# Patient Record
Sex: Female | Born: 1935
Health system: Southern US, Community
[De-identification: ages and names within clinical notes are randomized; demographics above are authoritative.]

## PROBLEM LIST (undated history)

## (undated) DIAGNOSIS — E785 Hyperlipidemia, unspecified: Secondary | ICD-10-CM

## (undated) DIAGNOSIS — M1990 Unspecified osteoarthritis, unspecified site: Secondary | ICD-10-CM

## (undated) DIAGNOSIS — I10 Essential (primary) hypertension: Secondary | ICD-10-CM

## (undated) DIAGNOSIS — I4891 Unspecified atrial fibrillation: Secondary | ICD-10-CM

## (undated) HISTORY — PX: OTHER SURGICAL HISTORY: SHX169

## (undated) HISTORY — PX: SPINE SURGERY: SHX786

## (undated) HISTORY — PX: BUNIONECTOMY: SHX129

## (undated) HISTORY — PX: CARPAL TUNNEL RELEASE: SHX101

## (undated) HISTORY — PX: TONSILLECTOMY: SUR1361

## (undated) HISTORY — PX: BREAST EXCISIONAL BIOPSY: SUR124

## (undated) HISTORY — PX: ROTATOR CUFF REPAIR: SHX139

## (undated) HISTORY — PX: BREAST BIOPSY: SHX20

## (undated) HISTORY — PX: BACK SURGERY: SHX140

## (undated) HISTORY — PX: KNEE SURGERY: SHX244

## (undated) HISTORY — PX: TOTAL KNEE ARTHROPLASTY: SHX125

---

## 2017-09-27 LAB — BASIC METABOLIC PANEL
BUN: 21 (ref 4–21)
Creatinine: 0.6 (ref 0.5–1.1)
Glucose: 88
Potassium: 3.8 (ref 3.4–5.3)
Sodium: 139 (ref 137–147)

## 2017-09-27 LAB — CBC AND DIFFERENTIAL
HCT: 43 (ref 36–46)
Hemoglobin: 13.9 (ref 12.0–16.0)
Platelets: 249 (ref 150–399)
WBC: 6.2

## 2017-09-27 LAB — HEPATIC FUNCTION PANEL
ALT: 21 (ref 7–35)
AST: 25 (ref 13–35)
Alkaline Phosphatase: 71 (ref 25–125)
Bilirubin, Total: 1.1

## 2017-09-27 LAB — LIPID PANEL
Cholesterol: 311 — AB (ref 0–200)
HDL: 85 — AB (ref 35–70)
LDL Cholesterol: 197
Triglycerides: 147 (ref 40–160)

## 2019-03-17 ENCOUNTER — Telehealth: Payer: Self-pay

## 2019-03-17 NOTE — Telephone Encounter (Signed)

## 2019-03-20 ENCOUNTER — Other Ambulatory Visit: Payer: Self-pay

## 2019-03-20 ENCOUNTER — Encounter: Payer: Self-pay | Admitting: Family Medicine

## 2019-03-20 ENCOUNTER — Ambulatory Visit (INDEPENDENT_AMBULATORY_CARE_PROVIDER_SITE_OTHER): Payer: Medicare Other | Admitting: Family Medicine

## 2019-03-20 VITALS — BP 136/80 | HR 96 | Ht 65.0 in | Wt 177.0 lb

## 2019-03-20 DIAGNOSIS — R8290 Unspecified abnormal findings in urine: Secondary | ICD-10-CM | POA: Diagnosis not present

## 2019-03-20 DIAGNOSIS — N390 Urinary tract infection, site not specified: Secondary | ICD-10-CM

## 2019-03-20 DIAGNOSIS — I10 Essential (primary) hypertension: Secondary | ICD-10-CM | POA: Insufficient documentation

## 2019-03-20 DIAGNOSIS — E7800 Pure hypercholesterolemia, unspecified: Secondary | ICD-10-CM | POA: Insufficient documentation

## 2019-03-20 MED ORDER — CHLORTHALIDONE 25 MG PO TABS: 12.5000 mg | ORAL_TABLET | Freq: Every day | ORAL | 1 refills | Status: DC

## 2019-03-20 MED ORDER — ATENOLOL 25 MG PO TABS: 25.0000 mg | ORAL_TABLET | Freq: Two times a day (BID) | ORAL | 1 refills | Status: DC

## 2019-03-20 NOTE — Progress Notes (Addendum)
Established Patient Office Visit  Subjective:  Patient ID: Kerry Coleman, female    DOB: Nov 18, 1934  Age: 84 y.o. MRN: [id number]  CC:  Chief Complaint  Patient presents with  . Establish Care    HPI Kerry Coleman presents for establishment of care.  She and her husband moved down here from [place] first part of August to be closer to their children and grandchildren.  Patient enjoys good health and continues to live a healthy lifestyle.  She is walking 2 miles daily.  She does not smoke or use illicit drugs.  Past medical history of hypertension treated with atenolol 25 mg daily and 12.5 mg of HCTZ.  Patient has undergone multiple orthopedic procedures including double knee replacements. History reviewed. No pertinent past medical history.  Past Surgical History:  Procedure Laterality Date  . BACK SURGERY    . BREAST BIOPSY    . BREAST LUMPECTOMY    . BUNIONECTOMY    . KNEE SURGERY    . right carpal tunnel release    . right index finger excision of mass    . ROTATOR CUFF REPAIR    . SPINE SURGERY    . TONSILLECTOMY      Family History  Problem Relation Age of Onset  . Heart disease Mother   . Stroke Mother   . Arthritis Father   . Heart disease Father   . Hyperlipidemia Sister   . Hypertension Sister   . Arthritis Sister   . Cancer Sister   . Hypertension Son   . Hyperlipidemia Son     Social History   Socioeconomic History  . Marital status: Married    Spouse name: Not on file  . Number of children: Not on file  . Years of education: Not on file  . Highest education level: Not on file  Occupational History  . Not on file  Social Needs  . Financial resource strain: Not on file  . Food insecurity    Worry: Not on file    Inability: Not on file  . Transportation needs    Medical: Not on file    Non-medical: Not on file  Tobacco Use  . Smoking status: Never Smoker  . Smokeless tobacco: Never Used  Substance and Sexual Activity  . Alcohol use: Yes     Alcohol/week: 7.0 standard drinks    Types: 7 Glasses of wine per week    Comment: 1 glass of red wine daily.   . Drug use: Never  . Sexual activity: Not on file  Lifestyle  . Physical activity    Days per week: Not on file    Minutes per session: Not on file  . Stress: Not on file  Relationships  . Social Musician on phone: Not on file    Gets together: Not on file    Attends religious service: Not on file    Active member of club or organization: Not on file    Attends meetings of clubs or organizations: Not on file    Relationship status: Not on file  . Intimate partner violence    Fear of current or ex partner: Not on file    Emotionally abused: Not on file    Physically abused: Not on file    Forced sexual activity: Not on file  Other Topics Concern  . Not on file  Social History Narrative  . Not on file    Outpatient Medications Prior to Visit  Medication  Sig Dispense Refill  . hydrochlorothiazide (HYDRODIURIL) 12.5 MG tablet Take 12.5 mg by mouth daily.     No facility-administered medications prior to visit.     No Known Allergies  ROS Review of Systems  Constitutional: Negative.   HENT: Negative.   Eyes: Negative for photophobia and visual disturbance.  Respiratory: Negative.   Cardiovascular: Negative.   Gastrointestinal: Negative.   Endocrine: Negative for polyphagia and polyuria.  Genitourinary: Negative.   Musculoskeletal: Negative.   Skin: Negative.   Allergic/Immunologic: Negative for immunocompromised state.  Neurological: Negative for tremors and speech difficulty.  Hematological: Negative.   Psychiatric/Behavioral: Negative.       Objective:    Physical Exam  Constitutional: She is oriented to person, place, and time. She appears well-developed and well-nourished. No distress.  HENT:  Head: Atraumatic.  Right Ear: External ear normal.  Left Ear: External ear normal.  Mouth/Throat: Oropharynx is clear and moist.  Eyes:  Pupils are equal, round, and reactive to light. Conjunctivae are normal. Right eye exhibits no discharge. Left eye exhibits no discharge. No scleral icterus.  Neck: No JVD present. No tracheal deviation present. No thyromegaly present.  Cardiovascular: Normal rate, regular rhythm and normal heart sounds.  Pulmonary/Chest: Effort normal and breath sounds normal. No stridor.  Lymphadenopathy:    She has no cervical adenopathy.  Neurological: She is alert and oriented to person, place, and time.  Skin: Skin is warm and dry. She is not diaphoretic.  Psychiatric: She has a normal mood and affect. Her behavior is normal.    BP 136/80   Pulse 96   Ht 5\' 5"  (1.651 m)   Wt 177 lb (80.3 kg)   SpO2 96%   BMI 29.45 kg/m  Wt Readings from Last 3 Encounters:  03/20/19 177 lb (80.3 kg)   BP Readings from Last 3 Encounters:  03/20/19 136/80   Guideline developer:  UpToDate (see UpToDate for funding source) Date Released: June 2014  Health Maintenance Due  Topic Date Due  . DEXA SCAN  09/15/2001    There are no preventive care reminders to display for this patient.  No results found for: TSH Lab Results  Component Value Date   WBC 6.2 03/22/2019   HGB 14.2 03/22/2019   HCT 42.6 03/22/2019   MCV 96.0 03/22/2019   PLT 236.0 03/22/2019   Lab Results  Component Value Date   NA 142 03/22/2019   K 3.9 03/22/2019   CO2 30 03/22/2019   GLUCOSE 91 03/22/2019   BUN 24 (H) 03/22/2019   CREATININE 0.68 03/22/2019   BILITOT 0.9 03/22/2019   ALKPHOS 67 03/22/2019   AST 23 03/22/2019   ALT 21 03/22/2019   PROT 7.0 03/22/2019   ALBUMIN 4.2 03/22/2019   CALCIUM 9.8 03/22/2019   GFR 82.73 03/22/2019   Lab Results  Component Value Date   CHOL 295 (H) 03/22/2019   Lab Results  Component Value Date   HDL 69.10 03/22/2019   Lab Results  Component Value Date   LDLCALC 196 (H) 03/22/2019   Lab Results  Component Value Date   TRIG 153.0 (H) 03/22/2019   Lab Results  Component Value  Date   CHOLHDL 4 03/22/2019   No results found for: HGBA1C The ASCVD Risk score Mikey Bussing DC Jr., et al., 2013) failed to calculate for the following reasons:   The 2013 ASCVD risk score is only valid for ages 98 to 1   Assessment & Plan:   Problem List Items Addressed This Visit  Cardiovascular and Mediastinum   Essential hypertension - Primary   Relevant Medications   atenolol (TENORMIN) 25 MG tablet   chlorthalidone (HYGROTON) 25 MG tablet   Other Relevant Orders   CBC (Completed)   Comprehensive metabolic panel (Completed)   Urinalysis, Routine w reflex microscopic (Completed)     Genitourinary   Urinary tract infection without hematuria   Relevant Medications   sulfamethoxazole-trimethoprim (BACTRIM DS) 800-160 MG tablet     Other   Elevated cholesterol   Relevant Medications   atenolol (TENORMIN) 25 MG tablet   chlorthalidone (HYGROTON) 25 MG tablet   Other Relevant Orders   Lipid panel (Completed)   Abnormal urinalysis   Relevant Orders   Urinalysis, Routine w reflex microscopic   Urine Culture (Completed)      Meds ordered this encounter  Medications  . atenolol (TENORMIN) 25 MG tablet    Sig: Take 1 tablet (25 mg total) by mouth 2 (two) times daily.    Dispense:  180 tablet    Refill:  1  . chlorthalidone (HYGROTON) 25 MG tablet    Sig: Take 0.5 tablets (12.5 mg total) by mouth daily.    Dispense:  30 tablet    Refill:  1  . sulfamethoxazole-trimethoprim (BACTRIM DS) 800-160 MG tablet    Sig: Take 1 tablet by mouth 2 (two) times daily for 10 days.    Dispense:  20 tablet    Refill:  0    Follow-up: Return in about 1 month (around 04/19/2019).   She will take atenolol twice daily and chlorthalidone one half daily.  Check and record her blood pressures on this new regimen.  Follow-up in 1 month.  Return fasting for above ordered blood work.

## 2019-03-21 ENCOUNTER — Telehealth: Payer: Self-pay

## 2019-03-21 NOTE — Telephone Encounter (Signed)

## 2019-03-22 ENCOUNTER — Other Ambulatory Visit: Payer: Self-pay

## 2019-03-22 ENCOUNTER — Other Ambulatory Visit (INDEPENDENT_AMBULATORY_CARE_PROVIDER_SITE_OTHER): Payer: Medicare Other

## 2019-03-22 DIAGNOSIS — I10 Essential (primary) hypertension: Secondary | ICD-10-CM | POA: Diagnosis not present

## 2019-03-22 DIAGNOSIS — E7800 Pure hypercholesterolemia, unspecified: Secondary | ICD-10-CM | POA: Diagnosis not present

## 2019-03-22 LAB — CBC
HCT: 42.6 % (ref 36.0–46.0)
Hemoglobin: 14.2 g/dL (ref 12.0–15.0)
MCHC: 33.2 g/dL (ref 30.0–36.0)
MCV: 96 fl (ref 78.0–100.0)
Platelets: 236 10*3/uL (ref 150.0–400.0)
RBC: 4.44 Mil/uL (ref 3.87–5.11)
RDW: 13.7 % (ref 11.5–15.5)
WBC: 6.2 10*3/uL (ref 4.0–10.5)

## 2019-03-22 LAB — COMPREHENSIVE METABOLIC PANEL
ALT: 21 U/L (ref 0–35)
AST: 23 U/L (ref 0–37)
Albumin: 4.2 g/dL (ref 3.5–5.2)
Alkaline Phosphatase: 67 U/L (ref 39–117)
BUN: 24 mg/dL — ABNORMAL HIGH (ref 6–23)
CO2: 30 mEq/L (ref 19–32)
Calcium: 9.8 mg/dL (ref 8.4–10.5)
Chloride: 104 mEq/L (ref 96–112)
Creatinine, Ser: 0.68 mg/dL (ref 0.40–1.20)
GFR: 82.73 mL/min (ref >60.00)
Glucose, Bld: 91 mg/dL (ref 70–99)
Potassium: 3.9 mEq/L (ref 3.5–5.1)
Sodium: 142 mEq/L (ref 135–145)
Total Bilirubin: 0.9 mg/dL (ref 0.2–1.2)
Total Protein: 7 g/dL (ref 6.0–8.3)

## 2019-03-22 LAB — URINALYSIS, ROUTINE W REFLEX MICROSCOPIC
Bilirubin Urine: NEGATIVE
Ketones, ur: NEGATIVE
Nitrite: POSITIVE — AB
Specific Gravity, Urine: 1.02 (ref 1.000–1.030)
Total Protein, Urine: NEGATIVE
Urine Glucose: NEGATIVE
Urobilinogen, UA: 0.2 (ref 0.0–1.0)
pH: 6.5 (ref 5.0–8.0)

## 2019-03-22 LAB — LIPID PANEL
Cholesterol: 295 mg/dL — ABNORMAL HIGH (ref 0–200)
HDL: 69.1 mg/dL (ref >39.00)
LDL Cholesterol: 196 mg/dL — ABNORMAL HIGH (ref 0–99)
NonHDL: 226.12
Total CHOL/HDL Ratio: 4
Triglycerides: 153 mg/dL — ABNORMAL HIGH (ref 0.0–149.0)
VLDL: 30.6 mg/dL (ref 0.0–40.0)

## 2019-03-23 DIAGNOSIS — R8290 Unspecified abnormal findings in urine: Secondary | ICD-10-CM | POA: Insufficient documentation

## 2019-03-23 NOTE — Addendum Note (Signed)
Addended by: Jon Billings on: 03/23/2019 09:20 AM   Modules accepted: Orders

## 2019-03-24 ENCOUNTER — Other Ambulatory Visit: Payer: Self-pay

## 2019-03-24 DIAGNOSIS — R8290 Unspecified abnormal findings in urine: Secondary | ICD-10-CM

## 2019-03-27 ENCOUNTER — Other Ambulatory Visit: Payer: Self-pay

## 2019-03-27 ENCOUNTER — Other Ambulatory Visit (INDEPENDENT_AMBULATORY_CARE_PROVIDER_SITE_OTHER): Payer: Medicare Other

## 2019-03-27 DIAGNOSIS — R8290 Unspecified abnormal findings in urine: Secondary | ICD-10-CM | POA: Diagnosis not present

## 2019-03-28 LAB — URINALYSIS, ROUTINE W REFLEX MICROSCOPIC
Bilirubin Urine: NEGATIVE
Ketones, ur: NEGATIVE
Nitrite: POSITIVE — AB
Specific Gravity, Urine: 1.02 (ref 1.000–1.030)
Total Protein, Urine: NEGATIVE
Urine Glucose: NEGATIVE
Urobilinogen, UA: 0.2 (ref 0.0–1.0)
pH: 6 (ref 5.0–8.0)

## 2019-03-29 ENCOUNTER — Encounter: Payer: Self-pay | Admitting: Family Medicine

## 2019-03-29 LAB — URINE CULTURE
MICRO NUMBER:: 954505
SPECIMEN QUALITY:: ADEQUATE

## 2019-03-31 DIAGNOSIS — R319 Hematuria, unspecified: Secondary | ICD-10-CM | POA: Insufficient documentation

## 2019-03-31 DIAGNOSIS — N390 Urinary tract infection, site not specified: Secondary | ICD-10-CM | POA: Insufficient documentation

## 2019-03-31 MED ORDER — SULFAMETHOXAZOLE-TRIMETHOPRIM 800-160 MG PO TABS: 1.0000 | ORAL_TABLET | Freq: Two times a day (BID) | ORAL | 0 refills | Status: AC

## 2019-03-31 NOTE — Addendum Note (Signed)
Addended by: Jon Billings on: 03/31/2019 11:57 AM   Modules accepted: Orders

## 2019-04-21 ENCOUNTER — Telehealth: Payer: Self-pay

## 2019-04-21 NOTE — Telephone Encounter (Signed)

## 2019-04-24 ENCOUNTER — Ambulatory Visit: Payer: Medicare Other | Admitting: Family Medicine

## 2019-04-27 ENCOUNTER — Ambulatory Visit (INDEPENDENT_AMBULATORY_CARE_PROVIDER_SITE_OTHER): Payer: Medicare Other | Admitting: Family Medicine

## 2019-04-27 ENCOUNTER — Encounter: Payer: Self-pay | Admitting: Family Medicine

## 2019-04-27 ENCOUNTER — Other Ambulatory Visit: Payer: Self-pay

## 2019-04-27 VITALS — BP 120/76 | HR 92 | Ht 65.0 in | Wt 179.0 lb

## 2019-04-27 DIAGNOSIS — Z1231 Encounter for screening mammogram for malignant neoplasm of breast: Secondary | ICD-10-CM

## 2019-04-27 DIAGNOSIS — Z85820 Personal history of malignant melanoma of skin: Secondary | ICD-10-CM

## 2019-04-27 DIAGNOSIS — I10 Essential (primary) hypertension: Secondary | ICD-10-CM | POA: Diagnosis not present

## 2019-04-27 DIAGNOSIS — Z0001 Encounter for general adult medical examination with abnormal findings: Secondary | ICD-10-CM | POA: Insufficient documentation

## 2019-04-27 DIAGNOSIS — Z0000 Encounter for general adult medical examination without abnormal findings: Secondary | ICD-10-CM

## 2019-04-27 DIAGNOSIS — Z01810 Encounter for preprocedural cardiovascular examination: Secondary | ICD-10-CM | POA: Insufficient documentation

## 2019-04-27 DIAGNOSIS — E7800 Pure hypercholesterolemia, unspecified: Secondary | ICD-10-CM | POA: Diagnosis not present

## 2019-04-27 DIAGNOSIS — E2839 Other primary ovarian failure: Secondary | ICD-10-CM

## 2019-04-27 DIAGNOSIS — M8588 Other specified disorders of bone density and structure, other site: Secondary | ICD-10-CM

## 2019-04-27 MED ORDER — CHLORTHALIDONE 25 MG PO TABS: 12.5000 mg | ORAL_TABLET | Freq: Every day | ORAL | 1 refills | Status: DC

## 2019-04-27 MED ORDER — PRAVASTATIN SODIUM 40 MG PO TABS: ORAL_TABLET | ORAL | 3 refills | Status: DC

## 2019-04-27 MED ORDER — ATENOLOL 25 MG PO TABS: 25.0000 mg | ORAL_TABLET | Freq: Two times a day (BID) | ORAL | 1 refills | Status: DC

## 2019-04-27 NOTE — Progress Notes (Signed)
Established Patient Office Visit  Subjective:  Patient ID: Kerry Coleman, female    DOB: September 26, 1934  Age: 84 y.o. MRN: [id number]  CC:  Chief Complaint  Patient presents with  . Follow-up    HPI Kerry BlamerMarilyn Coleman presents for follow-up of her hypertension status post changed to chlorthalidone. Blood pressures been running in the 1 20-1 40 range over the mid 60s to 70 range. She is having no issues taking the chlorthalidone. She was aware of her elevated LDL cholesterol was favorable HDL. She has tried multiple statins in the past and developed joint aches and pains while taking them. However she does admit that she has a history of multiple multiple arthritic joints and is not absolutely certain that the statin had caused a problem. Over the years she is let a healthy lifestyle with exercise healthy nutrition and she has not smoked. She has never had a vascular event. She has a history of melanoma requests a dermatological skin check.  History reviewed. No pertinent past medical history.  Past Surgical History:  Procedure Laterality Date  . BACK SURGERY    . BREAST BIOPSY    . BREAST LUMPECTOMY    . BUNIONECTOMY    . KNEE SURGERY    . right carpal tunnel release    . right index finger excision of mass    . ROTATOR CUFF REPAIR    . SPINE SURGERY    . TONSILLECTOMY      Family History  Problem Relation Age of Onset  . Heart disease Mother   . Stroke Mother   . Arthritis Father   . Heart disease Father   . Hyperlipidemia Sister   . Hypertension Sister   . Arthritis Sister   . Cancer Sister   . Hypertension Son   . Hyperlipidemia Son     Social History   Socioeconomic History  . Marital status: Married    Spouse name: Not on file  . Number of children: Not on file  . Years of education: Not on file  . Highest education level: Not on file  Occupational History  . Not on file  Social Needs  . Financial resource strain: Not on file  . Food insecurity    Worry: Not  on file    Inability: Not on file  . Transportation needs    Medical: Not on file    Non-medical: Not on file  Tobacco Use  . Smoking status: Never Smoker  . Smokeless tobacco: Never Used  Substance and Sexual Activity  . Alcohol use: Yes    Alcohol/week: 7.0 standard drinks    Types: 7 Glasses of wine per week    Comment: 1 glass of red wine daily.   . Drug use: Never  . Sexual activity: Not on file  Lifestyle  . Physical activity    Days per week: Not on file    Minutes per session: Not on file  . Stress: Not on file  Relationships  . Social Musicianconnections    Talks on phone: Not on file    Gets together: Not on file    Attends religious service: Not on file    Active member of club or organization: Not on file    Attends meetings of clubs or organizations: Not on file    Relationship status: Not on file  . Intimate partner violence    Fear of current or ex partner: Not on file    Emotionally abused: Not on file  Physically abused: Not on file    Forced sexual activity: Not on file  Other Topics Concern  . Not on file  Social History Narrative  . Not on file    Outpatient Medications Prior to Visit  Medication Sig Dispense Refill  . atenolol (TENORMIN) 25 MG tablet Take 1 tablet (25 mg total) by mouth 2 (two) times daily. 180 tablet 1  . chlorthalidone (HYGROTON) 25 MG tablet Take 0.5 tablets (12.5 mg total) by mouth daily. 30 tablet 1   No facility-administered medications prior to visit.     No Known Allergies  ROS Review of Systems  Constitutional: Negative.   HENT: Negative.   Respiratory: Negative.   Cardiovascular: Negative.   Gastrointestinal: Negative.   Endocrine: Negative for polyphagia and polyuria.  Genitourinary: Negative.   Musculoskeletal: Negative.   Skin: Negative for pallor.  Neurological: Negative.   Hematological: Negative.   Psychiatric/Behavioral: Negative.       Objective:    Physical Exam  Constitutional: She is oriented to  person, place, and time. She appears well-developed and well-nourished. No distress.  HENT:  Head: Normocephalic and atraumatic.  Right Ear: External ear normal.  Left Ear: External ear normal.  Mouth/Throat: Oropharynx is clear and moist. No oropharyngeal exudate.  Eyes: Pupils are equal, round, and reactive to light. Conjunctivae are normal. Right eye exhibits no discharge. Left eye exhibits no discharge. No scleral icterus.  Neck: Neck supple. No JVD present. No tracheal deviation present. No thyromegaly present.  Cardiovascular: Normal rate, regular rhythm and normal heart sounds.  Pulmonary/Chest: Effort normal and breath sounds normal. No stridor.  Musculoskeletal:        General: No edema.  Lymphadenopathy:    She has no cervical adenopathy.  Neurological: She is alert and oriented to person, place, and time.  Skin: Skin is warm and dry. She is not diaphoretic.  Psychiatric: She has a normal mood and affect. Her behavior is normal.    BP 120/76   Pulse 92   Ht 5\' 5"  (1.651 m)   Wt 179 lb (81.2 kg)   SpO2 97%   BMI 29.79 kg/m  Wt Readings from Last 3 Encounters:  04/27/19 179 lb (81.2 kg)  03/20/19 177 lb (80.3 kg)   BP Readings from Last 3 Encounters:  04/27/19 120/76  03/20/19 136/80   Guideline developer:  UpToDate (see UpToDate for funding source) Date Released: June 2014  Health Maintenance Due  Topic Date Due  . DEXA SCAN  09/15/2001    There are no preventive care reminders to display for this patient.   The ASCVD Risk score Mikey Bussing DC Jr., et al., 2013) failed to calculate for the following reasons:   The 2013 ASCVD risk score is only valid for ages 37 to 71  No results found for: TSH Lab Results  Component Value Date   WBC 6.2 03/22/2019   HGB 14.2 03/22/2019   HCT 42.6 03/22/2019   MCV 96.0 03/22/2019   PLT 236.0 03/22/2019   Lab Results  Component Value Date   NA 142 03/22/2019   K 3.9 03/22/2019   CO2 30 03/22/2019   GLUCOSE 91 03/22/2019    BUN 24 (H) 03/22/2019   CREATININE 0.68 03/22/2019   BILITOT 0.9 03/22/2019   ALKPHOS 67 03/22/2019   AST 23 03/22/2019   ALT 21 03/22/2019   PROT 7.0 03/22/2019   ALBUMIN 4.2 03/22/2019   CALCIUM 9.8 03/22/2019   GFR 82.73 03/22/2019   Lab Results  Component Value  Date   CHOL 295 (H) 03/22/2019   Lab Results  Component Value Date   HDL 69.10 03/22/2019   Lab Results  Component Value Date   LDLCALC 196 (H) 03/22/2019   Lab Results  Component Value Date   TRIG 153.0 (H) 03/22/2019   Lab Results  Component Value Date   CHOLHDL 4 03/22/2019   No results found for: HGBA1C    Assessment & Plan:   Problem List Items Addressed This Visit      Cardiovascular and Mediastinum   Essential hypertension - Primary   Relevant Medications   chlorthalidone (HYGROTON) 25 MG tablet   atenolol (TENORMIN) 25 MG tablet   pravastatin (PRAVACHOL) 40 MG tablet     Other   Elevated cholesterol   Relevant Medications   chlorthalidone (HYGROTON) 25 MG tablet   atenolol (TENORMIN) 25 MG tablet   pravastatin (PRAVACHOL) 40 MG tablet   History of melanoma   Relevant Orders   Ambulatory referral to Dermatology   Encounter for health maintenance examination with abnormal findings   Estrogen deficiency   Relevant Orders   DG Bone Density      Meds ordered this encounter  Medications  . chlorthalidone (HYGROTON) 25 MG tablet    Sig: Take 0.5 tablets (12.5 mg total) by mouth daily.    Dispense:  100 tablet    Refill:  1  . atenolol (TENORMIN) 25 MG tablet    Sig: Take 1 tablet (25 mg total) by mouth 2 (two) times daily.    Dispense:  180 tablet    Refill:  1  . pravastatin (PRAVACHOL) 40 MG tablet    Sig: Take one at night as directed.    Dispense:  90 tablet    Refill:  3    Follow-up: Return in about 6 months (around 10/25/2019).   She will get probably call a trial. She will take it once weekly at night for a week and then try to increase it to twice weekly. Recheck in 6  months.

## 2019-05-08 ENCOUNTER — Encounter (HOSPITAL_BASED_OUTPATIENT_CLINIC_OR_DEPARTMENT_OTHER): Payer: Self-pay

## 2019-05-08 ENCOUNTER — Ambulatory Visit (HOSPITAL_BASED_OUTPATIENT_CLINIC_OR_DEPARTMENT_OTHER)
Admission: RE | Admit: 2019-05-08 | Discharge: 2019-05-08 | Disposition: A | Discharge status: Home / Self Care | Payer: Medicare Other | Source: Ambulatory Visit | Attending: Family Medicine | Admitting: Family Medicine

## 2019-05-08 ENCOUNTER — Other Ambulatory Visit: Payer: Self-pay

## 2019-05-08 DIAGNOSIS — E2839 Other primary ovarian failure: Secondary | ICD-10-CM

## 2019-05-08 DIAGNOSIS — Z1231 Encounter for screening mammogram for malignant neoplasm of breast: Secondary | ICD-10-CM

## 2019-05-08 DIAGNOSIS — M8588 Other specified disorders of bone density and structure, other site: Secondary | ICD-10-CM | POA: Insufficient documentation

## 2019-05-08 MED ORDER — VITAMIN D-3 25 MCG (1000 UT) PO CAPS: ORAL_CAPSULE | ORAL | 2 refills | Status: DC

## 2019-05-08 MED ORDER — CALCIUM 600 MG PO TABS: 600.0000 mg | ORAL_TABLET | Freq: Two times a day (BID) | ORAL | 4 refills | Status: DC

## 2019-05-08 NOTE — Addendum Note (Signed)
Addended by: Abelino Derrick A on: 05/08/2019 04:09 PM   Modules accepted: Orders

## 2019-07-31 ENCOUNTER — Ambulatory Visit: Payer: Medicare Other | Attending: Internal Medicine

## 2019-07-31 DIAGNOSIS — Z23 Encounter for immunization: Secondary | ICD-10-CM

## 2019-07-31 NOTE — Progress Notes (Signed)
   Covid-19 Vaccination Clinic  Name:  Kerry Coleman    MRN: [id number] DOB: Mar 21, 1935  07/31/2019  Kerry Coleman was observed post Covid-19 immunization for 15 minutes without incidence. She was provided with Vaccine Information Sheet and instruction to access the V-Safe system.   Kerry Coleman was instructed to call 911 with any severe reactions post vaccine: Marland Kitchen Difficulty breathing  . Swelling of your face and throat  . A fast heartbeat  . A bad rash all over your body  . Dizziness and weakness    Immunizations Administered    Name Date Dose VIS Date Route   Pfizer COVID-19 Vaccine 07/31/2019  2:07 PM 0.3 mL 06/02/2019 Intramuscular   Manufacturer: [employer], [employer]   Lot: [id number]   NDC: [address]-9

## 2019-08-25 ENCOUNTER — Ambulatory Visit: Payer: Medicare Other | Attending: Internal Medicine

## 2019-08-25 DIAGNOSIS — Z23 Encounter for immunization: Secondary | ICD-10-CM | POA: Insufficient documentation

## 2019-08-25 NOTE — Progress Notes (Signed)
   Covid-19 Vaccination Clinic  Name:  Kerry Coleman    MRN: [id number] DOB: 1934-08-02  08/25/2019  Kerry Coleman was observed post Covid-19 immunization for 15 minutes without incident. She was provided with Vaccine Information Sheet and instruction to access the V-Safe system.   Kerry Coleman was instructed to call 911 with any severe reactions post vaccine: Marland Kitchen Difficulty breathing  . Swelling of face and throat  . A fast heartbeat  . A bad rash all over body  . Dizziness and weakness   Immunizations Administered    Name Date Dose VIS Date Route   Pfizer COVID-19 Vaccine 08/25/2019  9:02 AM 0.3 mL 06/02/2019 Intramuscular   Manufacturer: [employer], [employer]   Lot: [id number]   NDC: [address]-8

## 2019-09-27 ENCOUNTER — Telehealth: Payer: Self-pay | Admitting: Family Medicine

## 2019-09-27 NOTE — Telephone Encounter (Signed)
Left message for patient to schedule Annual Wellness Visit.  Please schedule with Nurse Health Advisor Victoria Britt, RN at [facility]  

## 2019-10-10 ENCOUNTER — Other Ambulatory Visit: Payer: Self-pay

## 2019-10-11 ENCOUNTER — Ambulatory Visit: Payer: Medicare Other | Admitting: *Deleted

## 2019-10-18 ENCOUNTER — Other Ambulatory Visit: Payer: Self-pay | Admitting: Family Medicine

## 2019-10-18 DIAGNOSIS — I10 Essential (primary) hypertension: Secondary | ICD-10-CM

## 2019-10-24 NOTE — Progress Notes (Signed)
Subjective:   Kerry Coleman is a 85 y.o. female who presents for an Initial Medicare Annual Wellness Visit.  The Patient was informed that the wellness visit is to identify future health risk and educate and initiate measures that can reduce risk for increased disease through the lifespan.   Describes health as fair, good or great? Good.   Review of Systems      Home Safety/Smoke Alarms: Feels safe in home. Smoke alarms in place.  Lives with husband in 1 story home. Walk in shower.    Female:        Mammo-  05/08/19     Dexa scan-    05/08/19    Objective:    Today's Vitals   10/25/19 0820  BP: (!) 146/68  Pulse: 75  Temp: 97.6 F (36.4 C)  TempSrc: Temporal  SpO2: 98%  Weight: 187 lb 3.2 oz (84.9 kg)  Height: 5\' 5"  (1.651 m)   Body mass index is 31.15 kg/m.  Advanced Directives 10/25/2019  Does Patient Have a Medical Advance Directive? Yes  Type of 12/25/2019 of [place];Living will  Does patient want to make changes to medical advance directive? No - Patient declined  Copy of Healthcare Power of Attorney in Chart? No - copy requested    Current Medications (verified) Outpatient Encounter Medications as of 10/25/2019  Medication Sig  . atenolol (TENORMIN) 25 MG tablet TAKE 1 TABLET(25 MG) BY MOUTH TWICE DAILY  . calcium carbonate (OS-CAL) 600 MG tablet Take 1 tablet (600 mg total) by mouth 2 (two) times daily.  . chlorthalidone (HYGROTON) 25 MG tablet Take 0.5 tablets (12.5 mg total) by mouth daily.  . Cholecalciferol (VITAMIN D-3) 25 MCG (1000 UT) CAPS Take one daily  . pravastatin (PRAVACHOL) 40 MG tablet Take one at night as directed.   No facility-administered encounter medications on file as of 10/25/2019.    Allergies (verified) Patient has no known allergies.   History: History reviewed. No pertinent past medical history. Past Surgical History:  Procedure Laterality Date  . BACK SURGERY    . BREAST BIOPSY Left    @ age 10,  Benign   . BREAST EXCISIONAL BIOPSY    . BUNIONECTOMY    . KNEE SURGERY    . right carpal tunnel release    . right index finger excision of mass    . ROTATOR CUFF REPAIR    . SPINE SURGERY    . TONSILLECTOMY     Family History  Problem Relation Age of Onset  . Heart disease Mother   . Stroke Mother   . Arthritis Father   . Heart disease Father   . Hyperlipidemia Sister   . Hypertension Sister   . Arthritis Sister   . Cancer Sister   . Hypertension Son   . Hyperlipidemia Son    Social History   Socioeconomic History  . Marital status: Married    Spouse name: Not on file  . Number of children: Not on file  . Years of education: Not on file  . Highest education level: Not on file  Occupational History  . Not on file  Tobacco Use  . Smoking status: Never Smoker  . Smokeless tobacco: Never Used  Substance and Sexual Activity  . Alcohol use: Yes    Alcohol/week: 7.0 standard drinks    Types: 7 Glasses of wine per week    Comment: 1 glass of red wine daily.   . Drug use: Never  .  Sexual activity: Not on file  Other Topics Concern  . Not on file  Social History Narrative  . Not on file   Social Determinants of Health   Financial Resource Strain: Low Risk   . Difficulty of Paying Living Expenses: Not hard at all  Food Insecurity: No Food Insecurity  . Worried About Programme researcher, broadcasting/film/video in the Last Year: Never true  . Ran Out of Food in the Last Year: Never true  Transportation Needs: No Transportation Needs  . Lack of Transportation (Medical): No  . Lack of Transportation (Non-Medical): No  Physical Activity:   . Days of Exercise per Week:   . Minutes of Exercise per Session:   Stress:   . Feeling of Stress :   Social Connections:   . Frequency of Communication with Friends and Family:   . Frequency of Social Gatherings with Friends and Family:   . Attends Religious Services:   . Active Member of Clubs or Organizations:   . Attends Banker  Meetings:   Marland Kitchen Marital Status:     Tobacco Counseling Counseling given: Not Answered   Clinical Intake:     Pain : No/denies pain                  Activities of Daily Living In your present state of health, do you have any difficulty performing the following activities: 10/25/2019  Hearing? Y  Comment wears hearing aids.  Vision? N  Difficulty concentrating or making decisions? N  Walking or climbing stairs? N  Dressing or bathing? N  Doing errands, shopping? N  Preparing Food and eating ? N  Using the Toilet? N  In the past six months, have you accidently leaked urine? N  Do you have problems with loss of bowel control? N  Managing your Medications? N  Managing your Finances? N  Housekeeping or managing your Housekeeping? N     Immunizations and Health Maintenance Immunization History  Administered Date(s) Administered  . Fluad Quad(high Dose 65+) 03/07/2019  . PFIZER SARS-COV-2 Vaccination 07/31/2019, 08/25/2019  . Pneumococcal Conjugate-13 04/16/2014  . Pneumococcal Polysaccharide-23 06/25/1994, 06/26/2005  . Tdap 05/11/2014  . Zoster 04/06/2008  . Zoster Recombinat (Shingrix) 07/25/2017, 03/10/2018   There are no preventive care reminders to display for this patient.  Patient Care Team: Mliss Sax, MD as PCP - General (Family Medicine)  Indicate any recent Medical Services you may have received from other than [facility] providers in the past year (date may be approximate).     Assessment:   This is a routine wellness examination for Kerry Coleman. Physical assessment deferred to PCP.  Hearing/Vision screen  Hearing Screening   125Hz  250Hz  500Hz  1000Hz  2000Hz  3000Hz  4000Hz  6000Hz  8000Hz   Right ear:           Left ear:           Comments: Wears hearing aids. Declines hearing screening today.    Visual Acuity Screening   Right eye Left eye Both eyes  Without correction:     With correction: 20/60 20/40 20/30     Dietary issues and exercise  activities discussed: Current Exercise Habits: Home exercise routine, Type of exercise: strength training/weights;walking, Time (Minutes): 30, Frequency (Times/Week): 7, Weekly Exercise (Minutes/Week): 210, Intensity: Mild, Exercise limited by: None identified Diet (meal preparation, eat out, water intake, caffeinated beverages, dairy products, fruits and vegetables): 24 hr recall Breakfast: cereal w/ fruit Lunch: quesadilla w/ chicken, cheese, lettuce and apple Dinner:   Baked fish,  green beans, pototoes.   Goals    . Continue healthy diet and exercise      Depression Screen PHQ 2/9 Scores 10/25/2019  PHQ - 2 Score 0    Fall Risk Fall Risk  10/25/2019  Falls in the past year? 0  Number falls in past yr: 0  Injury with Fall? 0  Follow up Education provided;Falls prevention discussed     Cognitive Function: Ad8 score reviewed for issues:  Issues making decisions:no  Less interest in hobbies / activities:no  Repeats questions, stories (family complaining):no  Trouble using ordinary gadgets (microwave, computer, phone):no  Forgets the month or year: no  Mismanaging finances: no  Remembering appts:no  Daily problems with thinking and/or memory:no Ad8 score is=0        Screening Tests Health Maintenance  Topic Date Due  . INFLUENZA VACCINE  01/21/2020  . TETANUS/TDAP  05/11/2024  . DEXA SCAN  Completed  . COVID-19 Vaccine  Completed  . PNA vac Low Risk Adult  Completed     Plan:     Please schedule your next medicare wellness visit with me in 1 yr.  Continue to eat heart healthy diet (full of fruits, vegetables, whole grains, lean protein, water--limit salt, fat, and sugar intake) and increase physical activity as tolerated.  Continue doing brain stimulating activities (puzzles, reading, adult coloring books, staying active) to keep memory sharp.   Bring a copy of your living will and/or healthcare power of attorney to your next office visit.   I have  personally reviewed and noted the following in the patient's chart:   . Medical and social history . Use of alcohol, tobacco or illicit drugs  . Current medications and supplements . Functional ability and status . Nutritional status . Physical activity . Advanced directives . List of other physicians . Hospitalizations, surgeries, and ER visits in previous 12 months . Vitals . Screenings to include cognitive, depression, and falls . Referrals and appointments  In addition, I have reviewed and discussed with patient certain preventive protocols, quality metrics, and best practice recommendations. A written personalized care plan for preventive services as well as general preventive health recommendations were provided to patient.     Shela Nevin, [place]   10/25/2019

## 2019-10-25 ENCOUNTER — Other Ambulatory Visit: Payer: Self-pay

## 2019-10-25 ENCOUNTER — Ambulatory Visit (INDEPENDENT_AMBULATORY_CARE_PROVIDER_SITE_OTHER): Payer: Medicare Other | Admitting: *Deleted

## 2019-10-25 ENCOUNTER — Encounter: Payer: Self-pay | Admitting: *Deleted

## 2019-10-25 VITALS — BP 146/68 | HR 75 | Temp 97.6°F | Ht 65.0 in | Wt 187.2 lb

## 2019-10-25 DIAGNOSIS — Z0000 Encounter for general adult medical examination without abnormal findings: Secondary | ICD-10-CM | POA: Diagnosis not present

## 2019-10-25 NOTE — Patient Instructions (Signed)
Please schedule your next medicare wellness visit with me in 1 yr.  Continue to eat heart healthy diet (full of fruits, vegetables, whole grains, lean protein, water--limit salt, fat, and sugar intake) and increase physical activity as tolerated.  Continue doing brain stimulating activities (puzzles, reading, adult coloring books, staying active) to keep memory sharp.   Bring a copy of your living will and/or healthcare power of attorney to your next office visit.   Kerry Coleman , Thank you for taking time to come for your Medicare Wellness Visit. I appreciate your ongoing commitment to your health goals. Please review the following plan we discussed and let me know if I can assist you in the future.   These are the goals we discussed: Goals    . Continue healthy diet and exercise       This is a list of the screening recommended for you and due dates:  Health Maintenance  Topic Date Due  . Flu Shot  01/21/2020  . Tetanus Vaccine  05/11/2024  . DEXA scan (bone density measurement)  Completed  . COVID-19 Vaccine  Completed  . Pneumonia vaccines  Completed    Preventive Care 83 Years and Older, Female Preventive care refers to lifestyle choices and visits with your health care provider that can promote health and wellness. This includes:  A yearly physical exam. This is also called an annual well check.  Regular dental and eye exams.  Immunizations.  Screening for certain conditions.  Healthy lifestyle choices, such as diet and exercise. What can I expect for my preventive care visit? Physical exam Your health care provider will check:  Height and weight. These may be used to calculate body mass index (BMI), which is a measurement that tells if you are at a healthy weight.  Heart rate and blood pressure.  Your skin for abnormal spots. Counseling Your health care provider may ask you questions about:  Alcohol, tobacco, and drug use.  Emotional well-being.  Home and  relationship well-being.  Sexual activity.  Eating habits.  History of falls.  Memory and ability to understand (cognition).  Work and work Statistician.  Pregnancy and menstrual history. What immunizations do I need?  Influenza (flu) vaccine  This is recommended every year. Tetanus, diphtheria, and pertussis (Tdap) vaccine  You may need a Td booster every 10 years. Varicella (chickenpox) vaccine  You may need this vaccine if you have not already been vaccinated. Zoster (shingles) vaccine  You may need this after age 83. Pneumococcal conjugate (PCV13) vaccine  One dose is recommended after age 83. Pneumococcal polysaccharide (PPSV23) vaccine  One dose is recommended after age 65. Measles, mumps, and rubella (MMR) vaccine  You may need at least one dose of MMR if you were born in 1957 or later. You may also need a second dose. Meningococcal conjugate (MenACWY) vaccine  You may need this if you have certain conditions. Hepatitis A vaccine  You may need this if you have certain conditions or if you travel or work in places where you may be exposed to hepatitis A. Hepatitis B vaccine  You may need this if you have certain conditions or if you travel or work in places where you may be exposed to hepatitis B. Haemophilus influenzae type b (Hib) vaccine  You may need this if you have certain conditions. You may receive vaccines as individual doses or as more than one vaccine together in one shot (combination vaccines). Talk with your health care provider about the risks  and benefits of combination vaccines. What tests do I need? Blood tests  Lipid and cholesterol levels. These may be checked every 5 years, or more frequently depending on your overall health.  Hepatitis C test.  Hepatitis B test. Screening  Lung cancer screening. You may have this screening every year starting at age 83 if you have a 30-pack-year history of smoking and currently smoke or have quit  within the past 15 years.  Colorectal cancer screening. All adults should have this screening starting at age 83 and continuing until age 67. Your health care provider may recommend screening at age 70 if you are at increased risk. You will have tests every 1-10 years, depending on your results and the type of screening test.  Diabetes screening. This is done by checking your blood sugar (glucose) after you have not eaten for a while (fasting). You may have this done every 1-3 years.  Mammogram. This may be done every 1-2 years. Talk with your health care provider about how often you should have regular mammograms.  BRCA-related cancer screening. This may be done if you have a family history of breast, ovarian, tubal, or peritoneal cancers. Other tests  Sexually transmitted disease (STD) testing.  Bone density scan. This is done to screen for osteoporosis. You may have this done starting at age 83 Follow these instructions at home: Eating and drinking  Eat a diet that includes fresh fruits and vegetables, whole grains, lean protein, and low-fat dairy products. Limit your intake of foods with high amounts of sugar, saturated fats, and salt.  Take vitamin and mineral supplements as recommended by your health care provider.  Do not drink alcohol if your health care provider tells you not to drink.  If you drink alcohol: ? Limit how much you have to 0-1 drink a day. ? Be aware of how much alcohol is in your drink. In the [place] one drink equals one 12 oz bottle of beer (355 mL), one 5 oz glass of wine (148 mL), or one 1 oz glass of hard liquor (44 mL). Lifestyle  Take daily care of your teeth and gums.  Stay active. Exercise for at least 30 minutes on 5 or more days each week.  Do not use any products that contain nicotine or tobacco, such as cigarettes, e-cigarettes, and chewing tobacco. If you need help quitting, ask your health care provider.  If you are sexually active, practice  safe sex. Use a condom or other form of protection in order to prevent STIs (sexually transmitted infections).  Talk with your health care provider about taking a low-dose aspirin or statin. What's next?  Go to your health care provider once a year for a well check visit.  Ask your health care provider how often you should have your eyes and teeth checked.  Stay up to date on all vaccines. This information is not intended to replace advice given to you by your health care provider. Make sure you discuss any questions you have with your health care provider. Document Revised: 06/02/2018 Document Reviewed: 06/02/2018 Elsevier Patient Education  2020 [employer].

## 2019-10-26 ENCOUNTER — Encounter: Payer: Self-pay | Admitting: Family Medicine

## 2019-10-26 ENCOUNTER — Ambulatory Visit (INDEPENDENT_AMBULATORY_CARE_PROVIDER_SITE_OTHER): Payer: Medicare Other | Admitting: Family Medicine

## 2019-10-26 VITALS — BP 156/78 | HR 69 | Temp 97.5°F | Ht 65.0 in | Wt 184.6 lb

## 2019-10-26 DIAGNOSIS — N393 Stress incontinence (female) (male): Secondary | ICD-10-CM | POA: Diagnosis not present

## 2019-10-26 DIAGNOSIS — E559 Vitamin D deficiency, unspecified: Secondary | ICD-10-CM | POA: Insufficient documentation

## 2019-10-26 DIAGNOSIS — I10 Essential (primary) hypertension: Secondary | ICD-10-CM | POA: Diagnosis not present

## 2019-10-26 DIAGNOSIS — E7800 Pure hypercholesterolemia, unspecified: Secondary | ICD-10-CM | POA: Diagnosis not present

## 2019-10-26 LAB — URINALYSIS, ROUTINE W REFLEX MICROSCOPIC
Bilirubin Urine: NEGATIVE
Ketones, ur: NEGATIVE
Leukocytes,Ua: NEGATIVE
Nitrite: NEGATIVE
Specific Gravity, Urine: 1.02 (ref 1.000–1.030)
Total Protein, Urine: NEGATIVE
Urine Glucose: NEGATIVE
Urobilinogen, UA: 0.2 (ref 0.0–1.0)
pH: 6.5 (ref 5.0–8.0)

## 2019-10-26 LAB — LIPID PANEL
Cholesterol: 242 mg/dL — ABNORMAL HIGH (ref 0–200)
HDL: 70.2 mg/dL (ref >39.00)
LDL Cholesterol: 145 mg/dL — ABNORMAL HIGH (ref 0–99)
NonHDL: 171.5
Total CHOL/HDL Ratio: 3
Triglycerides: 132 mg/dL (ref 0.0–149.0)
VLDL: 26.4 mg/dL (ref 0.0–40.0)

## 2019-10-26 LAB — COMPREHENSIVE METABOLIC PANEL
ALT: 19 U/L (ref 0–35)
AST: 23 U/L (ref 0–37)
Albumin: 4.4 g/dL (ref 3.5–5.2)
Alkaline Phosphatase: 71 U/L (ref 39–117)
BUN: 22 mg/dL (ref 6–23)
CO2: 30 mEq/L (ref 19–32)
Calcium: 10.2 mg/dL (ref 8.4–10.5)
Chloride: 100 mEq/L (ref 96–112)
Creatinine, Ser: 0.69 mg/dL (ref 0.40–1.20)
GFR: 81.23 mL/min (ref >60.00)
Glucose, Bld: 107 mg/dL — ABNORMAL HIGH (ref 70–99)
Potassium: 3.9 mEq/L (ref 3.5–5.1)
Sodium: 136 mEq/L (ref 135–145)
Total Bilirubin: 1.3 mg/dL — ABNORMAL HIGH (ref 0.2–1.2)
Total Protein: 6.8 g/dL (ref 6.0–8.3)

## 2019-10-26 LAB — CBC
HCT: 40.7 % (ref 36.0–46.0)
Hemoglobin: 13.7 g/dL (ref 12.0–15.0)
MCHC: 33.6 g/dL (ref 30.0–36.0)
MCV: 95.9 fl (ref 78.0–100.0)
Platelets: 211 10*3/uL (ref 150.0–400.0)
RBC: 4.25 Mil/uL (ref 3.87–5.11)
RDW: 13.3 % (ref 11.5–15.5)
WBC: 5.8 10*3/uL (ref 4.0–10.5)

## 2019-10-26 LAB — LDL CHOLESTEROL, DIRECT: Direct LDL: 136 mg/dL

## 2019-10-26 LAB — VITAMIN D 25 HYDROXY (VIT D DEFICIENCY, FRACTURES): VITD: 52.36 ng/mL (ref 30.00–100.00)

## 2019-10-26 MED ORDER — METOPROLOL SUCCINATE ER 50 MG PO TB24: 50.0000 mg | ORAL_TABLET | Freq: Every day | ORAL | 3 refills | Status: DC

## 2019-10-26 NOTE — Progress Notes (Addendum)
Established Patient Office Visit  Subjective:  Patient ID: Kerry Coleman, female    DOB: 1935/03/23  Age: 85 y.o. MRN: [id number]  CC:  Chief Complaint  Patient presents with  . Follow-up    6 month follow up on BP, patient concerns about bladder incontinence also would like to talk about medications.     HPI Kerry Coleman presents for follow-up of her hypertension, elevated cholesterol and urinary incontinence.  Blood pressure has been running in the upper 130s to 140s over 80s at home.  No issues with Pravachol.  Patient denies dysuria, urgency or frequency but she does tend to lose some urine when she stands up.  Denies nocturnal enuresis.  History reviewed. No pertinent past medical history.  Past Surgical History:  Procedure Laterality Date  . BACK SURGERY    . BREAST BIOPSY Left    @ age 89, Benign   . BREAST EXCISIONAL BIOPSY    . BUNIONECTOMY    . KNEE SURGERY    . right carpal tunnel release    . right index finger excision of mass    . ROTATOR CUFF REPAIR    . SPINE SURGERY    . TONSILLECTOMY      Family History  Problem Relation Age of Onset  . Heart disease Mother   . Stroke Mother   . Arthritis Father   . Heart disease Father   . Hyperlipidemia Sister   . Hypertension Sister   . Arthritis Sister   . Cancer Sister   . Hypertension Son   . Hyperlipidemia Son     Social History   Socioeconomic History  . Marital status: Married    Spouse name: Not on file  . Number of children: Not on file  . Years of education: Not on file  . Highest education level: Not on file  Occupational History  . Not on file  Tobacco Use  . Smoking status: Never Smoker  . Smokeless tobacco: Never Used  Substance and Sexual Activity  . Alcohol use: Yes    Alcohol/week: 7.0 standard drinks    Types: 7 Glasses of wine per week    Comment: 1 glass of red wine daily.   . Drug use: Never  . Sexual activity: Not on file  Other Topics Concern  . Not on file  Social  History Narrative  . Not on file   Social Determinants of Health   Financial Resource Strain: Low Risk   . Difficulty of Paying Living Expenses: Not hard at all  Food Insecurity: No Food Insecurity  . Worried About Programme researcher, broadcasting/film/video in the Last Year: Never true  . Ran Out of Food in the Last Year: Never true  Transportation Needs: No Transportation Needs  . Lack of Transportation (Medical): No  . Lack of Transportation (Non-Medical): No  Physical Activity:   . Days of Exercise per Week:   . Minutes of Exercise per Session:   Stress:   . Feeling of Stress :   Social Connections:   . Frequency of Communication with Friends and Family:   . Frequency of Social Gatherings with Friends and Family:   . Attends Religious Services:   . Active Member of Clubs or Organizations:   . Attends Banker Meetings:   Marland Kitchen Marital Status:   Intimate Partner Violence:   . Fear of Current or Ex-Partner:   . Emotionally Abused:   Marland Kitchen Physically Abused:   . Sexually Abused:  Outpatient Medications Prior to Visit  Medication Sig Dispense Refill  . calcium carbonate (OS-CAL) 600 MG tablet Take 1 tablet (600 mg total) by mouth 2 (two) times daily. 180 tablet 4  . chlorthalidone (HYGROTON) 25 MG tablet Take 0.5 tablets (12.5 mg total) by mouth daily. 100 tablet 1  . Cholecalciferol (VITAMIN D-3) 25 MCG (1000 UT) CAPS Take one daily 180 capsule 2  . atenolol (TENORMIN) 25 MG tablet TAKE 1 TABLET(25 MG) BY MOUTH TWICE DAILY 180 tablet 1  . pravastatin (PRAVACHOL) 40 MG tablet Take one at night as directed. 90 tablet 3   No facility-administered medications prior to visit.    No Known Allergies  ROS Review of Systems  Constitutional: Negative.   HENT: Negative.   Eyes: Negative for photophobia and visual disturbance.  Respiratory: Negative.   Cardiovascular: Negative.   Gastrointestinal: Negative.   Endocrine: Negative for polyphagia and polyuria.  Genitourinary: Negative for  difficulty urinating, dysuria, frequency and urgency.  Skin: Negative for pallor and rash.  Allergic/Immunologic: Negative for immunocompromised state.  Neurological: Negative for light-headedness and numbness.  Hematological: Does not bruise/bleed easily.  Psychiatric/Behavioral: Negative.       Objective:    Physical Exam  Constitutional: She is oriented to person, place, and time. She appears well-developed and well-nourished. No distress.  HENT:  Head: Normocephalic and atraumatic.  Right Ear: External ear normal.  Left Ear: External ear normal.  Eyes: Conjunctivae are normal. Right eye exhibits no discharge. Left eye exhibits no discharge.  Neck: No JVD present. No tracheal deviation present. No thyromegaly present.  Cardiovascular: Normal rate, regular rhythm and normal heart sounds.  Pulmonary/Chest: Effort normal and breath sounds normal. No stridor.  Abdominal: Bowel sounds are normal. She exhibits no distension. There is no abdominal tenderness. There is no rebound, no guarding and no CVA tenderness.  Musculoskeletal:        General: No edema.  Lymphadenopathy:    She has no cervical adenopathy.  Neurological: She is alert and oriented to person, place, and time.  Skin: Skin is warm and dry. She is not diaphoretic.  Psychiatric: She has a normal mood and affect. Her behavior is normal.    BP (!) 156/78   Pulse 69   Temp (!) 97.5 F (36.4 C) (Tympanic)   Ht 5\' 5"  (1.651 m)   Wt 184 lb 9.6 oz (83.7 kg)   SpO2 95%   BMI 30.72 kg/m  Wt Readings from Last 3 Encounters:  10/26/19 184 lb 9.6 oz (83.7 kg)  10/25/19 187 lb 3.2 oz (84.9 kg)  04/27/19 179 lb (81.2 kg)     There are no preventive care reminders to display for this patient.  There are no preventive care reminders to display for this patient.  No results found for: TSH Lab Results  Component Value Date   WBC 5.8 10/26/2019   HGB 13.7 10/26/2019   HCT 40.7 10/26/2019   MCV 95.9 10/26/2019   PLT  211.0 10/26/2019   Lab Results  Component Value Date   NA 136 10/26/2019   K 3.9 10/26/2019   CO2 30 10/26/2019   GLUCOSE 107 (H) 10/26/2019   BUN 22 10/26/2019   CREATININE 0.69 10/26/2019   BILITOT 1.3 (H) 10/26/2019   ALKPHOS 71 10/26/2019   AST 23 10/26/2019   ALT 19 10/26/2019   PROT 6.8 10/26/2019   ALBUMIN 4.4 10/26/2019   CALCIUM 10.2 10/26/2019   GFR 81.23 10/26/2019   Lab Results  Component Value Date  CHOL 242 (H) 10/26/2019   Lab Results  Component Value Date   HDL 70.20 10/26/2019   Lab Results  Component Value Date   LDLCALC 145 (H) 10/26/2019   Lab Results  Component Value Date   TRIG 132.0 10/26/2019   Lab Results  Component Value Date   CHOLHDL 3 10/26/2019   No results found for: HGBA1C    Assessment & Plan:   Problem List Items Addressed This Visit      Cardiovascular and Mediastinum   Essential hypertension - Primary   Relevant Medications   metoprolol succinate (TOPROL-XL) 50 MG 24 hr tablet   pravastatin (PRAVACHOL) 80 MG tablet   Other Relevant Orders   CBC (Completed)   Comprehensive metabolic panel (Completed)     Other   Elevated cholesterol   Relevant Medications   metoprolol succinate (TOPROL-XL) 50 MG 24 hr tablet   pravastatin (PRAVACHOL) 80 MG tablet   Other Relevant Orders   Comprehensive metabolic panel (Completed)   LDL cholesterol, direct (Completed)   Lipid panel (Completed)   Stress incontinence of urine   Relevant Orders   Urinalysis, Routine w reflex microscopic (Completed)   Urine Culture (Completed)   Vitamin D deficiency   Relevant Orders   VITAMIN D 25 Hydroxy (Vit-D Deficiency, Fractures) (Completed)      Meds ordered this encounter  Medications  . metoprolol succinate (TOPROL-XL) 50 MG 24 hr tablet    Sig: Take 1 tablet (50 mg total) by mouth daily. Take with or immediately following a meal.    Dispense:  90 tablet    Refill:  3  . pravastatin (PRAVACHOL) 80 MG tablet    Sig: Take 1 tablet  (80 mg total) by mouth daily.    Dispense:  90 tablet    Refill:  3    Follow-up: Return in about 1 month (around 11/26/2019), or if symptoms worsen or fail to improve, for check and record bps. . It sounds as though she is still taking just 20 mg of the pravachol. Awaiting ldl results. Continue [place] exercises.  Have changed to once daily metoprolol.   Libby Maw, MD

## 2019-10-26 NOTE — Patient Instructions (Signed)

## 2019-10-27 LAB — URINE CULTURE
MICRO NUMBER:: 10447622
Result:: NO GROWTH
SPECIMEN QUALITY:: ADEQUATE

## 2019-10-31 MED ORDER — PRAVASTATIN SODIUM 80 MG PO TABS: 80.0000 mg | ORAL_TABLET | Freq: Every day | ORAL | 3 refills | Status: DC

## 2019-10-31 NOTE — Addendum Note (Signed)
Addended by: Andrez Grime on: 10/31/2019 08:39 AM   Modules accepted: Orders

## 2019-11-27 ENCOUNTER — Encounter: Payer: Self-pay | Admitting: Family Medicine

## 2019-11-27 ENCOUNTER — Other Ambulatory Visit: Payer: Self-pay

## 2019-11-27 ENCOUNTER — Ambulatory Visit (INDEPENDENT_AMBULATORY_CARE_PROVIDER_SITE_OTHER): Payer: Medicare Other | Admitting: Family Medicine

## 2019-11-27 VITALS — BP 146/80 | HR 84 | Temp 97.0°F | Ht 65.0 in | Wt 186.8 lb

## 2019-11-27 DIAGNOSIS — I10 Essential (primary) hypertension: Secondary | ICD-10-CM

## 2019-11-27 DIAGNOSIS — E7800 Pure hypercholesterolemia, unspecified: Secondary | ICD-10-CM

## 2019-11-27 MED ORDER — METOPROLOL SUCCINATE ER 50 MG PO TB24: 50.0000 mg | ORAL_TABLET | Freq: Every day | ORAL | 3 refills | Status: DC

## 2019-11-27 MED ORDER — CHLORTHALIDONE 25 MG PO TABS: 12.5000 mg | ORAL_TABLET | Freq: Every day | ORAL | 1 refills | Status: DC

## 2019-11-27 MED ORDER — PRAVASTATIN SODIUM 80 MG PO TABS: 80.0000 mg | ORAL_TABLET | Freq: Every day | ORAL | 3 refills | Status: DC

## 2019-11-27 NOTE — Patient Instructions (Signed)
Managing Your Hypertension Hypertension is commonly called high blood pressure. This is when the force of your blood pressing against the walls of your arteries is too strong. Arteries are blood vessels that carry blood from your heart throughout your body. Hypertension forces the heart to work harder to pump blood, and may cause the arteries to become narrow or stiff. Having untreated or uncontrolled hypertension can cause heart attack, stroke, kidney disease, and other problems. What are blood pressure readings? A blood pressure reading consists of a higher number over a lower number. Ideally, your blood pressure should be below 120/80. The first ("top") number is called the systolic pressure. It is a measure of the pressure in your arteries as your heart beats. The second ("bottom") number is called the diastolic pressure. It is a measure of the pressure in your arteries as the heart relaxes. What does my blood pressure reading mean? Blood pressure is classified into four stages. Based on your blood pressure reading, your health care provider may use the following stages to determine what type of treatment you need, if any. Systolic pressure and diastolic pressure are measured in a unit called mm Hg. Normal  Systolic pressure: below 120.  Diastolic pressure: below 80. Elevated  Systolic pressure: 120-129.  Diastolic pressure: below 80. Hypertension stage 1  Systolic pressure: 130-139.  Diastolic pressure: 80-89. Hypertension stage 2  Systolic pressure: 140 or above.  Diastolic pressure: 90 or above. What health risks are associated with hypertension? Managing your hypertension is an important responsibility. Uncontrolled hypertension can lead to:  A heart attack.  A stroke.  A weakened blood vessel (aneurysm).  Heart failure.  Kidney damage.  Eye damage.  Metabolic syndrome.  Memory and concentration problems. What changes can I make to manage my  hypertension? Hypertension can be managed by making lifestyle changes and possibly by taking medicines. Your health care provider will help you make a plan to bring your blood pressure within a normal range. Eating and drinking   Eat a diet that is high in fiber and potassium, and low in salt (sodium), added sugar, and fat. An example eating plan is called the DASH (Dietary Approaches to Stop Hypertension) diet. To eat this way: ? Eat plenty of fresh fruits and vegetables. Try to fill half of your plate at each meal with fruits and vegetables. ? Eat whole grains, such as whole wheat pasta, brown rice, or whole grain bread. Fill about one quarter of your plate with whole grains. ? Eat low-fat diary products. ? Avoid fatty cuts of meat, processed or cured meats, and poultry with skin. Fill about one quarter of your plate with lean proteins such as fish, chicken without skin, beans, eggs, and tofu. ? Avoid premade and processed foods. These tend to be higher in sodium, added sugar, and fat.  Reduce your daily sodium intake. Most people with hypertension should eat less than 1,500 mg of sodium a day.  Limit alcohol intake to no more than 1 drink a day for nonpregnant women and 2 drinks a day for men. One drink equals 12 oz of beer, 5 oz of wine, or 1 oz of hard liquor. Lifestyle  Work with your health care provider to maintain a healthy body weight, or to lose weight. Ask what an ideal weight is for you.  Get at least 30 minutes of exercise that causes your heart to beat faster (aerobic exercise) most days of the week. Activities may include walking, swimming, or biking.  Include exercise   to strengthen your muscles (resistance exercise), such as weight lifting, as part of your weekly exercise routine. Try to do these types of exercises for 30 minutes at least 3 days a week.  Do not use any products that contain nicotine or tobacco, such as cigarettes and e-cigarettes. If you need help quitting,  ask your health care provider.  Control any long-term (chronic) conditions you have, such as high cholesterol or diabetes. Monitoring  Monitor your blood pressure at home as told by your health care provider. Your personal target blood pressure may vary depending on your medical conditions, your age, and other factors.  Have your blood pressure checked regularly, as often as told by your health care provider. Working with your health care provider  Review all the medicines you take with your health care provider because there may be side effects or interactions.  Talk with your health care provider about your diet, exercise habits, and other lifestyle factors that may be contributing to hypertension.  Visit your health care provider regularly. Your health care provider can help you create and adjust your plan for managing hypertension. Will I need medicine to control my blood pressure? Your health care provider may prescribe medicine if lifestyle changes are not enough to get your blood pressure under control, and if:  Your systolic blood pressure is 130 or higher.  Your diastolic blood pressure is 80 or higher. Take medicines only as told by your health care provider. Follow the directions carefully. Blood pressure medicines must be taken as prescribed. The medicine does not work as well when you skip doses. Skipping doses also puts you at risk for problems. Contact a health care provider if:  You think you are having a reaction to medicines you have taken.  You have repeated (recurrent) headaches.  You feel dizzy.  You have swelling in your ankles.  You have trouble with your vision. Get help right away if:  You develop a severe headache or confusion.  You have unusual weakness or numbness, or you feel faint.  You have severe pain in your chest or abdomen.  You vomit repeatedly.  You have trouble breathing. Summary  Hypertension is when the force of blood pumping  through your arteries is too strong. If this condition is not controlled, it may put you at risk for serious complications.  Your personal target blood pressure may vary depending on your medical conditions, your age, and other factors. For most people, a normal blood pressure is less than 120/80.  Hypertension is managed by lifestyle changes, medicines, or both. Lifestyle changes include weight loss, eating a healthy, low-sodium diet, exercising more, and limiting alcohol. This information is not intended to replace advice given to you by your health care provider. Make sure you discuss any questions you have with your health care provider. Document Revised: 09/30/2018 Document Reviewed: 05/06/2016 Elsevier Patient Education  2020 [employer]  Preventing High Cholesterol Cholesterol is a white, waxy substance similar to fat that the human body needs to help build cells. The liver makes all the cholesterol that a person's body needs. Having high cholesterol (hypercholesterolemia) increases a person's risk for heart disease and stroke. Extra (excess) cholesterol comes from the food the person eats. High cholesterol can often be prevented with diet and lifestyle changes. If you already have high cholesterol, you can control it with diet and lifestyle changes and with medicine. How can high cholesterol affect me? If you have high cholesterol, deposits (plaques) may build up on the   walls of your arteries. The arteries are the blood vessels that carry blood away from your heart. Plaques make the arteries narrower and stiffer. This can limit or block blood flow and cause blood clots to form. Blood clots:  Are tiny balls of cells that form in your blood.  Can move to the heart or brain, causing a heart attack or stroke. Plaques in arteries greatly increase your risk for heart attack and stroke.Making diet and lifestyle changes can reduce your risk for these conditions that may threaten your  life. What can increase my risk? This condition is more likely to develop in people who:  Eat foods that are high in saturated fat or cholesterol. Saturated fat is mostly found in: ? Foods that contain animal fat, such as red meat and some dairy products. ? Certain fatty foods made from plants, such as tropical oils.  Are overweight.  Are not getting enough exercise.  Have a family history of high cholesterol. What actions can I take to prevent this? Nutrition   Eat less saturated fat.  Avoid trans fats (partially hydrogenated oils). These are often found in margarine and in some baked goods, fried foods, and snacks bought in packages.  Avoid precooked or cured meat, such as sausages or meat loaves.  Avoid foods and drinks that have added sugars.  Eat more fruits, vegetables, and whole grains.  Choose healthy sources of protein, such as fish, poultry, lean cuts of red meat, beans, peas, lentils, and nuts.  Choose healthy sources of fat, such as: ? Nuts. ? Vegetable oils, especially olive oil. ? Fish that have healthy fats (omega-3 fatty acids), such as mackerel or salmon. The items listed above may not be a complete list of recommended foods and beverages. Contact a dietitian for more information. Lifestyle  Lose weight if you are overweight. Losing 5-10 lb (2.3-4.5 kg) can help prevent or control high cholesterol. It can also lower your risk for diabetes and high blood pressure. Ask your health care provider to help you with a diet and exercise plan to lose weight safely.  Do not use any products that contain nicotine or tobacco, such as cigarettes, e-cigarettes, and chewing tobacco. If you need help quitting, ask your health care provider.  Limit your alcohol intake. ? Do not drink alcohol if:  Your health care provider tells you not to drink.  You are pregnant, may be pregnant, or are planning to become pregnant. ? If you drink alcohol:  Limit how much you use  to:  0-1 drink a day for women.  0-2 drinks a day for men.  Be aware of how much alcohol is in your drink. In the [place] one drink equals one 12 oz bottle of beer (355 mL), one 5 oz glass of wine (148 mL), or one 1 oz glass of hard liquor (44 mL). Activity   Get enough exercise. Each week, do at least 150 minutes of exercise that takes a medium level of effort (moderate-intensity exercise). ? This is exercise that:  Makes your heart beat faster and makes you breathe harder than usual.  Allows you to still be able to talk. ? You could exercise in short sessions several times a day or longer sessions a few times a week. For example, on 5 days each week, you could walk fast or ride your bike 3 times a day for 10 minutes each time.  Do exercises as told by your health care provider. Medicines  In addition to diet and   lifestyle changes, your health care provider may recommend medicines to help lower cholesterol. This may be a medicine to lower the amount of cholesterol your liver makes. You may need medicine if: ? Diet and lifestyle changes do not lower your cholesterol enough. ? You have high cholesterol and other risk factors for heart disease or stroke.  Take over-the-counter and prescription medicines only as told by your health care provider. General information  Manage your risk factors for high cholesterol. Talk with your health care provider about all your risk factors and how to lower your risk.  Manage other conditions that you have, such as diabetes or high blood pressure (hypertension).  Have blood tests to check your cholesterol levels at regular points in time as told by your health care provider.  Keep all follow-up visits as told by your health care provider. This is important. Where to find more information  American Heart Association: www.heart.org  National Heart, Lung, and Blood Institute: www.nhlbi.nih.gov Summary  High cholesterol increases your risk for  heart disease and stroke. By keeping your cholesterol level low, you can reduce your risk for these conditions.  High cholesterol can often be prevented with diet and lifestyle changes.  Work with your health care provider to manage your risk factors, and have your blood tested regularly. This information is not intended to replace advice given to you by your health care provider. Make sure you discuss any questions you have with your health care provider. Document Revised: 09/30/2018 Document Reviewed: 02/15/2016 Elsevier Patient Education  2020 [employer] Inc.  

## 2019-11-27 NOTE — Progress Notes (Signed)
Established Patient Office Visit  Subjective:  Patient ID: Kerry Coleman, female    DOB: 1934-06-25  Age: 85 y.o. MRN: [id number]  CC:  Chief Complaint  Patient presents with  . Follow-up    1 month follow up on medications, patient states that sometimes she does feel a little unsettled when taking medications.     HPI Kerry Coleman presents for follow-up of her hypertension and elevated cholesterol.  Is felt a little nervous with the metoprolol perhaps.  Blood pressure has been looking good at home in the 120 to upper 130s over 60s to 80.  Tolerating the higher dose of pravastatin without issue.  History reviewed. No pertinent past medical history.  Past Surgical History:  Procedure Laterality Date  . BACK SURGERY    . BREAST BIOPSY Left    @ age 72, Benign   . BREAST EXCISIONAL BIOPSY    . BUNIONECTOMY    . KNEE SURGERY    . right carpal tunnel release    . right index finger excision of mass    . ROTATOR CUFF REPAIR    . SPINE SURGERY    . TONSILLECTOMY      Family History  Problem Relation Age of Onset  . Heart disease Mother   . Stroke Mother   . Arthritis Father   . Heart disease Father   . Hyperlipidemia Sister   . Hypertension Sister   . Arthritis Sister   . Cancer Sister   . Hypertension Son   . Hyperlipidemia Son     Social History   Socioeconomic History  . Marital status: Married    Spouse name: Not on file  . Number of children: Not on file  . Years of education: Not on file  . Highest education level: Not on file  Occupational History  . Not on file  Tobacco Use  . Smoking status: Never Smoker  . Smokeless tobacco: Never Used  Substance and Sexual Activity  . Alcohol use: Yes    Alcohol/week: 7.0 standard drinks    Types: 7 Glasses of wine per week    Comment: 1 glass of red wine daily.   . Drug use: Never  . Sexual activity: Not on file  Other Topics Concern  . Not on file  Social History Narrative  . Not on file   Social  Determinants of Health   Financial Resource Strain: Low Risk   . Difficulty of Paying Living Expenses: Not hard at all  Food Insecurity: No Food Insecurity  . Worried About Charity fundraiser in the Last Year: Never true  . Ran Out of Food in the Last Year: Never true  Transportation Needs: No Transportation Needs  . Lack of Transportation (Medical): No  . Lack of Transportation (Non-Medical): No  Physical Activity:   . Days of Exercise per Week:   . Minutes of Exercise per Session:   Stress:   . Feeling of Stress :   Social Connections:   . Frequency of Communication with Friends and Family:   . Frequency of Social Gatherings with Friends and Family:   . Attends Religious Services:   . Active Member of Clubs or Organizations:   . Attends Archivist Meetings:   Marland Kitchen Marital Status:   Intimate Partner Violence:   . Fear of Current or Ex-Partner:   . Emotionally Abused:   Marland Kitchen Physically Abused:   . Sexually Abused:     Outpatient Medications Prior to Visit  Medication  Sig Dispense Refill  . calcium carbonate (OS-CAL) 600 MG tablet Take 1 tablet (600 mg total) by mouth 2 (two) times daily. 180 tablet 4  . Cholecalciferol (VITAMIN D-3) 25 MCG (1000 UT) CAPS Take one daily 180 capsule 2  . chlorthalidone (HYGROTON) 25 MG tablet Take 0.5 tablets (12.5 mg total) by mouth daily. 100 tablet 1  . metoprolol succinate (TOPROL-XL) 50 MG 24 hr tablet Take 1 tablet (50 mg total) by mouth daily. Take with or immediately following a meal. 90 tablet 3  . pravastatin (PRAVACHOL) 80 MG tablet Take 1 tablet (80 mg total) by mouth daily. 90 tablet 3   No facility-administered medications prior to visit.    No Known Allergies  ROS Review of Systems  Constitutional: Negative.   HENT: Negative.   Eyes: Negative for photophobia and visual disturbance.  Respiratory: Negative.   Cardiovascular: Negative.   Gastrointestinal: Negative.   Endocrine: Negative for polyphagia and polyuria.    Genitourinary: Negative.   Musculoskeletal: Negative for gait problem and joint swelling.  Skin: Negative for pallor and rash.  Neurological: Negative for weakness, light-headedness and headaches.  Hematological: Negative.   Psychiatric/Behavioral: Negative.       Objective:    Physical Exam  Constitutional: She is oriented to person, place, and time. She appears well-developed and well-nourished. No distress.  HENT:  Head: Atraumatic.  Right Ear: External ear normal.  Left Ear: External ear normal.  Eyes: Conjunctivae are normal. Right eye exhibits no discharge. Left eye exhibits no discharge. No scleral icterus.  Neck: No JVD present. No tracheal deviation present. No thyromegaly present.  Cardiovascular: Normal rate, regular rhythm and normal heart sounds.  Pulmonary/Chest: Effort normal and breath sounds normal. No stridor.  Musculoskeletal:        General: No edema.  Lymphadenopathy:    She has no cervical adenopathy.  Neurological: She is alert and oriented to person, place, and time.  Skin: Skin is warm and dry. She is not diaphoretic.  Psychiatric: She has a normal mood and affect. Her behavior is normal.    BP (!) 146/80   Pulse 84   Temp (!) 97 F (36.1 C) (Tympanic)   Ht 5\' 5"  (1.651 m)   Wt 186 lb 12.8 oz (84.7 kg)   SpO2 96%   BMI 31.09 kg/m  Wt Readings from Last 3 Encounters:  11/27/19 186 lb 12.8 oz (84.7 kg)  10/26/19 184 lb 9.6 oz (83.7 kg)  10/25/19 187 lb 3.2 oz (84.9 kg)     There are no preventive care reminders to display for this patient.  There are no preventive care reminders to display for this patient.  No results found for: TSH Lab Results  Component Value Date   WBC 5.8 10/26/2019   HGB 13.7 10/26/2019   HCT 40.7 10/26/2019   MCV 95.9 10/26/2019   PLT 211.0 10/26/2019   Lab Results  Component Value Date   NA 136 10/26/2019   K 3.9 10/26/2019   CO2 30 10/26/2019   GLUCOSE 107 (H) 10/26/2019   BUN 22 10/26/2019    CREATININE 0.69 10/26/2019   BILITOT 1.3 (H) 10/26/2019   ALKPHOS 71 10/26/2019   AST 23 10/26/2019   ALT 19 10/26/2019   PROT 6.8 10/26/2019   ALBUMIN 4.4 10/26/2019   CALCIUM 10.2 10/26/2019   GFR 81.23 10/26/2019   Lab Results  Component Value Date   CHOL 242 (H) 10/26/2019   Lab Results  Component Value Date   HDL 70.20  10/26/2019   Lab Results  Component Value Date   LDLCALC 145 (H) 10/26/2019   Lab Results  Component Value Date   TRIG 132.0 10/26/2019   Lab Results  Component Value Date   CHOLHDL 3 10/26/2019   No results found for: HGBA1C    Assessment & Plan:   Problem List Items Addressed This Visit      Cardiovascular and Mediastinum   Essential hypertension - Primary   Relevant Medications   chlorthalidone (HYGROTON) 25 MG tablet   metoprolol succinate (TOPROL-XL) 50 MG 24 hr tablet   pravastatin (PRAVACHOL) 80 MG tablet     Other   Elevated cholesterol   Relevant Medications   chlorthalidone (HYGROTON) 25 MG tablet   metoprolol succinate (TOPROL-XL) 50 MG 24 hr tablet   pravastatin (PRAVACHOL) 80 MG tablet      Meds ordered this encounter  Medications  . chlorthalidone (HYGROTON) 25 MG tablet    Sig: Take 0.5 tablets (12.5 mg total) by mouth daily.    Dispense:  100 tablet    Refill:  1  . metoprolol succinate (TOPROL-XL) 50 MG 24 hr tablet    Sig: Take 1 tablet (50 mg total) by mouth daily. Take with or immediately following a meal.    Dispense:  90 tablet    Refill:  3  . pravastatin (PRAVACHOL) 80 MG tablet    Sig: Take 1 tablet (80 mg total) by mouth daily.    Dispense:  90 tablet    Refill:  3    Follow-up: Return in about 3 months (around 02/27/2020).  Continue current medications.  Check and record blood pressures intermittently.  Follow-up in 3 months or sooner if needed.  Given information on managing high cholesterol and hypertension.  Mliss Sax, MD

## 2020-02-29 ENCOUNTER — Ambulatory Visit (INDEPENDENT_AMBULATORY_CARE_PROVIDER_SITE_OTHER): Payer: Medicare Other | Admitting: Family Medicine

## 2020-02-29 ENCOUNTER — Encounter: Payer: Self-pay | Admitting: Family Medicine

## 2020-02-29 ENCOUNTER — Other Ambulatory Visit: Payer: Self-pay

## 2020-02-29 VITALS — BP 128/74 | HR 77 | Temp 97.1°F | Ht 65.0 in | Wt 182.8 lb

## 2020-02-29 DIAGNOSIS — I10 Essential (primary) hypertension: Secondary | ICD-10-CM | POA: Diagnosis not present

## 2020-02-29 DIAGNOSIS — E7800 Pure hypercholesterolemia, unspecified: Secondary | ICD-10-CM | POA: Diagnosis not present

## 2020-02-29 DIAGNOSIS — F5101 Primary insomnia: Secondary | ICD-10-CM

## 2020-02-29 LAB — COMPREHENSIVE METABOLIC PANEL
ALT: 19 U/L (ref 0–35)
AST: 24 U/L (ref 0–37)
Albumin: 4.4 g/dL (ref 3.5–5.2)
Alkaline Phosphatase: 70 U/L (ref 39–117)
BUN: 18 mg/dL (ref 6–23)
CO2: 30 mEq/L (ref 19–32)
Calcium: 9.7 mg/dL (ref 8.4–10.5)
Chloride: 102 mEq/L (ref 96–112)
Creatinine, Ser: 0.69 mg/dL (ref 0.40–1.20)
GFR: 81.16 mL/min (ref >60.00)
Glucose, Bld: 102 mg/dL — ABNORMAL HIGH (ref 70–99)
Potassium: 3.9 mEq/L (ref 3.5–5.1)
Sodium: 139 mEq/L (ref 135–145)
Total Bilirubin: 1 mg/dL (ref 0.2–1.2)
Total Protein: 6.7 g/dL (ref 6.0–8.3)

## 2020-02-29 LAB — LIPID PANEL
Cholesterol: 227 mg/dL — ABNORMAL HIGH (ref 0–200)
HDL: 67.6 mg/dL (ref >39.00)
LDL Cholesterol: 135 mg/dL — ABNORMAL HIGH (ref 0–99)
NonHDL: 159.3
Total CHOL/HDL Ratio: 3
Triglycerides: 123 mg/dL (ref 0.0–149.0)
VLDL: 24.6 mg/dL (ref 0.0–40.0)

## 2020-02-29 LAB — URINALYSIS, ROUTINE W REFLEX MICROSCOPIC
Bilirubin Urine: NEGATIVE
Hgb urine dipstick: NEGATIVE
Ketones, ur: NEGATIVE
Leukocytes,Ua: NEGATIVE
Nitrite: NEGATIVE
Specific Gravity, Urine: 1.01 (ref 1.000–1.030)
Total Protein, Urine: NEGATIVE
Urine Glucose: NEGATIVE
Urobilinogen, UA: 0.2 (ref 0.0–1.0)
pH: 7 (ref 5.0–8.0)

## 2020-02-29 LAB — CBC
HCT: 40.2 % (ref 36.0–46.0)
Hemoglobin: 13.5 g/dL (ref 12.0–15.0)
MCHC: 33.7 g/dL (ref 30.0–36.0)
MCV: 95 fl (ref 78.0–100.0)
Platelets: 212 10*3/uL (ref 150.0–400.0)
RBC: 4.23 Mil/uL (ref 3.87–5.11)
RDW: 13.4 % (ref 11.5–15.5)
WBC: 5.3 10*3/uL (ref 4.0–10.5)

## 2020-02-29 LAB — LDL CHOLESTEROL, DIRECT: Direct LDL: 128 mg/dL

## 2020-02-29 MED ORDER — TRAZODONE HCL 50 MG PO TABS: 25.0000 mg | ORAL_TABLET | Freq: Every evening | ORAL | 1 refills | Status: DC | PRN

## 2020-02-29 NOTE — Patient Instructions (Addendum)
Insomnia Insomnia is a sleep disorder that makes it difficult to fall asleep or stay asleep. Insomnia can cause fatigue, low energy, difficulty concentrating, mood swings, and poor performance at work or school. There are three different ways to classify insomnia:  Difficulty falling asleep.  Difficulty staying asleep.  Waking up too early in the morning. Any type of insomnia can be long-term (chronic) or short-term (acute). Both are common. Short-term insomnia usually lasts for three months or less. Chronic insomnia occurs at least three times a week for longer than three months. What are the causes? Insomnia may be caused by another condition, situation, or substance, such as:  Anxiety.  Certain medicines.  Gastroesophageal reflux disease (GERD) or other gastrointestinal conditions.  Asthma or other breathing conditions.  Restless legs syndrome, sleep apnea, or other sleep disorders.  Chronic pain.  Menopause.  Stroke.  Abuse of alcohol, tobacco, or illegal drugs.  Mental health conditions, such as depression.  Caffeine.  Neurological disorders, such as Alzheimer's disease.  An overactive thyroid (hyperthyroidism). Sometimes, the cause of insomnia may not be known. What increases the risk? Risk factors for insomnia include:  Gender. Women are affected more often than men.  Age. Insomnia is more common as you get older.  Stress.  Lack of exercise.  Irregular work schedule or working night shifts.  Traveling between different time zones.  Certain medical and mental health conditions. What are the signs or symptoms? If you have insomnia, the main symptom is having trouble falling asleep or having trouble staying asleep. This may lead to other symptoms, such as:  Feeling fatigued or having low energy.  Feeling nervous about going to sleep.  Not feeling rested in the morning.  Having trouble concentrating.  Feeling irritable, anxious, or depressed. How  is this diagnosed? This condition may be diagnosed based on:  Your symptoms and medical history. Your health care provider may ask about: ? Your sleep habits. ? Any medical conditions you have. ? Your mental health.  A physical exam. How is this treated? Treatment for insomnia depends on the cause. Treatment may focus on treating an underlying condition that is causing insomnia. Treatment may also include:  Medicines to help you sleep.  Counseling or therapy.  Lifestyle adjustments to help you sleep better. Follow these instructions at home: Eating and drinking   Limit or avoid alcohol, caffeinated beverages, and cigarettes, especially close to bedtime. These can disrupt your sleep.  Do not eat a large meal or eat spicy foods right before bedtime. This can lead to digestive discomfort that can make it hard for you to sleep. Sleep habits   Keep a sleep diary to help you and your health care provider figure out what could be causing your insomnia. Write down: ? When you sleep. ? When you wake up during the night. ? How well you sleep. ? How rested you feel the next day. ? Any side effects of medicines you are taking. ? What you eat and drink.  Make your bedroom a dark, comfortable place where it is easy to fall asleep. ? Put up shades or blackout curtains to block light from outside. ? Use a white noise machine to block noise. ? Keep the temperature cool.  Limit screen use before bedtime. This includes: ? Watching TV. ? Using your smartphone, tablet, or computer.  Stick to a routine that includes going to bed and waking up at the same times every day and night. This can help you fall asleep faster. Consider   making a quiet activity, such as reading, part of your nighttime routine.  Try to avoid taking naps during the day so that you sleep better at night.  Get out of bed if you are still awake after 15 minutes of trying to sleep. Keep the lights down, but try reading or  doing a quiet activity. When you feel sleepy, go back to bed. General instructions  Take over-the-counter and prescription medicines only as told by your health care provider.  Exercise regularly, as told by your health care provider. Avoid exercise starting several hours before bedtime.  Use relaxation techniques to manage stress. Ask your health care provider to suggest some techniques that may work well for you. These may include: ? Breathing exercises. ? Routines to release muscle tension. ? Visualizing peaceful scenes.  Make sure that you drive carefully. Avoid driving if you feel very sleepy.  Keep all follow-up visits as told by your health care provider. This is important. Contact a health care provider if:  You are tired throughout the day.  You have trouble in your daily routine due to sleepiness.  You continue to have sleep problems, or your sleep problems get worse. Get help right away if:  You have serious thoughts about hurting yourself or someone else. If you ever feel like you may hurt yourself or others, or have thoughts about taking your own life, get help right away. You can go to your nearest emergency department or call:  Your local emergency services (911 in the [place]  A suicide crisis helpline, such as the National Suicide Prevention Lifeline at 8630551861. This is open 24 hours a day. Summary  Insomnia is a sleep disorder that makes it difficult to fall asleep or stay asleep.  Insomnia can be long-term (chronic) or short-term (acute).  Treatment for insomnia depends on the cause. Treatment may focus on treating an underlying condition that is causing insomnia.  Keep a sleep diary to help you and your health care provider figure out what could be causing your insomnia. This information is not intended to replace advice given to you by your health care provider. Make sure you discuss any questions you have with your health care provider. Document  Revised: 05/21/2017 Document Reviewed: 03/18/2017 Elsevier Patient Education  2020 [employer]  Preventing Hypertension Hypertension, commonly called high blood pressure, is when the force of blood pumping through the arteries is too strong. Arteries are blood vessels that carry blood from the heart throughout the body. Over time, hypertension can damage the arteries and decrease blood flow to important parts of the body, including the brain, heart, and kidneys. Often, hypertension does not cause symptoms until blood pressure is very high. For this reason, it is important to have your blood pressure checked on a regular basis. Hypertension can often be prevented with diet and lifestyle changes. If you already have hypertension, you can control it with diet and lifestyle changes, as well as medicine. What nutrition changes can be made? Maintain a healthy diet. This includes:  Eating less salt (sodium). Ask your health care provider how much sodium is safe for you to have. The general recommendation is to consume less than 1 tsp (2,300 mg) of sodium a day. ? Do not add salt to your food. ? Choose low-sodium options when grocery shopping and eating out.  Limiting fats in your diet. You can do this by eating low-fat or fat-free dairy products and by eating less red meat.  Eating more fruits, vegetables, and  whole grains. Make a goal to eat: ? 1-2 cups of fresh fruits and vegetables each day. ? 3-4 servings of whole grains each day.  Avoiding foods and beverages that have added sugars.  Eating fish that contain healthy fats (omega-3 fatty acids), such as mackerel or salmon. If you need help putting together a healthy eating plan, try the DASH diet. This diet is high in fruits, vegetables, and whole grains. It is low in sodium, red meat, and added sugars. DASH stands for Dietary Approaches to Stop Hypertension. What lifestyle changes can be made?   Lose weight if you are overweight. Losing  just 3?5% of your body weight can help prevent or control hypertension. ? For example, if your present weight is 200 lb (91 kg), a loss of 3-5% of your weight means losing 6-10 lb (2.7-4.5 kg). ? Ask your health care provider to help you with a diet and exercise plan to safely lose weight.  Get enough exercise. Do at least 150 minutes of moderate-intensity exercise each week. ? You could do this in short exercise sessions several times a day, or you could do longer exercise sessions a few times a week. For example, you could take a brisk 10-minute walk or bike ride, 3 times a day, for 5 days a week.  Find ways to reduce stress, such as exercising, meditating, listening to music, or taking a yoga class. If you need help reducing stress, ask your health care provider.  Do not smoke. This includes e-cigarettes. Chemicals in tobacco and nicotine products raise your blood pressure each time you smoke. If you need help quitting, ask your health care provider.  Avoid alcohol. If you drink alcohol, limit alcohol intake to no more than 1 drink a day for nonpregnant women and 2 drinks a day for men. One drink equals 12 oz of beer, 5 oz of wine, or 1 oz of hard liquor. Why are these changes important? Diet and lifestyle changes can help you prevent hypertension, and they may make you feel better overall and improve your quality of life. If you have hypertension, making these changes will help you control it and help prevent major complications, such as:  Hardening and narrowing of arteries that supply blood to: ? Your heart. This can cause a heart attack. ? Your brain. This can cause a stroke. ? Your kidneys. This can cause kidney failure.  Stress on your heart muscle, which can cause heart failure. What can I do to lower my risk?  Work with your health care provider to make a hypertension prevention plan that works for you. Follow your plan and keep all follow-up visits as told by your health care  provider.  Learn how to check your blood pressure at home. Make sure that you know your personal target blood pressure, as told by your health care provider. How is this treated? In addition to diet and lifestyle changes, your health care provider may recommend medicines to help lower your blood pressure. You may need to try a few different medicines to find what works best for you. You also may need to take more than one medicine. Take over-the-counter and prescription medicines only as told by your health care provider. Where to find support Your health care provider can help you prevent hypertension and help you keep your blood pressure at a healthy level. Your local hospital or your community may also provide support services and prevention programs. The American Heart Association offers an online support network at:  https://www.lee.net/ Where to find more information Learn more about hypertension from:  National Heart, Lung, and Blood Institute: https://www.peterson.org/  Centers for Disease Control and Prevention: AboutHD.co.nz  American Academy of Family Physicians: http://familydoctor.org/familydoctor/en/diseases-conditions/high-blood-pressure.printerview.all.html Learn more about the DASH diet from:  National Heart, Lung, and Blood Institute: WedMap.it Contact a health care provider if:  You think you are having a reaction to medicines you have taken.  You have recurrent headaches or feel dizzy.  You have swelling in your ankles.  You have trouble with your vision. Summary  Hypertension often does not cause any symptoms until blood pressure is very high. It is important to get your blood pressure checked regularly.  Diet and lifestyle changes are the most important steps in preventing hypertension.  By keeping your blood pressure in a healthy range, you can prevent  complications like heart attack, heart failure, stroke, and kidney failure.  Work with your health care provider to make a hypertension prevention plan that works for you. This information is not intended to replace advice given to you by your health care provider. Make sure you discuss any questions you have with your health care provider. Document Revised: 09/30/2018 Document Reviewed: 02/17/2016 Elsevier Patient Education  2020 [employer].

## 2020-02-29 NOTE — Progress Notes (Signed)
Established Patient Office Visit  Subjective:  Patient ID: Kerry Coleman, female    DOB: [birth year]  Age: 85 y.o. MRN: [id number]  CC:  Chief Complaint  Patient presents with  . Follow-up    3 month follow up BP, no concerns. Pt fasting for labs.     HPI Kerry Coleman presents for follow-up of her blood pressure and elevated cholesterol.  Blood pressures been looking good running in the 120-130/60-70 range.  Tolerating the higher dose of pravastatin.  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  Sometimes wakes up in the morning at around 4 and just gets up and reads.  Exercising by walking 2 miles daily and swimming 3 times a week.  She has regular dental care.  No past medical history on file.  Past Surgical History:  Procedure Laterality Date  . BACK SURGERY    . BREAST BIOPSY Left    @ age 89, Benign   . BREAST EXCISIONAL BIOPSY    . BUNIONECTOMY    . KNEE SURGERY    . right carpal tunnel release    . right index finger excision of mass    . ROTATOR CUFF REPAIR    . SPINE SURGERY    . TONSILLECTOMY      Family History  Problem Relation Age of Onset  . Heart disease Mother   . Stroke Mother   . Arthritis Father   . Heart disease Father   . Hyperlipidemia Sister   . Hypertension Sister   . Arthritis Sister   . Cancer Sister   . Hypertension Son   . Hyperlipidemia Son     Social History   Socioeconomic History  . Marital status: Married    Spouse name: Not on file  . Number of children: Not on file  . Years of education: Not on file  . Highest education level: Not on file  Occupational History  . Not on file  Tobacco Use  . Smoking status: Never Smoker  . Smokeless tobacco: Never Used  Substance and Sexual Activity  . Alcohol use: Yes    Alcohol/week: 7.0 standard drinks    Types: 7 Glasses of wine per week    Comment: 1 glass of red wine daily.   . Drug use: Never  . Sexual activity: Not on file  Other Topics Concern  . Not on file  Social History Narrative   . Not on file   Social Determinants of Health   Financial Resource Strain: Low Risk   . Difficulty of Paying Living Expenses: Not hard at all  Food Insecurity: No Food Insecurity  . Worried About Programme researcher, broadcasting/film/video in the Last Year: Never true  . Ran Out of Food in the Last Year: Never true  Transportation Needs: No Transportation Needs  . Lack of Transportation (Medical): No  . Lack of Transportation (Non-Medical): No  Physical Activity:   . Days of Exercise per Week: Not on file  . Minutes of Exercise per Session: Not on file  Stress:   . Feeling of Stress : Not on file  Social Connections:   . Frequency of Communication with Friends and Family: Not on file  . Frequency of Social Gatherings with Friends and Family: Not on file  . Attends Religious Services: Not on file  . Active Member of Clubs or Organizations: Not on file  . Attends Banker Meetings: Not on file  . Marital Status: Not on file  Intimate Partner Violence:   .  Fear of Current or Ex-Partner: Not on file  . Emotionally Abused: Not on file  . Physically Abused: Not on file  . Sexually Abused: Not on file    Outpatient Medications Prior to Visit  Medication Sig Dispense Refill  . calcium carbonate (OS-CAL) 600 MG tablet Take 1 tablet (600 mg total) by mouth 2 (two) times daily. 180 tablet 4  . chlorthalidone (HYGROTON) 25 MG tablet Take 0.5 tablets (12.5 mg total) by mouth daily. 100 tablet 1  . Cholecalciferol (VITAMIN D-3) 25 MCG (1000 UT) CAPS Take one daily 180 capsule 2  . metoprolol succinate (TOPROL-XL) 50 MG 24 hr tablet Take 1 tablet (50 mg total) by mouth daily. Take with or immediately following a meal. 90 tablet 3  . pravastatin (PRAVACHOL) 80 MG tablet Take 1 tablet (80 mg total) by mouth daily. 90 tablet 3   No facility-administered medications prior to visit.    No Known Allergies  ROS Review of Systems  Constitutional: Negative.   HENT: Negative.   Eyes: Negative for  photophobia and visual disturbance.  Respiratory: Negative.   Cardiovascular: Negative.   Gastrointestinal: Negative.   Endocrine: Negative for polyphagia and polyuria.  Genitourinary: Negative.  Negative for difficulty urinating, dysuria and frequency.  Musculoskeletal: Negative for gait problem and joint swelling.  Neurological: Negative for tremors and speech difficulty.  Hematological: Does not bruise/bleed easily.   Depression screen [facility] 2/9 10/25/2019  Decreased Interest 0  Down, Depressed, Hopeless 0  PHQ - 2 Score 0   Depression screen [facility] 2/9 02/29/2020 10/25/2019  Decreased Interest 0 0  Down, Depressed, Hopeless 0 0  PHQ - 2 Score 0 0  Altered sleeping 0 -  Tired, decreased energy 1 -  Change in appetite 1 -  Feeling bad or failure about yourself  0 -  Trouble concentrating 0 -  Moving slowly or fidgety/restless 0 -  Suicidal thoughts 0 -  PHQ-9 Score 2 -  Difficult doing work/chores Not difficult at all -      Objective:    Physical Exam Vitals and nursing note reviewed.  Constitutional:      General: She is not in acute distress.    Appearance: Normal appearance. She is not ill-appearing, toxic-appearing or diaphoretic.  HENT:     Head: Normocephalic and atraumatic.     Right Ear: Tympanic membrane, ear canal and external ear normal.     Left Ear: Tympanic membrane, ear canal and external ear normal.  Eyes:     General: No scleral icterus.       Right eye: No discharge.        Left eye: No discharge.     Conjunctiva/sclera: Conjunctivae normal.     Pupils: Pupils are equal, round, and reactive to light.  Cardiovascular:     Rate and Rhythm: Normal rate and regular rhythm.  Pulmonary:     Effort: Pulmonary effort is normal.     Breath sounds: Normal breath sounds.  Musculoskeletal:     Cervical back: No rigidity or tenderness.     Right lower leg: No edema.     Left lower leg: No edema.  Lymphadenopathy:     Cervical: No cervical adenopathy.  Skin:     General: Skin is warm and dry.  Neurological:     Mental Status: She is alert and oriented to person, place, and time.  Psychiatric:        Mood and Affect: Mood normal.  Behavior: Behavior normal.     BP 128/74   Pulse 77   Temp (!) 97.1 F (36.2 C) (Tympanic)   Ht 5\' 5"  (1.651 m)   Wt 182 lb 12.8 oz (82.9 kg)   SpO2 95%   BMI 30.42 kg/m  Wt Readings from Last 3 Encounters:  02/29/20 182 lb 12.8 oz (82.9 kg)  11/27/19 186 lb 12.8 oz (84.7 kg)  10/26/19 184 lb 9.6 oz (83.7 kg)     Health Maintenance Due  Topic Date Due  . INFLUENZA VACCINE  01/21/2020    There are no preventive care reminders to display for this patient.  No results found for: TSH Lab Results  Component Value Date   WBC 5.8 10/26/2019   HGB 13.7 10/26/2019   HCT 40.7 10/26/2019   MCV 95.9 10/26/2019   PLT 211.0 10/26/2019   Lab Results  Component Value Date   NA 136 10/26/2019   K 3.9 10/26/2019   CO2 30 10/26/2019   GLUCOSE 107 (H) 10/26/2019   BUN 22 10/26/2019   CREATININE 0.69 10/26/2019   BILITOT 1.3 (H) 10/26/2019   ALKPHOS 71 10/26/2019   AST 23 10/26/2019   ALT 19 10/26/2019   PROT 6.8 10/26/2019   ALBUMIN 4.4 10/26/2019   CALCIUM 10.2 10/26/2019   GFR 81.23 10/26/2019   Lab Results  Component Value Date   CHOL 242 (H) 10/26/2019   Lab Results  Component Value Date   HDL 70.20 10/26/2019   Lab Results  Component Value Date   LDLCALC 145 (H) 10/26/2019   Lab Results  Component Value Date   TRIG 132.0 10/26/2019   Lab Results  Component Value Date   CHOLHDL 3 10/26/2019   No results found for: HGBA1C    Assessment & Plan:   Problem List Items Addressed This Visit      Cardiovascular and Mediastinum   Essential hypertension - Primary   Relevant Orders   CBC   Comprehensive metabolic panel   LDL cholesterol, direct   Lipid panel   Urinalysis, Routine w reflex microscopic     Other   Elevated cholesterol   Relevant Orders   Comprehensive  metabolic panel    Other Visit Diagnoses    Primary insomnia       Relevant Medications   traZODone (DESYREL) 50 MG tablet      Meds ordered this encounter  Medications  . traZODone (DESYREL) 50 MG tablet    Sig: Take 0.5-1 tablets (25-50 mg total) by mouth at bedtime as needed for sleep.    Dispense:  30 tablet    Refill:  1    Follow-up: No follow-ups on file.    12/26/2019, MD

## 2020-03-05 ENCOUNTER — Other Ambulatory Visit: Payer: Self-pay

## 2020-03-05 ENCOUNTER — Ambulatory Visit (INDEPENDENT_AMBULATORY_CARE_PROVIDER_SITE_OTHER): Payer: Medicare Other

## 2020-03-05 DIAGNOSIS — Z23 Encounter for immunization: Secondary | ICD-10-CM

## 2020-03-05 NOTE — Progress Notes (Addendum)
After obtaining consent, and per orders of Dr. Doreene Burke, injection of Influenza  given by [place] in left deltoid. Patient instructed to remain in clinic for 20 minutes afterwards, and to report any adverse reaction to me immediately.

## 2020-05-13 ENCOUNTER — Ambulatory Visit: Payer: Medicare Other | Attending: Orthopedic Surgery | Admitting: Physical Therapy

## 2020-05-13 ENCOUNTER — Encounter: Payer: Self-pay | Admitting: Physical Therapy

## 2020-05-13 ENCOUNTER — Other Ambulatory Visit: Payer: Self-pay

## 2020-05-13 DIAGNOSIS — G8929 Other chronic pain: Secondary | ICD-10-CM | POA: Insufficient documentation

## 2020-05-13 DIAGNOSIS — M25562 Pain in left knee: Secondary | ICD-10-CM | POA: Diagnosis present

## 2020-05-13 DIAGNOSIS — R252 Cramp and spasm: Secondary | ICD-10-CM | POA: Diagnosis present

## 2020-05-13 DIAGNOSIS — M79605 Pain in left leg: Secondary | ICD-10-CM | POA: Insufficient documentation

## 2020-05-13 DIAGNOSIS — R600 Localized edema: Secondary | ICD-10-CM | POA: Insufficient documentation

## 2020-05-13 NOTE — Therapy (Signed)
[facility] [address] [place], [place], [address] Phone: [phone]   Fax:  [phone]  Physical Therapy Evaluation  Patient Details  Name: Kerry Coleman MRN: [id number] Date of Birth: 12/27/1934 Referring Provider (PT): Kaylyn Lim Date: 05/13/2020   PT End of Session - 05/13/20 0946    Visit Number 1    Date for PT Re-Evaluation 07/13/20    PT Start Time 0801    PT Stop Time 0843    PT Time Calculation (min) 42 min    Activity Tolerance Patient tolerated treatment well    Behavior During Therapy [facility] for tasks assessed/performed           History reviewed. No pertinent past medical history.  Past Surgical History:  Procedure Laterality Date  . BACK SURGERY    . BREAST BIOPSY Left    @ age 64, Benign   . BREAST EXCISIONAL BIOPSY    . BUNIONECTOMY    . KNEE SURGERY    . right carpal tunnel release    . right index finger excision of mass    . ROTATOR CUFF REPAIR    . SPINE SURGERY    . TONSILLECTOMY      There were no vitals filed for this visit.    Subjective Assessment - 05/13/20 0804    Subjective Pt reports that she was having LBP and lateral L leg pain for about 8 weeks. Pt reports that now she is having predominantly having pain on the outside of the L leg. Has trouble getting out of car, bending over to pick something up, lifting leg, and getting out of bed. Reports no pain with walking. Pt has hx of B TKA ~11 years ago. Pt reports mild knee swelling with excessive activity. Pt reports she had xrays which showed some "tendon damage" on middle of R knee.    Pertinent History B TKA    Limitations Standing;House hold activities    Diagnostic tests xrays    Patient Stated Goals get rid of pain; be able to do household activities    Currently in Pain? Yes    Pain Score 5     Pain Location Leg    Pain Orientation Left;Lateral    Pain Descriptors / Indicators Sharp    Pain Type Acute pain     Pain Onset More than a month ago    Pain Frequency Intermittent    Aggravating Factors  squatting, getting in car, getting out of bed    Pain Relieving Factors rest, ibuprofen              [facility] PT Assessment - 05/13/20 0001      Assessment   Medical Diagnosis L leg pain    Referring Provider (PT) Doreene Burke    Prior Therapy PT for knees and back      Precautions   Precautions None      Restrictions   Weight Bearing Restrictions No      Balance Screen   Has the patient fallen in the past 6 months No    Has the patient had a decrease in activity level because of a fear of falling?  No    Is the patient reluctant to leave their home because of a fear of falling?  No      Home Environment   Additional Comments no stairs at home; reports some difficulty with stairs      Prior Function   Level of Independence  Independent    Vocation Retired    Leisure walking      Observation/Other Assessments-Edema    Edema --   pt reports occasional swelling in L knee     Functional Tests   Functional tests Sit to Stand      Sit to Stand   Comments WFL; some pain L lateral leg with increasing reps      ROM / Strength   AROM / PROM / Strength AROM;Strength      AROM   Overall AROM Comments lumbar AROM 25% limited flexion      Strength   Overall Strength Comments 5/5 RLE, 4+/5 LLE      Flexibility   Soft Tissue Assessment /Muscle Length yes   L knee pain with hip IR/ER   Hamstrings SLR: 85 deg R, 50 deg L and painful    Quadriceps tight B    ITB tight B, L>R    Piriformis tight B      Palpation   Palpation comment pain L knee joint line, tender L quads/hamstring/ITB      Special Tests    Special Tests Hip Special Tests;Knee Special Tests    Hip Special Tests  Luisa Hart (FABER) Test;Hip Scouring    Knee Special tests  other      Luisa Hart [facility]) Test   Findings Negative    Side Left      Hip Scouring   Findings Negative    Side Left      other    Comments no notable  instability of anterior/posterior knee      Transfers   Five time sit to stand comments  WFL, mild pain reported L lateral leg with increasing reps      Ambulation/Gait   Gait Comments mild antalgic gait                      Objective measurements completed on examination: See above findings.       [facility] Adult PT Treatment/Exercise - 05/13/20 0001      Self-Care   Self-Care Other Self-Care Comments    Other Self-Care Comments  education on self TP release of L ITB with tennis ball      Exercises   Exercises Knee/Hip      Knee/Hip Exercises: Stretches   Active Hamstring Stretch Both;1 rep;30 seconds    Piriformis Stretch Both;1 rep;30 seconds    Other Knee/Hip Stretches LTR supine      Knee/Hip Exercises: Seated   Long Arc Quad Limitations x10 with ball squeeze                  PT Education - 05/13/20 0850    Education Details Pt educated on POC and HEP    Person(s) Educated Patient    Methods Explanation;Demonstration;Handout    Comprehension Verbalized understanding;Returned demonstration            PT Short Term Goals - 05/13/20 0955      PT SHORT TERM GOAL #1   Title Pt will be I with initial HEP    Time 2    Period Weeks    Status New    Target Date 05/27/20             PT Long Term Goals - 05/13/20 0955      PT LONG TERM GOAL #1   Title Pt will be I with advanced HEP    Time 6    Period Weeks  Status New    Target Date 06/24/20      PT LONG TERM GOAL #2   Title Pt will report 50% reduction in LLE pain    Time 6    Period Weeks    Status New    Target Date 06/24/20      PT LONG TERM GOAL #3   Title Pt will demo L SLR equivalent to RLE with </=2/10 LLE pain    Baseline L: 50 deg, R: 85 deg    Time 6    Period Weeks    Status New    Target Date 06/24/20      PT LONG TERM GOAL #4   Title Pt will report </= 1/10 LLE pain when getting in/out of car or bed    Time 6    Period Weeks    Status New    Target Date  06/24/20                  Plan - 05/13/20 0948    Clinical Impression Statement Pt presents to clinic with lateral L LE pain present for ~8 weeks with no known MOI. Pt has hx of LBP, lumbar fusion, and B TKA (11 years ago). Pt reports xrays showing "mild tendon damage" to L knee; will obtain from MD. Pt demos neg for knee instability testing. Does have some tenderness to L knee joint line and is experiencing increased swelling in L knee with activity. Pt very tender along L ITB, L hamstrings, and L quad with general LE tightness/flexibility deficits. Pt SLR on RLE 85 deg, LLE 50 deg and painful. Very tight in hip IR/ER on L. Pt reports pain with getting in/out of car/bed and bending to pick things up. Pt is very active and would like to return to PLOF.    Examination-Activity Limitations Bend;Squat;Stairs;Stand    Examination-Participation Restrictions Community Activity;Interpersonal Relationship    Stability/Clinical Decision Making Stable/Uncomplicated    Clinical Decision Making Low    Rehab Potential Good    PT Frequency 2x / week    PT Duration 6 weeks    PT Treatment/Interventions ADLs/Self Care Home Management;Cryotherapy;Electrical Stimulation;Moist Heat;Iontophoresis 4mg /ml Dexamethasone;Gait training;Stair training;Therapeutic activities;Therapeutic exercise;Balance training;Neuromuscular re-education;Manual techniques;Patient/family education;Passive range of motion;Dry needling;Vasopneumatic Device    PT Next Visit Plan rolling ITB, manual for LE flexibility, slowly introduce gym activities    PT Home Exercise Plan LAQ with ball squeeze, piriformis stretch, hamstring stretch, LTR, self TP release ITB w/ tennis ball    Consulted and Agree with Plan of Care Patient           Patient will benefit from skilled therapeutic intervention in order to improve the following deficits and impairments:  Abnormal gait, Decreased range of motion, Increased muscle spasms, Pain, Impaired  flexibility, Decreased strength, Increased edema  Visit Diagnosis: Pain in left leg  Cramp and spasm  Localized edema  Chronic pain of left knee     Problem List Patient Active Problem List   Diagnosis Date Noted  . Stress incontinence of urine 10/26/2019  . Vitamin D deficiency 10/26/2019  . Osteopenia of lumbar spine 05/08/2019  . History of melanoma 04/27/2019  . Encounter for health maintenance examination with abnormal findings 04/27/2019  . Estrogen deficiency 04/27/2019  . Urinary tract infection with hematuria 03/31/2019  . Urinary tract infection without hematuria 03/31/2019  . Abnormal urinalysis 03/23/2019  . Elevated cholesterol 03/20/2019  . Essential hypertension 03/20/2019   03/22/2019, PT, DPT Lysle Rubens Eulonda Andalon 05/13/2020, 9:58  AM  [facility] [address] [place], [place], [id number] Phone: [phone]   Fax:  [phone]  Name: Kerry Coleman MRN: [id number] Date of Birth: 1935/01/22

## 2020-05-13 NOTE — Patient Instructions (Signed)
Access Code: [email or web address] URL: [email or web address] Date: 05/13/2020 Prepared by: Lysle Rubens  Exercises Seated Long Arc Quad with Hip Adduction - 1 x daily - 7 x weekly - 3 sets - 10 reps Seated Table Hamstring Stretch - 1 x daily - 7 x weekly - 3 sets - 2 reps - 20-30 sec hold Seated Hamstring Stretch - 1 x daily - 7 x weekly - 3 sets - 2 reps - 20-30 sec hold Seated Piriformis Stretch - 1 x daily - 7 x weekly - 3 sets - 2 reps - 20-30 sec hold Supine Lower Trunk Rotation - 1 x daily - 7 x weekly - 3 sets - 10 reps - 5 sec hold

## 2020-05-21 ENCOUNTER — Ambulatory Visit: Payer: Medicare Other | Admitting: Physical Therapy

## 2020-05-21 ENCOUNTER — Encounter: Payer: Self-pay | Admitting: Physical Therapy

## 2020-05-21 DIAGNOSIS — M79605 Pain in left leg: Secondary | ICD-10-CM | POA: Diagnosis not present

## 2020-05-21 DIAGNOSIS — M25562 Pain in left knee: Secondary | ICD-10-CM

## 2020-05-21 DIAGNOSIS — R252 Cramp and spasm: Secondary | ICD-10-CM

## 2020-05-21 DIAGNOSIS — R600 Localized edema: Secondary | ICD-10-CM

## 2020-05-21 DIAGNOSIS — G8929 Other chronic pain: Secondary | ICD-10-CM

## 2020-05-21 NOTE — Therapy (Signed)
[facility] [place] [facility] [address] [place], [place], [address] Phone: [phone]   Fax:  [phone]  Physical Therapy Treatment  Patient Details  Name: Kerry Coleman MRN: [id number] Date of Birth: Jan 21, 1935 Referring Provider (PT): Kaylyn Lim Date: 05/21/2020   PT End of Session - 05/21/20 1037    Visit Number 2    Date for PT Re-Evaluation 07/13/20    PT Start Time 1015    PT Stop Time 1100    PT Time Calculation (min) 45 min    Activity Tolerance Patient tolerated treatment well    Behavior During Therapy [facility] for tasks assessed/performed           History reviewed. No pertinent past medical history.  Past Surgical History:  Procedure Laterality Date  . BACK SURGERY    . BREAST BIOPSY Left    @ age 85, Benign   . BREAST EXCISIONAL BIOPSY    . BUNIONECTOMY    . KNEE SURGERY    . right carpal tunnel release    . right index finger excision of mass    . ROTATOR CUFF REPAIR    . SPINE SURGERY    . TONSILLECTOMY      There were no vitals filed for this visit.   Subjective Assessment - 05/21/20 1019    Subjective Patient reports doing well. She feels confident with her HEP that was given to her at the eval but was a little inconsistent with doing her exercise over thanksgiving. Reports taking 2 tablets of IB prophen daily(cant remember mg dose)    Currently in Pain? Yes    Pain Score 4                              [facility] Adult PT Treatment/Exercise - 05/21/20 0001      Knee/Hip Exercises: Aerobic   Nustep L3      Knee/Hip Exercises: Standing   Heel Raises 2 sets;10 reps    Other Standing Knee Exercises Mini squats 2x10      Knee/Hip Exercises: Supine   Bridges with Ball Squeeze 10 reps   10x3" add squeeze, x10 add squeeze with bridge   Straight Leg Raises Both;1 set;10 reps    Other Supine Knee/Hip Exercises LTR's x10, Clamshells 10x3"      Knee/Hip Exercises: Sidelying   Clams  2x10 ea side    Cuing needed to keep hips rolled forward     Manual Therapy   Manual Therapy Myofascial release    Manual therapy comments IT band stripping, very tender along distal lateral portion of thigh                    PT Short Term Goals - 05/13/20 0955      PT SHORT TERM GOAL #1   Title Pt will be I with initial HEP    Time 2    Period Weeks    Status New    Target Date 05/27/20             PT Long Term Goals - 05/13/20 0955      PT LONG TERM GOAL #1   Title Pt will be I with advanced HEP    Time 6    Period Weeks    Status New    Target Date 06/24/20      PT LONG TERM GOAL #2   Title Pt  will report 50% reduction in LLE pain    Time 6    Period Weeks    Status New    Target Date 06/24/20      PT LONG TERM GOAL #3   Title Pt will demo L SLR equivalent to RLE with </=2/10 LLE pain    Baseline L: 50 deg, R: 85 deg    Time 6    Period Weeks    Status New    Target Date 06/24/20      PT LONG TERM GOAL #4   Title Pt will report </= 1/10 LLE pain when getting in/out of car or bed    Time 6    Period Weeks    Status New    Target Date 06/24/20                 Plan - 05/21/20 1038    Clinical Impression Statement Patient presented to the clinic today doing well and feeling like she's already experiencing benefits from PT. She did well with intro to all ther ex. However, she did fatigue quickly due to muscular weakness, cramping, and some mild pain in her L hip and knee. Performed IT Band stripping to her L ITband and noted thick fibrous tissue spannig from mid/distal thigh to lateral aspect of knee. Patient felt great relief after and less knee pain.    PT Treatment/Interventions ADLs/Self Care Home Management;Cryotherapy;Electrical Stimulation;Moist Heat;Iontophoresis 4mg /ml Dexamethasone;Gait training;Stair training;Therapeutic activities;Therapeutic exercise;Balance training;Neuromuscular re-education;Manual techniques;Patient/family  education;Passive range of motion;Dry needling;Vasopneumatic Device    PT Next Visit Plan Cont. with ITband rolling, LE strength and flexibility.           Patient will benefit from skilled therapeutic intervention in order to improve the following deficits and impairments:  Abnormal gait, Decreased range of motion, Increased muscle spasms, Pain, Impaired flexibility, Decreased strength, Increased edema  Visit Diagnosis: Pain in left leg  Cramp and spasm  Localized edema  Chronic pain of left knee     Problem List Patient Active Problem List   Diagnosis Date Noted  . Stress incontinence of urine 10/26/2019  . Vitamin D deficiency 10/26/2019  . Osteopenia of lumbar spine 05/08/2019  . History of melanoma 04/27/2019  . Encounter for health maintenance examination with abnormal findings 04/27/2019  . Estrogen deficiency 04/27/2019  . Urinary tract infection with hematuria 03/31/2019  . Urinary tract infection without hematuria 03/31/2019  . Abnormal urinalysis 03/23/2019  . Elevated cholesterol 03/20/2019  . Essential hypertension 03/20/2019    03/22/2019, SPTA 05/21/2020, 11:09 AM  [facility] [address] [place], [place], [place] Phone: [phone]   Fax:  [phone]  Name: Relda Agosto MRN: [name] Date of Birth: [birth year]

## 2020-05-22 ENCOUNTER — Other Ambulatory Visit: Payer: Self-pay

## 2020-05-22 ENCOUNTER — Encounter: Payer: Self-pay | Admitting: Physical Therapy

## 2020-05-22 ENCOUNTER — Ambulatory Visit: Payer: Medicare Other | Attending: Orthopedic Surgery | Admitting: Physical Therapy

## 2020-05-22 DIAGNOSIS — M79605 Pain in left leg: Secondary | ICD-10-CM | POA: Diagnosis present

## 2020-05-22 DIAGNOSIS — R600 Localized edema: Secondary | ICD-10-CM | POA: Insufficient documentation

## 2020-05-22 DIAGNOSIS — R252 Cramp and spasm: Secondary | ICD-10-CM | POA: Insufficient documentation

## 2020-05-22 DIAGNOSIS — M25562 Pain in left knee: Secondary | ICD-10-CM | POA: Diagnosis present

## 2020-05-22 DIAGNOSIS — G8929 Other chronic pain: Secondary | ICD-10-CM | POA: Diagnosis present

## 2020-05-22 NOTE — Therapy (Signed)
[facility] [address] [place], [place], [address] Phone: [phone]   Fax:  [phone]  Physical Therapy Treatment  Patient Details  Name: Natash Berman MRN: [id number] Date of Birth: [birth year] Referring Provider (PT): Kaylyn Lim Date: 05/22/2020   PT End of Session - 05/22/20 1009    Visit Number 3    Date for PT Re-Evaluation 07/13/20    PT Start Time 0930    PT Stop Time 1011    PT Time Calculation (min) 41 min    Activity Tolerance Patient tolerated treatment well    Behavior During Therapy [facility] for tasks assessed/performed           History reviewed. No pertinent past medical history.  Past Surgical History:  Procedure Laterality Date  . BACK SURGERY    . BREAST BIOPSY Left    @ age 82, Benign   . BREAST EXCISIONAL BIOPSY    . BUNIONECTOMY    . KNEE SURGERY    . right carpal tunnel release    . right index finger excision of mass    . ROTATOR CUFF REPAIR    . SPINE SURGERY    . TONSILLECTOMY      There were no vitals filed for this visit.   Subjective Assessment - 05/22/20 0930    Subjective Doing ok overall, has some soreness from yesterday PT and activities at home    Currently in Pain? Yes    Pain Score 4     Pain Location Knee    Pain Orientation Left                             [facility] Adult PT Treatment/Exercise - 05/22/20 0001      Knee/Hip Exercises: Aerobic   Nustep L3      Knee/Hip Exercises: Standing   Lateral Step Up Both;1 set;10 reps;Hand Hold: 0;Step Height: 4"    Forward Step Up 1 set;Both;10 reps;Hand Hold: 0;Step Height: 6"      Knee/Hip Exercises: Seated   Long Arc Quad Both;2 sets;10 reps;Weights    Long Arc Quad Weight 2 lbs.    Ball Squeeze 2x10    Marching Both;2 sets;5 reps    [employer] 2 lbs.    Hamstring Curl 10 reps;Both    Hamstring Limitations red Tband     Sit to Sand 1 set;10 reps;without UE support      Manual  Therapy   Manual Therapy Myofascial release;Soft tissue mobilization    Soft tissue mobilization LAtera L thigh                    PT Short Term Goals - 05/13/20 0955      PT SHORT TERM GOAL #1   Title Pt will be I with initial HEP    Time 2    Period Weeks    Status New    Target Date 05/27/20             PT Long Term Goals - 05/22/20 1009      PT LONG TERM GOAL #1   Title Pt will be I with advanced HEP    Status On-going      PT LONG TERM GOAL #2   Title Pt will report 50% reduction in LLE pain    Status On-going  Plan - 05/22/20 1010    Clinical Impression Statement Pt did well overall, she did report some sourness in LLE from yesterday. Reports her biggest issues is lifting her L leg. She did report some L hip pain with seated marches. She had some knots and tissues adhesions down L ITB noted with MT.    Examination-Activity Limitations Bend;Squat;Stairs;Stand    Stability/Clinical Decision Making Stable/Uncomplicated    Rehab Potential Good    PT Duration 6 weeks    PT Treatment/Interventions ADLs/Self Care Home Management;Cryotherapy;Electrical Stimulation;Moist Heat;Iontophoresis 4mg /ml Dexamethasone;Gait training;Stair training;Therapeutic activities;Therapeutic exercise;Balance training;Neuromuscular re-education;Manual techniques;Patient/family education;Passive range of motion;Dry needling;Vasopneumatic Device    PT Next Visit Plan Cont. with ITband rolling, LE strength and flexibility.           Patient will benefit from skilled therapeutic intervention in order to improve the following deficits and impairments:  Abnormal gait, Decreased range of motion, Increased muscle spasms, Pain, Impaired flexibility, Decreased strength, Increased edema  Visit Diagnosis: Cramp and spasm  Localized edema  Chronic pain of left knee  Pain in left leg     Problem List Patient Active Problem List   Diagnosis Date Noted  . Stress  incontinence of urine 10/26/2019  . Vitamin D deficiency 10/26/2019  . Osteopenia of lumbar spine 05/08/2019  . History of melanoma 04/27/2019  . Encounter for health maintenance examination with abnormal findings 04/27/2019  . Estrogen deficiency 04/27/2019  . Urinary tract infection with hematuria 03/31/2019  . Urinary tract infection without hematuria 03/31/2019  . Abnormal urinalysis 03/23/2019  . Elevated cholesterol 03/20/2019  . Essential hypertension 03/20/2019    03/22/2019, PTA 05/22/2020, 10:14 AM  [facility] [place] [facility] [address] [place], [place], [place] Phone: [phone]   Fax:  [phone]  Name: Dariana Garbett MRN: [name] Date of Birth: [birth year]

## 2020-05-27 ENCOUNTER — Other Ambulatory Visit: Payer: Self-pay

## 2020-05-27 ENCOUNTER — Encounter: Payer: Self-pay | Admitting: Physical Therapy

## 2020-05-27 ENCOUNTER — Ambulatory Visit: Payer: Medicare Other | Admitting: Physical Therapy

## 2020-05-27 DIAGNOSIS — R252 Cramp and spasm: Secondary | ICD-10-CM | POA: Diagnosis not present

## 2020-05-27 DIAGNOSIS — M25562 Pain in left knee: Secondary | ICD-10-CM

## 2020-05-27 DIAGNOSIS — G8929 Other chronic pain: Secondary | ICD-10-CM

## 2020-05-27 DIAGNOSIS — M79605 Pain in left leg: Secondary | ICD-10-CM

## 2020-05-27 DIAGNOSIS — R600 Localized edema: Secondary | ICD-10-CM

## 2020-05-27 NOTE — Therapy (Signed)
[facility] [address] [place], [place], [address] Phone: [phone]   Fax:  [phone]  Physical Therapy Treatment  Patient Details  Name: Lorel Coleman MRN: [id number] Date of Birth: 21-Apr-1935 Referring Provider (PT): Kaylyn Lim Date: 05/27/2020   PT End of Session - 05/27/20 0852    Visit Number 4    Date for PT Re-Evaluation 07/13/20    PT Start Time 0845    PT Stop Time 0928    PT Time Calculation (min) 43 min    Activity Tolerance Patient tolerated treatment well    Behavior During Therapy [facility] for tasks assessed/performed           History reviewed. No pertinent past medical history.  Past Surgical History:  Procedure Laterality Date  . BACK SURGERY    . BREAST BIOPSY Left    @ age 36, Benign   . BREAST EXCISIONAL BIOPSY    . BUNIONECTOMY    . KNEE SURGERY    . right carpal tunnel release    . right index finger excision of mass    . ROTATOR CUFF REPAIR    . SPINE SURGERY    . TONSILLECTOMY      There were no vitals filed for this visit.                      [facility] Adult PT Treatment/Exercise - 05/27/20 0001      Knee/Hip Exercises: Stretches   Passive Hamstring Stretch 60 seconds    Passive Hamstring Stretch Limitations 2x30"    Quad Stretch Left;2 reps;30 seconds      Knee/Hip Exercises: Aerobic   Nustep L3      Knee/Hip Exercises: Standing   Knee Flexion Both;2 sets;10 reps    Knee Flexion Limitations #2.5 ankle wt    Hip Abduction Both;2 sets;10 reps    Abduction Limitations #2.5 ankle wt    Hip Extension Both;2 sets;10 reps    Extension Limitations #2.5 ankle wt.     Other Standing Knee Exercises Alt Marches 2x10 #2.5 ankle wt.     Other Standing Knee Exercises Sit-to-stands x15 (yellow ball)      Knee/Hip Exercises: Supine   Bridges 2 sets;10 reps    Bridges Limitations Legs straight on physioball      Knee/Hip Exercises: Sidelying   Clams 2x10 ea  side       Manual Therapy   Manual Therapy Myofascial release;Soft tissue mobilization    Manual therapy comments IT band     Soft tissue mobilization L lateral thigh                     PT Short Term Goals - 05/13/20 0955      PT SHORT TERM GOAL #1   Title Pt will be I with initial HEP    Time 2    Period Weeks    Status New    Target Date 05/27/20             PT Long Term Goals - 05/22/20 1009      PT LONG TERM GOAL #1   Title Pt will be I with advanced HEP    Status On-going      PT LONG TERM GOAL #2   Title Pt will report 50% reduction in LLE pain    Status On-going  Plan - 05/27/20 0852    Clinical Impression Statement She did well overall today. Her only complaint was with seated hip flexion exercises, but she does not have the same pain when standing doing hip flexion exercises. Encouraged pt to follow up with PCP to discuss hip pain complaints. She said her PCP is not aware of her current hip pain status or that she is doing PT.    PT Treatment/Interventions ADLs/Self Care Home Management;Cryotherapy;Electrical Stimulation;Moist Heat;Iontophoresis 4mg /ml Dexamethasone;Gait training;Stair training;Therapeutic activities;Therapeutic exercise;Balance training;Neuromuscular re-education;Manual techniques;Patient/family education;Passive range of motion;Dry needling;Vasopneumatic Device    PT Next Visit Plan Responding well to ITband rolling, hip strenghtening and stretching.           Patient will benefit from skilled therapeutic intervention in order to improve the following deficits and impairments:  Abnormal gait, Decreased range of motion, Increased muscle spasms, Pain, Impaired flexibility, Decreased strength, Increased edema  Visit Diagnosis: Chronic pain of left knee  Pain in left leg  Cramp and spasm  Localized edema     Problem List Patient Active Problem List   Diagnosis Date Noted  . Stress incontinence of  urine 10/26/2019  . Vitamin D deficiency 10/26/2019  . Osteopenia of lumbar spine 05/08/2019  . History of melanoma 04/27/2019  . Encounter for health maintenance examination with abnormal findings 04/27/2019  . Estrogen deficiency 04/27/2019  . Urinary tract infection with hematuria 03/31/2019  . Urinary tract infection without hematuria 03/31/2019  . Abnormal urinalysis 03/23/2019  . Elevated cholesterol 03/20/2019  . Essential hypertension 03/20/2019    03/22/2019, SPTA 05/27/2020, 9:33 AM  [facility] [place] [facility] [address] [place], [place], [place] Phone: [phone]   Fax:  [phone]  Name: Kerry Coleman MRN: [name] Date of Birth: 04/19/1935

## 2020-05-29 ENCOUNTER — Ambulatory Visit: Payer: Medicare Other | Admitting: Physical Therapy

## 2020-05-29 ENCOUNTER — Other Ambulatory Visit: Payer: Self-pay

## 2020-05-29 ENCOUNTER — Encounter: Payer: Self-pay | Admitting: Physical Therapy

## 2020-05-29 DIAGNOSIS — R252 Cramp and spasm: Secondary | ICD-10-CM

## 2020-05-29 DIAGNOSIS — M25562 Pain in left knee: Secondary | ICD-10-CM

## 2020-05-29 DIAGNOSIS — R600 Localized edema: Secondary | ICD-10-CM

## 2020-05-29 DIAGNOSIS — M79605 Pain in left leg: Secondary | ICD-10-CM

## 2020-05-29 DIAGNOSIS — G8929 Other chronic pain: Secondary | ICD-10-CM

## 2020-05-29 NOTE — Therapy (Signed)
[facility] [address] [place], [place], [address] Phone: [phone]   Fax:  [phone]  Physical Therapy Treatment  Patient Details  Name: Kerry Coleman MRN: [id number] Date of Birth: September 23, 1934 Referring Provider (PT): Kerry Coleman Date: 05/29/2020   PT End of Session - 05/29/20 0929    Visit Number 5    Date for PT Re-Evaluation 07/13/20    PT Start Time 0846    PT Stop Time 0930    PT Time Calculation (min) 44 min    Activity Tolerance Patient tolerated treatment well    Behavior During Therapy [facility] for tasks assessed/performed           History reviewed. No pertinent past medical history.  Past Surgical History:  Procedure Laterality Date  . BACK SURGERY    . BREAST BIOPSY Left    @ age 37, Benign   . BREAST EXCISIONAL BIOPSY    . BUNIONECTOMY    . KNEE SURGERY    . right carpal tunnel release    . right index finger excision of mass    . ROTATOR CUFF REPAIR    . SPINE SURGERY    . TONSILLECTOMY      There were no vitals filed for this visit.   Subjective Assessment - 05/29/20 0853    Subjective Pt reports that xrays of hip were clear; has MRI scheduled for 06/28/19 with follow up 07/03/19    Currently in Pain? Yes    Pain Score 3     Pain Location Knee   and lateral thigh   Pain Orientation Left                             [facility] Adult PT Treatment/Exercise - 05/29/20 0001      Knee/Hip Exercises: Stretches   Passive Hamstring Stretch Left;3 reps;20 seconds    Gastroc Stretch Both;1 rep;20 seconds    Soleus Stretch Both;1 rep;20 seconds      Knee/Hip Exercises: Aerobic   Recumbent Bike L 1.5 x 4 min    Nustep L3 x 6 min      Knee/Hip Exercises: Standing   Heel Raises Both;1 set;15 reps    Knee Flexion Both;2 sets;10 reps    Knee Flexion Limitations #2.5 ankle wt    Hip Abduction Both;2 sets;10 reps    Abduction Limitations #2.5 ankle wt    Hip Extension Both;2  sets;10 reps    Extension Limitations #2.5 ankle wt.     Other Standing Knee Exercises Alt Marches 2x10 #2.5 ankle wt.     Other Standing Knee Exercises STS with chest press and overhead raise x10      Manual Therapy   Manual Therapy Myofascial release;Soft tissue mobilization    Manual therapy comments IT band     Soft tissue mobilization L lateral thigh                     PT Short Term Goals - 05/13/20 0955      PT SHORT TERM GOAL #1   Title Pt will be I with initial HEP    Time 2    Period Weeks    Status New    Target Date 05/27/20             PT Long Term Goals - 05/29/20 0855      PT LONG TERM GOAL #1   Title  Pt will be I with advanced HEP    Status On-going      PT LONG TERM GOAL #2   Title Pt will report 50% reduction in LLE pain    Status On-going      PT LONG TERM GOAL #3   Title Pt will demo L SLR equivalent to RLE with </=2/10 LLE pain      PT LONG TERM GOAL #4   Title Pt will report </= 1/10 LLE pain when getting in/out of car or bed    Status On-going                 Plan - 05/29/20 0930    Clinical Impression Statement Pt making minimal progress toward goals; still having difficulty with transitional movements/bed mobility and with L hip pain. Reports that xrays were clear for L hip; has MRI scheduled for 06/27/2020. Pt demos significant pain/tightness in L ITB. Neg FABER and scour this rx. Continue to progress LE strengthening and manual as indicated.    PT Treatment/Interventions ADLs/Self Care Home Management;Cryotherapy;Electrical Stimulation;Moist Heat;Iontophoresis 4mg /ml Dexamethasone;Gait training;Stair training;Therapeutic activities;Therapeutic exercise;Balance training;Neuromuscular re-education;Manual techniques;Patient/family education;Passive range of motion;Dry needling;Vasopneumatic Device    PT Next Visit Plan Responding well to ITband rolling, hip strenghtening and stretching.    Consulted and Agree with Plan of Care  Patient           Patient will benefit from skilled therapeutic intervention in order to improve the following deficits and impairments:  Abnormal gait, Decreased range of motion, Increased muscle spasms, Pain, Impaired flexibility, Decreased strength, Increased edema  Visit Diagnosis: Chronic pain of left knee  Pain in left leg  Cramp and spasm  Localized edema     Problem List Patient Active Problem List   Diagnosis Date Noted  . Stress incontinence of urine 10/26/2019  . Vitamin D deficiency 10/26/2019  . Osteopenia of lumbar spine 05/08/2019  . History of melanoma 04/27/2019  . Encounter for health maintenance examination with abnormal findings 04/27/2019  . Estrogen deficiency 04/27/2019  . Urinary tract infection with hematuria 03/31/2019  . Urinary tract infection without hematuria 03/31/2019  . Abnormal urinalysis 03/23/2019  . Elevated cholesterol 03/20/2019  . Essential hypertension 03/20/2019   03/22/2019, PT, DPT Lysle Rubens Sugg 05/29/2020, 9:32 AM  [facility] [address] [place], [place], [place] Phone: [phone]   Fax:  [phone]  Name: Kerry Coleman MRN: [name] Date of Birth: 03-16-1935

## 2020-06-03 ENCOUNTER — Ambulatory Visit: Payer: Medicare Other | Admitting: Physical Therapy

## 2020-06-03 ENCOUNTER — Other Ambulatory Visit: Payer: Self-pay

## 2020-06-03 ENCOUNTER — Encounter: Payer: Self-pay | Admitting: Physical Therapy

## 2020-06-03 DIAGNOSIS — M79605 Pain in left leg: Secondary | ICD-10-CM

## 2020-06-03 DIAGNOSIS — G8929 Other chronic pain: Secondary | ICD-10-CM

## 2020-06-03 DIAGNOSIS — R252 Cramp and spasm: Secondary | ICD-10-CM | POA: Diagnosis not present

## 2020-06-03 NOTE — Therapy (Signed)
[facility] [address] [place], [place], [address] Phone: [phone]   Fax:  [phone]  Physical Therapy Treatment  Patient Details  Name: Kerry Coleman MRN: [id number] Date of Birth: 09-Nov-1934 Referring Provider (PT): Kaylyn Lim Date: 06/03/2020   PT End of Session - 06/03/20 0925    Visit Number 6    Date for PT Re-Evaluation 07/13/20    PT Start Time 0845    PT Stop Time 0928    PT Time Calculation (min) 43 min    Activity Tolerance Patient tolerated treatment well    Behavior During Therapy [facility] for tasks assessed/performed           History reviewed. No pertinent past medical history.  Past Surgical History:  Procedure Laterality Date  . BACK SURGERY    . BREAST BIOPSY Left    @ age 52, Benign   . BREAST EXCISIONAL BIOPSY    . BUNIONECTOMY    . KNEE SURGERY    . right carpal tunnel release    . right index finger excision of mass    . ROTATOR CUFF REPAIR    . SPINE SURGERY    . TONSILLECTOMY      There were no vitals filed for this visit.   Subjective Assessment - 06/03/20 0841    Subjective Good, feel like she is seeing some improvement    Currently in Pain? Yes    Pain Score 2     Pain Location Leg    Pain Orientation Left                             [facility] Adult PT Treatment/Exercise - 06/03/20 0001      Knee/Hip Exercises: Aerobic   Recumbent Bike L 1.5 x 2 min    Nustep L3 x 6 min      Knee/Hip Exercises: Machines for Strengthening   Cybex Knee Extension 5lb 2x10      Knee/Hip Exercises: Standing   Heel Raises Both;2 sets;10 reps;2 seconds    Knee Flexion Both;2 sets;10 reps    Knee Flexion Limitations #2.5 ankle wt    Hip Abduction Both;2 sets;10 reps    Abduction Limitations #2.5 ankle wt    Hip Extension Both;2 sets;10 reps    Extension Limitations #2.5 ankle wt.     Forward Step Up 1 set;Both;10 reps;Hand Hold: 0;Step Height: 6"    Walking with  Sports Cord 30lb For/bak x3 20lb side step x3      Knee/Hip Exercises: Seated   Abduction/Adduction  Both;2 sets;15 reps    Abd/Adduction Limitations green Tband    Sit to Sand 2 sets;10 reps;without UE support   6lb dumbbell     Manual Therapy   Manual Therapy Myofascial release;Soft tissue mobilization    Manual therapy comments IT band     Soft tissue mobilization L lateral thigh                     PT Short Term Goals - 05/13/20 0955      PT SHORT TERM GOAL #1   Title Pt will be I with initial HEP    Time 2    Period Weeks    Status New    Target Date 05/27/20             PT Long Term Goals - 05/29/20 2706      PT  LONG TERM GOAL #1   Title Pt will be I with advanced HEP    Status On-going      PT LONG TERM GOAL #2   Title Pt will report 50% reduction in LLE pain    Status On-going      PT LONG TERM GOAL #3   Title Pt will demo L SLR equivalent to RLE with </=2/10 LLE pain      PT LONG TERM GOAL #4   Title Pt will report </= 1/10 LLE pain when getting in/out of car or bed    Status On-going                 Plan - 06/03/20 0925    Clinical Impression Statement Pt reports some improvement overall. She continues to have difficulty lifting L leg form seated position. Some tissues density noted in ITB with MT. SLS weakness and fatigue reported with standing R hip abduction.    Examination-Activity Limitations Bend;Squat;Stairs;Stand    Examination-Participation Restrictions Community Activity;Interpersonal Relationship    Stability/Clinical Decision Making Stable/Uncomplicated    Rehab Potential Good    PT Frequency 2x / week    PT Duration 6 weeks    PT Treatment/Interventions ADLs/Self Care Home Management;Cryotherapy;Electrical Stimulation;Moist Heat;Iontophoresis 4mg /ml Dexamethasone;Gait training;Stair training;Therapeutic activities;Therapeutic exercise;Balance training;Neuromuscular re-education;Manual techniques;Patient/family  education;Passive range of motion;Dry needling;Vasopneumatic Device    PT Next Visit Plan Responding well to ITband rolling, hip strenghtening and stretching.           Patient will benefit from skilled therapeutic intervention in order to improve the following deficits and impairments:  Abnormal gait,Decreased range of motion,Increased muscle spasms,Pain,Impaired flexibility,Decreased strength,Increased edema  Visit Diagnosis: Cramp and spasm  Pain in left leg  Chronic pain of left knee     Problem List Patient Active Problem List   Diagnosis Date Noted  . Stress incontinence of urine 10/26/2019  . Vitamin D deficiency 10/26/2019  . Osteopenia of lumbar spine 05/08/2019  . History of melanoma 04/27/2019  . Encounter for health maintenance examination with abnormal findings 04/27/2019  . Estrogen deficiency 04/27/2019  . Urinary tract infection with hematuria 03/31/2019  . Urinary tract infection without hematuria 03/31/2019  . Abnormal urinalysis 03/23/2019  . Elevated cholesterol 03/20/2019  . Essential hypertension 03/20/2019    03/22/2019 06/03/2020, 9:29 AM  [facility] [address] [place], [place], [place] Phone: [phone]   Fax:  [phone]  Name: Fynn Adel MRN: [name] Date of Birth: [birth year]

## 2020-06-05 ENCOUNTER — Ambulatory Visit: Payer: Medicare Other | Admitting: Physical Therapy

## 2020-06-05 ENCOUNTER — Other Ambulatory Visit: Payer: Self-pay

## 2020-06-05 ENCOUNTER — Encounter: Payer: Self-pay | Admitting: Physical Therapy

## 2020-06-05 DIAGNOSIS — G8929 Other chronic pain: Secondary | ICD-10-CM

## 2020-06-05 DIAGNOSIS — R252 Cramp and spasm: Secondary | ICD-10-CM

## 2020-06-05 DIAGNOSIS — M79605 Pain in left leg: Secondary | ICD-10-CM

## 2020-06-05 NOTE — Therapy (Signed)
[place]. [place], [place], [address] Phone: [phone]   Fax:  [phone]  Physical Therapy Treatment  Patient Details  Name: Melina Mosteller MRN: [id number] Date of Birth: 12/24/1934 Referring Provider (PT): Baron Hamper Date: 06/05/2020   PT End of Session - 06/05/20 0923    Visit Number 7    Date for PT Re-Evaluation 07/13/20    PT Start Time 0845    PT Stop Time 0928    PT Time Calculation (min) 43 min    Activity Tolerance Patient tolerated treatment well;Patient limited by pain    Behavior During Therapy [facility] for tasks assessed/performed           History reviewed. No pertinent past medical history.  Past Surgical History:  Procedure Laterality Date  . BACK SURGERY    . BREAST BIOPSY Left    @ age 14, Benign   . BREAST EXCISIONAL BIOPSY    . BUNIONECTOMY    . KNEE SURGERY    . right carpal tunnel release    . right index finger excision of mass    . ROTATOR CUFF REPAIR    . SPINE SURGERY    . TONSILLECTOMY      There were no vitals filed for this visit.   Subjective Assessment - 06/05/20 0850    Subjective A little gimpy this morning, pt reports some L knee stiffness, Was on her feet a lot yesterday    Currently in Pain? No/denies                             [facility] Adult PT Treatment/Exercise - 06/05/20 0001      Knee/Hip Exercises: Aerobic   Nustep L3 x 7 min      Knee/Hip Exercises: Machines for Strengthening   Cybex Knee Extension 5lb 2x10    Cybex Knee Flexion 20lb 2x10      Knee/Hip Exercises: Standing   Other Standing Knee Exercises alt box taps 4, 6, & 8 inch x10 on each, Shouldr Ext for posture 5lb 2x10      Manual Therapy   Manual Therapy Myofascial release;Soft tissue mobilization;Passive ROM    Manual therapy comments IT band     Soft tissue mobilization L lateral thigh, anterior R thigh    Passive ROM L hip all directions with end range  holds                    PT Short Term Goals - 05/13/20 0955      PT SHORT TERM GOAL #1   Title Pt will be I with initial HEP    Time 2    Period Weeks    Status New    Target Date 05/27/20             PT Long Term Goals - 06/05/20 0851      PT LONG TERM GOAL #1   Title Pt will be I with advanced HEP    Status Achieved      PT LONG TERM GOAL #2   Title Pt will report 50% reduction in LLE pain    Status Partially Met      PT LONG TERM GOAL #4   Title Pt will report </= 1/10 LLE pain when getting in/out of car or bed    Status Partially Met   a little better getting out of bed  Plan - 06/05/20 0924    Clinical Impression Statement Pt enters clinic with L knee tightness that's loosened up after aerobic warm up. She was able to perform alt box taps but with L hip pain on the eight inch box. No issues with leg curls and extensions. Supine SLR with LLE did cause L hip pain, this pain was less with passive hip flexion. Some tissues density noted in the anterior proximal L quad noted, but with no soreness or tenderness. Pt has an upcoming MRI appointment.    Examination-Activity Limitations Bend;Squat;Stairs;Stand    Examination-Participation Restrictions Community Activity;Interpersonal Relationship    Stability/Clinical Decision Making Stable/Uncomplicated    Rehab Potential Good    PT Frequency 2x / week    PT Next Visit Plan Get report from MD           Patient will benefit from skilled therapeutic intervention in order to improve the following deficits and impairments:  Abnormal gait,Decreased range of motion,Increased muscle spasms,Pain,Impaired flexibility,Decreased strength,Increased edema  Visit Diagnosis: Cramp and spasm  Pain in left leg  Chronic pain of left knee     Problem List Patient Active Problem List   Diagnosis Date Noted  . Stress incontinence of urine 10/26/2019  . Vitamin D deficiency 10/26/2019  .  Osteopenia of lumbar spine 05/08/2019  . History of melanoma 04/27/2019  . Encounter for health maintenance examination with abnormal findings 04/27/2019  . Estrogen deficiency 04/27/2019  . Urinary tract infection with hematuria 03/31/2019  . Urinary tract infection without hematuria 03/31/2019  . Abnormal urinalysis 03/23/2019  . Elevated cholesterol 03/20/2019  . Essential hypertension 03/20/2019    Scot Jun, PTA 06/05/2020, 9:27 AM  [address]. [place], [place], [address] Phone: [phone]   Fax:  [phone]  Name: Ruie Sendejo MRN: [id number] Date of Birth: September 19, 1934

## 2020-06-09 ENCOUNTER — Other Ambulatory Visit: Payer: Self-pay | Admitting: Family Medicine

## 2020-06-09 DIAGNOSIS — I10 Essential (primary) hypertension: Secondary | ICD-10-CM

## 2020-06-19 ENCOUNTER — Other Ambulatory Visit: Payer: Self-pay | Admitting: Family Medicine

## 2020-07-31 ENCOUNTER — Other Ambulatory Visit: Payer: Self-pay | Admitting: Family Medicine

## 2020-07-31 DIAGNOSIS — M8588 Other specified disorders of bone density and structure, other site: Secondary | ICD-10-CM

## 2020-08-01 ENCOUNTER — Other Ambulatory Visit: Payer: Self-pay

## 2020-08-01 DIAGNOSIS — M8588 Other specified disorders of bone density and structure, other site: Secondary | ICD-10-CM

## 2020-08-01 MED ORDER — CALCIUM 600 MG PO TABS: 600.0000 mg | ORAL_TABLET | Freq: Two times a day (BID) | ORAL | 4 refills | Status: AC

## 2020-08-01 NOTE — Telephone Encounter (Signed)
Last fill 05/08/19 Last OV 11/27/19

## 2020-08-15 ENCOUNTER — Other Ambulatory Visit (HOSPITAL_BASED_OUTPATIENT_CLINIC_OR_DEPARTMENT_OTHER): Payer: Self-pay | Admitting: Family Medicine

## 2020-08-15 DIAGNOSIS — Z1231 Encounter for screening mammogram for malignant neoplasm of breast: Secondary | ICD-10-CM

## 2020-08-20 ENCOUNTER — Ambulatory Visit (HOSPITAL_BASED_OUTPATIENT_CLINIC_OR_DEPARTMENT_OTHER)
Admission: RE | Admit: 2020-08-20 | Discharge: 2020-08-20 | Disposition: A | Discharge status: Home / Self Care | Payer: Medicare Other | Source: Ambulatory Visit | Attending: Family Medicine | Admitting: Family Medicine

## 2020-08-20 ENCOUNTER — Ambulatory Visit: Payer: Medicare Other | Admitting: Family Medicine

## 2020-08-20 ENCOUNTER — Other Ambulatory Visit: Payer: Self-pay

## 2020-08-20 ENCOUNTER — Encounter (HOSPITAL_BASED_OUTPATIENT_CLINIC_OR_DEPARTMENT_OTHER): Payer: Self-pay

## 2020-08-20 DIAGNOSIS — Z1231 Encounter for screening mammogram for malignant neoplasm of breast: Secondary | ICD-10-CM | POA: Insufficient documentation

## 2020-08-30 ENCOUNTER — Ambulatory Visit: Payer: Medicare Other | Admitting: Family Medicine

## 2020-09-17 ENCOUNTER — Telehealth: Payer: Self-pay | Admitting: Family Medicine

## 2020-09-17 NOTE — Telephone Encounter (Signed)
Left message for patient to schedule Annual Wellness Visit.  Please schedule with Nurse Health Advisor Martha Stanley, RN at [facility]  °

## 2020-09-26 ENCOUNTER — Encounter: Payer: Self-pay | Admitting: Family Medicine

## 2020-09-26 ENCOUNTER — Other Ambulatory Visit: Payer: Self-pay

## 2020-09-26 ENCOUNTER — Ambulatory Visit (INDEPENDENT_AMBULATORY_CARE_PROVIDER_SITE_OTHER): Payer: Medicare Other | Admitting: Family Medicine

## 2020-09-26 VITALS — BP 148/76 | HR 78 | Temp 97.5°F | Ht 65.0 in | Wt 185.0 lb

## 2020-09-26 DIAGNOSIS — N3941 Urge incontinence: Secondary | ICD-10-CM | POA: Diagnosis not present

## 2020-09-26 DIAGNOSIS — E7800 Pure hypercholesterolemia, unspecified: Secondary | ICD-10-CM

## 2020-09-26 DIAGNOSIS — E559 Vitamin D deficiency, unspecified: Secondary | ICD-10-CM | POA: Diagnosis not present

## 2020-09-26 DIAGNOSIS — I10 Essential (primary) hypertension: Secondary | ICD-10-CM | POA: Diagnosis not present

## 2020-09-26 DIAGNOSIS — R251 Tremor, unspecified: Secondary | ICD-10-CM | POA: Diagnosis not present

## 2020-09-26 LAB — CBC
HCT: 42.6 % (ref 36.0–46.0)
Hemoglobin: 14.3 g/dL (ref 12.0–15.0)
MCHC: 33.5 g/dL (ref 30.0–36.0)
MCV: 95 fl (ref 78.0–100.0)
Platelets: 216 10*3/uL (ref 150.0–400.0)
RBC: 4.48 Mil/uL (ref 3.87–5.11)
RDW: 13.2 % (ref 11.5–15.5)
WBC: 4.3 10*3/uL (ref 4.0–10.5)

## 2020-09-26 LAB — URINALYSIS, ROUTINE W REFLEX MICROSCOPIC
Bilirubin Urine: NEGATIVE
Hgb urine dipstick: NEGATIVE
Ketones, ur: NEGATIVE
Leukocytes,Ua: NEGATIVE
Nitrite: NEGATIVE
Specific Gravity, Urine: 1.01 (ref 1.000–1.030)
Total Protein, Urine: NEGATIVE
Urine Glucose: NEGATIVE
Urobilinogen, UA: 0.2 (ref 0.0–1.0)
pH: 7 (ref 5.0–8.0)

## 2020-09-26 LAB — COMPREHENSIVE METABOLIC PANEL
ALT: 22 U/L (ref 0–35)
AST: 24 U/L (ref 0–37)
Albumin: 4.5 g/dL (ref 3.5–5.2)
Alkaline Phosphatase: 74 U/L (ref 39–117)
BUN: 19 mg/dL (ref 6–23)
CO2: 33 mEq/L — ABNORMAL HIGH (ref 19–32)
Calcium: 10.4 mg/dL (ref 8.4–10.5)
Chloride: 102 mEq/L (ref 96–112)
Creatinine, Ser: 0.67 mg/dL (ref 0.40–1.20)
GFR: 80.48 mL/min (ref >60.00)
Glucose, Bld: 101 mg/dL — ABNORMAL HIGH (ref 70–99)
Potassium: 4.1 mEq/L (ref 3.5–5.1)
Sodium: 141 mEq/L (ref 135–145)
Total Bilirubin: 1.1 mg/dL (ref 0.2–1.2)
Total Protein: 6.8 g/dL (ref 6.0–8.3)

## 2020-09-26 LAB — LIPID PANEL
Cholesterol: 256 mg/dL — ABNORMAL HIGH (ref 0–200)
HDL: 78.4 mg/dL (ref >39.00)
LDL Cholesterol: 154 mg/dL — ABNORMAL HIGH (ref 0–99)
NonHDL: 177.44
Total CHOL/HDL Ratio: 3
Triglycerides: 115 mg/dL (ref 0.0–149.0)
VLDL: 23 mg/dL (ref 0.0–40.0)

## 2020-09-26 LAB — VITAMIN D 25 HYDROXY (VIT D DEFICIENCY, FRACTURES): VITD: 38.17 ng/mL (ref 30.00–100.00)

## 2020-09-26 LAB — TSH: TSH: 1.15 u[IU]/mL (ref 0.35–4.50)

## 2020-09-26 NOTE — Patient Instructions (Signed)
Essential Tremor A tremor is trembling or shaking that a person cannot control. Most tremors affect the hands or arms. Tremors can also affect the head, vocal cords, legs, and other parts of the body. Essential tremor is a tremor without a known cause. Usually, it occurs while a person is trying to perform an action. It tends to get worse gradually as a person ages. What are the causes? The cause of this condition is not known. What increases the risk? You are more likely to develop this condition if:  You have a family member with essential tremor.  You are age 40 or older.  You take certain medicines. What are the signs or symptoms? The main sign of a tremor is a rhythmic shaking of certain parts of your body that is uncontrolled and unintentional. You may:  Have difficulty eating with a spoon or fork.  Have difficulty writing.  Nod your head up and down or side to side.  Have a quivering voice. The shaking may:  Get worse over time.  Come and go.  Be more noticeable on one side of your body.  Get worse due to stress, fatigue, caffeine, and extreme heat or cold. How is this diagnosed? This condition may be diagnosed based on:  Your symptoms and medical history.  A physical exam. There is no single test to diagnose an essential tremor. However, your health care provider may order tests to rule out other causes of your condition. These may include:  Blood and urine tests.  Imaging studies of your brain, such as CT scan and MRI.  A test that measures involuntary muscle movement (electromyogram).   How is this treated? Treatment for essential tremor depends on the severity of the condition.  Some tremors may go away without treatment.  Mild tremors may not need treatment if they do not affect your day-to-day life.  Severe tremors may need to be treated using one or more of the following options: ? Medicines. ? Lifestyle changes. ? Occupational or physical  therapy. Follow these instructions at home: Lifestyle  Do not use any products that contain nicotine or tobacco, such as cigarettes and e-cigarettes. If you need help quitting, ask your health care provider.  Limit your caffeine intake as told by your health care provider.  Try to get 8 hours of sleep each night.  Find ways to manage your stress that fits your lifestyle and personality. Consider trying meditation or yoga.  Try to anticipate stressful situations and allow extra time to manage them.  If you are struggling emotionally with the effects of your tremor, consider working with a mental health provider.   General instructions  Take over-the-counter and prescription medicines only as told by your health care provider.  Avoid extreme heat and extreme cold.  Keep all follow-up visits as told by your health care provider. This is important. Visits may include physical therapy visits. Contact a health care provider if:  You experience any changes in the location or intensity of your tremors.  You start having a tremor after starting a new medicine.  You have tremor with other symptoms, such as: ? Numbness. ? Tingling. ? Pain. ? Weakness.  Your tremor gets worse.  Your tremor interferes with your daily life.  You feel down, blue, or sad for at least 2 weeks in a row.  Worrying about your tremor and what other people think about you interferes with your everyday life functions, including relationships, work, or school. Summary  Essential tremor   is a tremor without a known cause. Usually, it occurs when you are trying to perform an action.  You are more likely to develop this condition if you have a family member with essential tremor.  The main sign of a tremor is a rhythmic shaking of certain parts of your body that is uncontrolled and unintentional.  Treatment for essential tremor depends on the severity of the condition. This information is not intended to  replace advice given to you by your health care provider. Make sure you discuss any questions you have with your health care provider. Document Revised: 03/01/2020 Document Reviewed: 03/01/2020 Elsevier Patient Education  2021 [employer] Inc.  Tremor A tremor is trembling or shaking that you cannot control. Most tremors affect the hands or arms. Tremors can also affect the head, vocal cords, face, and other parts of the body. There are many types of tremors. Common types include:  Essential tremor. These usually occur in people older than 40. It may run in families and can happen in otherwise healthy people.  Resting tremor. These occur when the muscles are at rest, such as when your hands are resting in your lap. People with Parkinson's disease often have resting tremors.  Postural tremor. These occur when you try to hold a pose, such as keeping your hands outstretched.  Kinetic tremor. These occur during purposeful movement, such as trying to touch a finger to your nose.  Task-specific tremor. These may occur when you perform certain tasks such as writing, speaking, or standing.  Psychogenic tremor. These dramatically lessen or disappear when you are distracted. They can happen in people of all ages. Some types of tremors have no known cause. Tremors can also be a symptom of nervous system problems (neurological disorders) that may occur with aging. Some tremors go away with treatment, while others do not. Follow these instructions at home: Lifestyle  Limit alcohol intake to no more than 1 drink a day for nonpregnant women and 2 drinks a day for men. One drink equals 12 oz of beer, 5 oz of wine, or 1 oz of hard liquor.  Do not use any products that contain nicotine or tobacco, such as cigarettes and e-cigarettes. If you need help quitting, ask your health care provider.  Avoid extreme heat and extreme cold.  Limit your caffeine intake, as told by your health care provider.  Try to get  8 hours of sleep each night.  Find ways to manage your stress, such as meditation or yoga.      General instructions  Take over-the-counter and prescription medicines only as told by your health care provider.  Keep all follow-up visits as told by your health care provider. This is important. Contact a health care provider if you:  Develop a tremor after starting a new medicine.  Have a tremor along with other symptoms such as: ? Numbness. ? Tingling. ? Pain. ? Weakness.  Notice that your tremor gets worse.  Notice that your tremor interferes with your day-to-day life. Summary  A tremor is trembling or shaking that you cannot control.  Most tremors affect the hands or arms.  Some types of tremors have no known cause. Others may be a symptom of nervous system problems (neurological disorders).  Make sure you discuss any tremors you have with your health care provider. This information is not intended to replace advice given to you by your health care provider. Make sure you discuss any questions you have with your health care provider. Document   Revised: 03/01/2020 Document Reviewed: 03/01/2020 Elsevier Patient Education  [place].

## 2020-09-26 NOTE — Progress Notes (Signed)
Established Patient Office Visit  Subjective:  Patient ID: Kerry Coleman, female    DOB: 1934/10/29  Age: 86 y.o. MRN: [id number]  CC:  Chief Complaint  Patient presents with  . Follow-up    Follow up on cholesterol. Patient fasting for labs. Concerns about urine incontinence, tremors becoming worse.     HPI Kerry Coleman presents for follow-up of hypertension, elevated cholesterol vitamin D deficiency.  Continues metoprolol and chlorthalidone for heartburn hypertension.  She feels stressed with her husband's upcoming shoulder surgery at the end of the month.  Continues pravastatin vitamin D at 1000 units daily for control of cholesterol and vitamin D deficiency.  Ongoing history of urinary incontinence.  Status post 3 vaginal deliveries.  She feels the urge to urinate and sometimes does not make it to the bathroom.  She is wearing a pad.  She has tried Kegel exercises in the past good result.  She has a tremor in her left hand.  It had previously been in the right hand but now it is on the left.  She denies any difficulty with ambulation other than feeling unbalanced at times.  She has no difficulty writing.  She can control her tremor by pressing her hand into her thigh or holding it with her right hand.  Her twin sister does not have a tremor.  History reviewed. No pertinent past medical history.  Past Surgical History:  Procedure Laterality Date  . BACK SURGERY    . BREAST BIOPSY Left    @ age 89, Benign   . BUNIONECTOMY    . KNEE SURGERY    . right carpal tunnel release    . right index finger excision of mass    . ROTATOR CUFF REPAIR    . SPINE SURGERY    . TONSILLECTOMY      Family History  Problem Relation Age of Onset  . Heart disease Mother   . Stroke Mother   . Arthritis Father   . Heart disease Father   . Hyperlipidemia Sister   . Hypertension Sister   . Arthritis Sister   . Cancer Sister   . Breast cancer Sister 42  . Hypertension Son   . Hyperlipidemia Son    . Breast cancer Cousin     Social History   Socioeconomic History  . Marital status: Married    Spouse name: Not on file  . Number of children: Not on file  . Years of education: Not on file  . Highest education level: Not on file  Occupational History  . Not on file  Tobacco Use  . Smoking status: Never Smoker  . Smokeless tobacco: Never Used  Substance and Sexual Activity  . Alcohol use: Yes    Alcohol/week: 7.0 standard drinks    Types: 7 Glasses of wine per week    Comment: 1 glass of red wine daily.   . Drug use: Never  . Sexual activity: Not on file  Other Topics Concern  . Not on file  Social History Narrative  . Not on file   Social Determinants of Health   Financial Resource Strain: Low Risk   . Difficulty of Paying Living Expenses: Not hard at all  Food Insecurity: No Food Insecurity  . Worried About Programme researcher, broadcasting/film/video in the Last Year: Never true  . Ran Out of Food in the Last Year: Never true  Transportation Needs: No Transportation Needs  . Lack of Transportation (Medical): No  . Lack of Transportation (  Non-Medical): No  Physical Activity: Not on file  Stress: Not on file  Social Connections: Not on file  Intimate Partner Violence: Not on file    Outpatient Medications Prior to Visit  Medication Sig Dispense Refill  . calcium carbonate (OS-CAL) 600 MG tablet Take 1 tablet (600 mg total) by mouth 2 (two) times daily. 180 tablet 4  . chlorthalidone (HYGROTON) 25 MG tablet TAKE 1/2 TABLET BY MOUTH DAILY 100 tablet 1  . metoprolol succinate (TOPROL-XL) 50 MG 24 hr tablet Take 1 tablet (50 mg total) by mouth daily. Take with or immediately following a meal. 90 tablet 3  . pravastatin (PRAVACHOL) 80 MG tablet Take 1 tablet (80 mg total) by mouth daily. 90 tablet 3  . Vitamin D, Cholecalciferol, 25 MCG (1000 UT) CAPS TAKE 1 CAPSULE BY MOUTH EVERY DAY 180 capsule 0  . Aspirin 81 MG CAPS Take 81 mg by mouth daily.    . Multiple Vitamins-Minerals (CENTRUM  SILVER 50+WOMEN PO) Centrum Silver Women    . traZODone (DESYREL) 50 MG tablet Take 0.5-1 tablets (25-50 mg total) by mouth at bedtime as needed for sleep. (Patient not taking: Reported on 09/26/2020) 30 tablet 1   No facility-administered medications prior to visit.    No Known Allergies  ROS Review of Systems  Constitutional: Negative.   HENT: Negative.   Eyes: Negative for photophobia and visual disturbance.  Respiratory: Negative.   Cardiovascular: Negative.   Gastrointestinal: Negative.   Genitourinary: Positive for urgency. Negative for dysuria and frequency.  Musculoskeletal: Positive for arthralgias. Negative for gait problem.  Skin: Negative.   Neurological: Positive for tremors and light-headedness. Negative for speech difficulty and weakness.  Psychiatric/Behavioral: The patient is nervous/anxious.       Objective:    Physical Exam Vitals and nursing note reviewed.  Constitutional:      General: She is not in acute distress.    Appearance: Normal appearance. She is not ill-appearing, toxic-appearing or diaphoretic.  HENT:     Head: Normocephalic and atraumatic.     Right Ear: Tympanic membrane, ear canal and external ear normal.     Left Ear: Tympanic membrane, ear canal and external ear normal.     Mouth/Throat:     Mouth: Mucous membranes are dry.     Pharynx: Oropharynx is clear.  Eyes:     General:        Right eye: No discharge.        Left eye: No discharge.     Conjunctiva/sclera: Conjunctivae normal.     Pupils: Pupils are equal, round, and reactive to light.  Cardiovascular:     Rate and Rhythm: Normal rate and regular rhythm.  Pulmonary:     Effort: Pulmonary effort is normal.     Breath sounds: Normal breath sounds.  Abdominal:     General: Bowel sounds are normal.  Musculoskeletal:     Cervical back: No rigidity or tenderness.     Right lower leg: No edema.     Left lower leg: No edema.  Lymphadenopathy:     Cervical: No cervical  adenopathy.  Neurological:     Mental Status: She is alert.     Cranial Nerves: No dysarthria or facial asymmetry.     Motor: Tremor present. No pronator drift.     Coordination: Romberg sign negative. Coordination normal. Finger-Nose-Finger Test normal.     Comments: Unilateral resting tremor noted in left hand.  Micrographia not noted.  Gait normal.  Felt light headed  with Romberg.   Psychiatric:        Mood and Affect: Mood normal.        Behavior: Behavior normal.     BP (!) 148/76   Pulse 78   Temp (!) 97.5 F (36.4 C) (Temporal)   Ht 5\' 5"  (1.651 m)   Wt 185 lb (83.9 kg)   SpO2 95%   BMI 30.79 kg/m  Wt Readings from Last 3 Encounters:  09/26/20 185 lb (83.9 kg)  02/29/20 182 lb 12.8 oz (82.9 kg)  11/27/19 186 lb 12.8 oz (84.7 kg)     There are no preventive care reminders to display for this patient.  There are no preventive care reminders to display for this patient.  No results found for: TSH Lab Results  Component Value Date   WBC 5.3 02/29/2020   HGB 13.5 02/29/2020   HCT 40.2 02/29/2020   MCV 95.0 02/29/2020   PLT 212.0 02/29/2020   Lab Results  Component Value Date   NA 139 02/29/2020   K 3.9 02/29/2020   CO2 30 02/29/2020   GLUCOSE 102 (H) 02/29/2020   BUN 18 02/29/2020   CREATININE 0.69 02/29/2020   BILITOT 1.0 02/29/2020   ALKPHOS 70 02/29/2020   AST 24 02/29/2020   ALT 19 02/29/2020   PROT 6.7 02/29/2020   ALBUMIN 4.4 02/29/2020   CALCIUM 9.7 02/29/2020   GFR 81.16 02/29/2020   Lab Results  Component Value Date   CHOL 227 (H) 02/29/2020   Lab Results  Component Value Date   HDL 67.60 02/29/2020   Lab Results  Component Value Date   LDLCALC 135 (H) 02/29/2020   Lab Results  Component Value Date   TRIG 123.0 02/29/2020   Lab Results  Component Value Date   CHOLHDL 3 02/29/2020   No results found for: HGBA1C    Assessment & Plan:   Problem List Items Addressed This Visit      Cardiovascular and Mediastinum    Essential hypertension - Primary   Relevant Orders   CBC   Comprehensive metabolic panel     Other   Elevated cholesterol   Relevant Orders   Lipid panel   Vitamin D deficiency   Relevant Orders   VITAMIN D 25 Hydroxy (Vit-D Deficiency, Fractures)   Urge incontinence   Relevant Orders   Urinalysis, Routine w reflex microscopic   Ambulatory referral to Urology   Tremor of left hand   Relevant Orders   TSH   Ambulatory referral to Neurology      No orders of the defined types were placed in this encounter.   Follow-up: Return in about 6 months (around 03/28/2021), or if symptoms worsen or fail to improve.  Patient advised to avoid fluids a couple hours before bedtime.  Patient will continue to exercise to lessen her stress levels.  She already drinks decaffeinated coffee.    05/28/2021, MD

## 2020-10-02 ENCOUNTER — Telehealth: Payer: Self-pay

## 2020-10-02 NOTE — Telephone Encounter (Signed)
Patient calling states that she she have not heard from anyone regarding referral to urologist. Do you know the status of this referral?

## 2020-11-01 ENCOUNTER — Other Ambulatory Visit: Payer: Self-pay

## 2020-11-01 ENCOUNTER — Ambulatory Visit (INDEPENDENT_AMBULATORY_CARE_PROVIDER_SITE_OTHER): Payer: Medicare Other | Admitting: Family Medicine

## 2020-11-01 ENCOUNTER — Encounter: Payer: Self-pay | Admitting: Family Medicine

## 2020-11-01 VITALS — BP 136/82 | HR 85 | Temp 97.4°F | Ht 65.0 in | Wt 188.4 lb

## 2020-11-01 DIAGNOSIS — N3000 Acute cystitis without hematuria: Secondary | ICD-10-CM | POA: Diagnosis not present

## 2020-11-01 DIAGNOSIS — N3941 Urge incontinence: Secondary | ICD-10-CM | POA: Diagnosis not present

## 2020-11-01 LAB — POCT URINALYSIS DIPSTICK
Bilirubin, UA: NEGATIVE
Blood, UA: POSITIVE
Glucose, UA: NEGATIVE
Ketones, UA: NEGATIVE
Nitrite, UA: POSITIVE
Protein, UA: POSITIVE — AB
Spec Grav, UA: 1.02 (ref 1.010–1.025)
Urobilinogen, UA: 0.2 E.U./dL
pH, UA: 7 (ref 5.0–8.0)

## 2020-11-01 MED ORDER — SULFAMETHOXAZOLE-TRIMETHOPRIM 800-160 MG PO TABS
1.0000 | ORAL_TABLET | Freq: Two times a day (BID) | ORAL | 0 refills | Status: AC
Start: 2020-11-01 — End: 2020-11-04

## 2020-11-01 MED ORDER — PHENAZOPYRIDINE HCL 100 MG PO TABS: 100.0000 mg | ORAL_TABLET | Freq: Three times a day (TID) | ORAL | 0 refills | Status: DC | PRN

## 2020-11-01 NOTE — Progress Notes (Signed)
[facility] [address] [place] [address] Dept: [phone] Dept Fax: [phone]  Office Visit  Subjective:    Patient ID: Kerry Coleman, female    DOB: Sep 11, 1934, 86 y.o..   MRN: [id number]  Chief Complaint  Patient presents with  . Acute Visit    C/o having urine frequency & burning x 2 days.      History of Present Illness:  Patient is in today with a 2-day history of dysuria and frequency. She denies fever, low abdominal pain, or back pain. Ms. Gagliano has a history of recurrent UTIS. She also for some years has been dealing with progressive urge incontinence. She has an appointment on next Thursday with Dr. Alvester Morin (urology) to discuss options for management of this.  Past Medical History: Patient Active Problem List   Diagnosis Date Noted  . Urge incontinence 09/26/2020  . Tremor of left hand 09/26/2020  . Stress incontinence of urine 10/26/2019  . Vitamin D deficiency 10/26/2019  . Osteopenia of lumbar spine 05/08/2019  . History of melanoma 04/27/2019  . Encounter for health maintenance examination with abnormal findings 04/27/2019  . Estrogen deficiency 04/27/2019  . Urinary tract infection with hematuria 03/31/2019  . Urinary tract infection without hematuria 03/31/2019  . Abnormal urinalysis 03/23/2019  . Elevated cholesterol 03/20/2019  . Essential hypertension 03/20/2019   Past Surgical History:  Procedure Laterality Date  . BACK SURGERY    . BREAST BIOPSY Left    @ age 51, Benign   . BUNIONECTOMY    . KNEE SURGERY    . right carpal tunnel release    . right index finger excision of mass    . ROTATOR CUFF REPAIR    . SPINE SURGERY    . TONSILLECTOMY     Family History  Problem Relation Age of Onset  . Heart disease Mother   . Stroke Mother   . Arthritis Father   . Heart disease Father   . Hyperlipidemia Sister   . Hypertension Sister   . Arthritis Sister   . Cancer Sister   . Breast  cancer Sister 56  . Hypertension Son   . Hyperlipidemia Son   . Breast cancer Cousin    Outpatient Medications Prior to Visit  Medication Sig Dispense Refill  . calcium carbonate (OS-CAL) 600 MG tablet Take 1 tablet (600 mg total) by mouth 2 (two) times daily. 180 tablet 4  . chlorthalidone (HYGROTON) 25 MG tablet TAKE 1/2 TABLET BY MOUTH DAILY 100 tablet 1  . ibuprofen (ADVIL) 200 MG tablet Take 200 mg by mouth every 6 (six) hours as needed.    . metoprolol succinate (TOPROL-XL) 50 MG 24 hr tablet Take 1 tablet (50 mg total) by mouth daily. Take with or immediately following a meal. 90 tablet 3  . Multiple Vitamins-Minerals (CENTRUM SILVER 50+WOMEN PO) Centrum Silver Women    . naproxen sodium (ALEVE) 220 MG tablet Take 220 mg by mouth.    . pravastatin (PRAVACHOL) 80 MG tablet Take 1 tablet (80 mg total) by mouth daily. 90 tablet 3  . Vitamin D, Cholecalciferol, 25 MCG (1000 UT) CAPS TAKE 1 CAPSULE BY MOUTH EVERY DAY 180 capsule 0  . Aspirin 81 MG CAPS Take 81 mg by mouth daily.     No facility-administered medications prior to visit.   No Known Allergies    Objective:   Today's Vitals   11/01/20 1611  BP: 136/82  Pulse: 85  Temp: (!) 97.4 F (  36.3 C)  TempSrc: Temporal  SpO2: 97%  Weight: 188 lb 6.4 oz (85.5 kg)  Height: 5\' 5"  (1.651 m)   Body mass index is 31.35 kg/m.   General: Well developed, well nourished. No acute distress. Psych: Alert and oriented x3. Normal mood and affect.  There are no preventive care reminders to display for this patient.  Lab Results Urine dipstick shows negative for glucose, ketones, urobilinogen, positive for nitrites, leukocytes, red blood cells, protein.    Assessment & Plan:   1. Acute cystitis without hematuria Reviewed results of UA with patient. Recommend she push fluids and consider either cranberry juice or cranberry extract. I will prescribe a 3-day course of Septra DS bid and Pyridium for comfort.  - POCT Urinalysis  Dipstick - sulfamethoxazole-trimethoprim (BACTRIM DS) 800-160 MG tablet; Take 1 tablet by mouth 2 (two) times daily for 3 days.  Dispense: 6 tablet; Refill: 0 - phenazopyridine (PYRIDIUM) 100 MG tablet; Take 1 tablet (100 mg total) by mouth 3 (three) times daily as needed for pain.  Dispense: 10 tablet; Refill: 0  2. Urge incontinence We discussed the importance of her addressing the issue. I will defer management to Dr. .  Allyson Sabal, MD

## 2020-11-10 ENCOUNTER — Other Ambulatory Visit: Payer: Self-pay | Admitting: Family Medicine

## 2020-11-10 DIAGNOSIS — I10 Essential (primary) hypertension: Secondary | ICD-10-CM

## 2020-11-10 DIAGNOSIS — E7800 Pure hypercholesterolemia, unspecified: Secondary | ICD-10-CM

## 2020-11-14 ENCOUNTER — Telehealth: Payer: Self-pay | Admitting: Family Medicine

## 2020-11-14 NOTE — Telephone Encounter (Signed)
I spoke to patient and she will call back and schedule Medicare Annual Wellness Visit (AWV) in office.   If not able to come in office, please offer to do virtually or by telephone.   Last AWV:  Please schedule at anytime with Nurse Health Advisor.

## 2020-12-10 ENCOUNTER — Other Ambulatory Visit: Payer: Self-pay

## 2020-12-10 ENCOUNTER — Ambulatory Visit (INDEPENDENT_AMBULATORY_CARE_PROVIDER_SITE_OTHER): Payer: Medicare Other | Admitting: Neurology

## 2020-12-10 ENCOUNTER — Encounter: Payer: Self-pay | Admitting: Neurology

## 2020-12-10 VITALS — BP 189/91 | HR 78 | Ht 65.0 in | Wt 187.0 lb

## 2020-12-10 DIAGNOSIS — R251 Tremor, unspecified: Secondary | ICD-10-CM | POA: Diagnosis not present

## 2020-12-10 DIAGNOSIS — G20C Parkinsonism, unspecified: Secondary | ICD-10-CM

## 2020-12-10 DIAGNOSIS — G20 Parkinson's disease: Secondary | ICD-10-CM | POA: Diagnosis not present

## 2020-12-10 NOTE — Patient Instructions (Signed)
You have a hand tremor affecting both hands, some of your symptoms could be due to mild underlying Parkinson's disease or parkinsonism.  I do not believe we have to treat you for this for now, I am not completely sure about the etiology of your tremor.  Some tremors are hereditary but you do not have a family history of tremors.    Please remember, that any kind of tremor may be exacerbated by anxiety, anger, nervousness, excitement, dehydration, sleep deprivation, by caffeine, and low blood sugar values or blood sugar fluctuations. Some medications can exacerbate tremors, this includes certain antidepressant medications.  For diagnostic help, I would like to pursue a special brain scan called a DaT scan: This is a specialized brain scan designed to help with diagnosis of tremor disorders. A radioactive marker gets injected and the uptake is measured in the brain and compared to normal controls and right side is compared to the left, a change in uptake can help with diagnosis of certain tremor disorders. A brain MRI on the other hand is a brain scan that helps look at the brain structure in more detail overall and look for age-related changes, blood vessel related changes and look for stroke and volume loss which we call atrophy.  Please be aware that this is not a definitive test for any underlying cause for tremors.  It is a helpful diagnostic tool.  We will call you with your scan results and plan a follow-up afterwards.

## 2020-12-10 NOTE — Progress Notes (Signed)
Subjective:    Patient ID: Kerry Coleman is a 86 y.o. female.  HPI    Kerry Foley, MD, PhD [facility] [address], [address] [address] [place], [place] [address]  Dear Dr. Doreene Burke,   I saw your patient, Kerry Coleman, upon your kind request in my neurologic clinic today for initial consultation of her tremor.  The patient is accompanied by her husband today.  As you know, Kerry Coleman is an 85 year old right-handed woman with an underlying medical history of arthritis, status post bilateral knee replacements, status post left shoulder rotator cuff surgery, significant arthritis affecting her hands and right more than left feet, status post carpal tunnel surgery, status post back surgery twice, and mild obesity, who reports a approximately 3-year history of tremors affecting primarily her right hand.  It started about 3 years ago, she was still living in [place] at the time.  They moved about 2 years ago.  She had previously seen her primary care physician in [place] for the tremor and was reassured.  She has noticed progression of her tremor and has noticed in the past few months that it is affecting her left hand as well.  She is not impaired in her day-to-day functioning but is worried about this tremor.  She has a twin sister who does not have a tremor, no other family history of tremors.  No family history of Parkinson's disease.  She has not been sleeping very well.  She has nocturia.  She has some sleepiness during the day.  She has difficulty staying asleep.  She has never had a sleep study.  Her older sister passed away at the age of 44 from breast cancer.  She has 3 grown sons, 1 lives in [place], the other in [place] and 1 lives in [place].  She has noticed that stress makes it worse.  She has had some stress, she takes care of her husband.  She has not been drinking caffeine on a day-to-day basis, decaf coffee typically.  She does drink up to 2  glasses of wine per day.  She had recent blood work through your office on 09/26/2020 and I was able to review the results.  CMP was unremarkable, BUN 19, creatinine 0.67.  Lipid panel showed cholesterol of 256, LDL elevated at 154, urinalysis unremarkable.  Vitamin D 38.17, TSH normal at 1.15, CBC without differential normal.  She is a non-smoker.  She has not noticed much in the way of fine motor dyscontrol, she has not had any recent falls.  Her Past Medical History Is Significant For: History reviewed. No pertinent past medical history.  Her Past Surgical History Is Significant For: Past Surgical History:  Procedure Laterality Date   BACK SURGERY     BREAST BIOPSY Left    @ age 47, Benign    BUNIONECTOMY     KNEE SURGERY     right carpal tunnel release     right index finger excision of mass     ROTATOR CUFF REPAIR     SPINE SURGERY     TONSILLECTOMY      Her Family History Is Significant For: Family History  Problem Relation Age of Onset   Heart disease Mother    Stroke Mother    Arthritis Father    Heart disease Father    Hyperlipidemia Sister    Hypertension Sister    Arthritis Sister    Cancer Sister    Breast cancer Sister 55  Hypertension Son    Hyperlipidemia Son    Breast cancer Cousin     Her Social History Is Significant For: Social History   Socioeconomic History   Marital status: Married    Spouse name: Not on file   Number of children: Not on file   Years of education: Not on file   Highest education level: Not on file  Occupational History   Not on file  Tobacco Use   Smoking status: Never   Smokeless tobacco: Never  Substance and Sexual Activity   Alcohol use: Yes    Alcohol/week: 7.0 standard drinks    Types: 7 Glasses of wine per week    Comment: 1 glass of red wine daily.    Drug use: Never   Sexual activity: Not on file  Other Topics Concern   Not on file  Social History Narrative   Not on file   Social Determinants of Health    Financial Resource Strain: Not on file  Food Insecurity: Not on file  Transportation Needs: Not on file  Physical Activity: Not on file  Stress: Not on file  Social Connections: Not on file    Her Allergies Are:  No Known Allergies:   Her Current Medications Are:  Outpatient Encounter Medications as of 12/10/2020  Medication Sig   calcium carbonate (OS-CAL) 600 MG tablet Take 1 tablet (600 mg total) by mouth 2 (two) times daily.   chlorthalidone (HYGROTON) 25 MG tablet TAKE 1/2 TABLET BY MOUTH DAILY   ibuprofen (ADVIL) 200 MG tablet Take 200 mg by mouth every 6 (six) hours as needed.   metoprolol succinate (TOPROL-XL) 50 MG 24 hr tablet TAKE 1 TABLET(50 MG) BY MOUTH DAILY WITH OR IMMEDIATELY FOLLOWING A MEAL   Multiple Vitamins-Minerals (CENTRUM SILVER 50+WOMEN PO) Centrum Silver Women   naproxen sodium (ALEVE) 220 MG tablet Take 220 mg by mouth.   phenazopyridine (PYRIDIUM) 100 MG tablet Take 1 tablet (100 mg total) by mouth 3 (three) times daily as needed for pain.   pravastatin (PRAVACHOL) 80 MG tablet TAKE 1 TABLET(80 MG) BY MOUTH DAILY   Vibegron (GEMTESA) 75 MG TABS Take by mouth.   Vitamin D, Cholecalciferol, 25 MCG (1000 UT) CAPS TAKE 1 CAPSULE BY MOUTH EVERY DAY   No facility-administered encounter medications on file as of 12/10/2020.  :   Review of Systems:  Out of a complete 14 point review of systems, all are reviewed and negative with the exception of these symptoms as listed below:  Review of Systems  Neurological:        Here for consult on worsening bilateral hand tremors. Reports the right hand is worse and she is right handed.    Objective:  Neurological Exam  Physical Exam Physical Examination:   Vitals:   12/10/20 0957  BP: (!) 189/91  Pulse: 78    General Examination: The patient is a very pleasant 86 y.o. female in no acute distress. She appears well-developed and well-nourished and well groomed.  She denies any symptoms from her blood pressure  elevation, reports that she typically has a high blood pressure when she comes to the doctor's office.  HEENT: Normocephalic, atraumatic, pupils are equal, round and reactive to light, extraocular tracking is well-preserved, hearing is grossly intact, she has hearing aids.  She has corrective eyeglasses in place.  Face is symmetric, no significant facial masking noted, perhaps mild nuchal rigidity noted.  She has no lip, neck or jaw tremor, no significant hypophonia, no voice tremor.  No carotid bruits.  Airway examination reveals a small airway entry, tonsils are absent.  Tongue protrudes centrally and palate elevates symmetrically, tongue movements are good.    Chest: Clear to auscultation without wheezing, rhonchi or crackles noted.  Heart: S1+S2+0, regular and normal without murmurs, rubs or gallops noted.   Abdomen: Soft, non-tender and non-distended with normal bowel sounds appreciated on auscultation.  Extremities: There is trace pitting edema in the distal lower extremities bilaterally. Pedal pulses are intact.  Skin: Warm and dry without trophic changes noted.  Musculoskeletal: exam reveals decreased range of motion in both knees, status post bilateral knee replacement surgeries, left forefoot pain is reported but no obvious change or redness or swelling seen.  She has prominent arthritic changes of both hands.    Neurologically:  Mental status: The patient is awake, alert and oriented in all 4 spheres. Her immediate and remote memory, attention, language skills and fund of knowledge are appropriate. There is no evidence of aphasia, agnosia, apraxia or anomia. Speech is clear with normal prosody and enunciation. Thought process is linear. Mood is normal and affect is normal.  Cranial nerves II - XII are as described above under HEENT exam. In addition: shoulder shrug is normal with equal shoulder height noted. Motor exam: Normal bulk, strength and tone is noted with questionable increased  tone in the right and left upper extremities. There is no drift, or rebound. Romberg is not tested for safety concerns.  Reflexes are 1+ in the upper extremities and absent in the lower extremities.  2+ throughout. Babinski: Toes are flexor bilaterally.  On fine motor skills testing she does have difficulty with finger taps and hand movements, right side a little worse than left, also almost trigger finger type changes with hand movements noted.  Rapid alternating patting fairly well-preserved, foot taps are mildly impaired bilaterally.  She has a mild and intermittent resting tremor in the right more than left upper extremity, no resting tremor in the left or right lower extremities.  She has a very minimal postural tremor in both upper extremities, no significant action tremor, no intention tremor.    On 12/10/2020: On Archimedes spiral drawing she has no significant trembling with the right or left hand, mild insecurity with her nondominant/left hand.  Handwriting is slightly tremulous, not particularly micrographic and well legible. Cerebellar testing: No dysmetria or intention tremor on finger to nose testing. Heel to shin is difficult. Sensory exam: intact to light touch in the upper and lower extremities.  Gait, station and balance: She stands with difficulty, possible mild decrease in arm swing noted on the right, no obvious shuffling.  Assessment and Plan:   In summary, Kerry Coleman is a very pleasant 86 y.o.-year old female with an underlying medical history of arthritis, status post bilateral knee replacements, status post left shoulder rotator cuff surgery, significant arthritis affecting her hands and right more than left feet, status post carpal tunnel surgery, status post back surgery twice, and mild obesity, who presents for evaluation of her tremors of approximately 3 years duration with initially right hand tremor noted and more recently left hand tremor noted.  Symptoms and presentation  and family history are not telltale for essential tremor but also not classic for Parkinson's disease although she does have a resting tremor in both upper extremities, right more than left.  She has some subtle parkinsonian symptoms including mild rigidity, mild decrease in arm swing but nothing that stands out, nothing alarming.  Nevertheless, further work-up is  indicated and observation and rechecking is also feasible.  She is not keen on trying any new medication.  I would not suggest any symptomatic treatment for her tremor at this time but would like to proceed with a DaTscan which can help as a diagnostic tool to narrow down etiology of tremor disorders.  I explained this test to her and her husband at length today and she was agreeable to pursuing this, informed consent was obtained today in preparation for her scan.  We talked about tremor triggers including anxiety, depression, and dehydration as well as stress and sleep deprivation.  She has not noticed any improvement in her tremor since she has been on the Toprol.  She is advised to stay well-hydrated with water and try to avoid drinking alcohol on a daily basis especially avoid drinking 2 glasses of wine per day.  She is advised that we will call her once the DaTscan results are in and plan a follow-up in this clinic afterwards.  I answered all the questions today and the patient and her husband were in agreement with the plan.  Thank you very much for allowing me to participate in the care of this nice patient. If I can be of any further assistance to you please do not hesitate to call me at [phone].  Sincerely,   Kerry FoleySaima Tyvion Edmondson, MD, PhD

## 2020-12-12 ENCOUNTER — Telehealth: Payer: Self-pay | Admitting: Neurology

## 2020-12-12 NOTE — Telephone Encounter (Signed)
Received signed patient consent and completed all ppw for DaTscan referral. Given to MD to sign.

## 2020-12-16 NOTE — Telephone Encounter (Signed)
Faxed signed ppw to [employer]. They will review and obtain auth if needed and notify me of the decision.

## 2020-12-16 NOTE — Telephone Encounter (Signed)
Thank you :)

## 2020-12-16 NOTE — Addendum Note (Signed)
Addended by: Huston Foley on: 12/16/2020 03:58 PM   Modules accepted: Orders

## 2020-12-16 NOTE — Telephone Encounter (Signed)
I ordered the DaTscan as an addendum to the recent office visit.

## 2020-12-17 NOTE — Telephone Encounter (Signed)
Received response from [employer] regarding DaTscan. Per Medicare, no Berkley Harvey is required. Secondary of Monia Pouch will follow Medicare guidelines. Patient is ready for scheduling. Case #[id number]. Phone: [phone]. Sent to [employer] [facility] to schedule.

## 2020-12-19 NOTE — Telephone Encounter (Signed)
Per Ladona Ridgel at [facility], this patients voicemail is full and she cannot leave a message regarding scheduling. If patient calls our office regarding DaTscan she would like for [place] to forward the call to [phone].

## 2021-01-02 ENCOUNTER — Telehealth: Payer: Self-pay | Admitting: Family Medicine

## 2021-01-02 DIAGNOSIS — E7800 Pure hypercholesterolemia, unspecified: Secondary | ICD-10-CM

## 2021-01-02 MED ORDER — PRAVASTATIN SODIUM 80 MG PO TABS: ORAL_TABLET | ORAL | 3 refills | Status: DC

## 2021-01-02 NOTE — Telephone Encounter (Signed)
What is the name of the medication? Kerry Coleman [[id number]]   Have you contacted your pharmacy to request a refill? Pt is down to one pill for tomorrow then she will be out.   Which pharmacy would you like this sent to? Pharmacy  [id number] - [place], [place] - [address] AT [facility] [address]  [address] [place] [address]  Phone:  [phone]  Fax:  [phone]  DEA #:  [id number]     Patient notified that their request is being sent to the clinical staff for review and that they should receive a call once it is complete. If they do not receive a call within 72 hours they can check with their pharmacy or our office.

## 2021-01-02 NOTE — Telephone Encounter (Signed)
Done

## 2021-01-28 ENCOUNTER — Other Ambulatory Visit: Payer: Self-pay

## 2021-01-28 ENCOUNTER — Telehealth: Payer: Self-pay | Admitting: *Deleted

## 2021-01-28 ENCOUNTER — Ambulatory Visit (HOSPITAL_COMMUNITY)
Admission: RE | Admit: 2021-01-28 | Discharge: 2021-01-28 | Disposition: A | Discharge status: Home / Self Care | Payer: Medicare Other | Source: Ambulatory Visit | Attending: Neurology | Admitting: Neurology

## 2021-01-28 DIAGNOSIS — G20 Parkinson's disease: Secondary | ICD-10-CM | POA: Diagnosis present

## 2021-01-28 DIAGNOSIS — R251 Tremor, unspecified: Secondary | ICD-10-CM

## 2021-01-28 DIAGNOSIS — G20C Parkinsonism, unspecified: Secondary | ICD-10-CM

## 2021-01-28 MED ORDER — IOFLUPANE I 123 185 MBQ/2.5ML IV SOLN
5.0000 | Freq: Once | INTRAVENOUS | Status: AC | PRN
  Administered 2021-01-28: 5.2 via INTRAVENOUS
  Filled 2021-01-28: qty 5

## 2021-01-28 MED ORDER — POTASSIUM IODIDE (ANTIDOTE) 130 MG PO TABS: 130.0000 mg | ORAL_TABLET | Freq: Once | ORAL | Status: AC

## 2021-01-28 MED ORDER — POTASSIUM IODIDE (ANTIDOTE) 130 MG PO TABS
ORAL_TABLET | ORAL | Status: AC
  Administered 2021-01-28: 130 mg via ORAL
  Filled 2021-01-28: qty 1

## 2021-01-28 NOTE — Telephone Encounter (Signed)
-----   Message from Huston Foley, MD sent at 01/28/2021  3:50 PM EDT ----- Please call patient and advise her that her recent DaTscan was felt to be within normal limits and therefore does not support an underlying parkinsonian syndrome.  As far as her tremor goes, I would like to re-evaluate her in about 4 to 6 months.  Please assist with making a follow-up appointment.

## 2021-01-28 NOTE — Telephone Encounter (Signed)
Spoke with patient and discussed DaTscan results as noted below by Dr Frances Furbish. Patient verbalized understanding and will f/u with Dr Frances Furbish on Monday June 02, 2021 at 10:30 AM arriving at 10:00 AM. She verbalized appreciation for the call.

## 2021-02-18 ENCOUNTER — Other Ambulatory Visit: Payer: Self-pay | Admitting: Family Medicine

## 2021-02-18 DIAGNOSIS — M8588 Other specified disorders of bone density and structure, other site: Secondary | ICD-10-CM

## 2021-03-01 ENCOUNTER — Telehealth: Payer: Self-pay

## 2021-03-01 NOTE — Telephone Encounter (Signed)
Spoke to patient to schedule an AWV and she wants to wait till after her appt with Dr Doreene Burke on 03/10/21. Dm/cma

## 2021-03-10 ENCOUNTER — Other Ambulatory Visit: Payer: Self-pay

## 2021-03-10 ENCOUNTER — Encounter: Payer: Self-pay | Admitting: Family Medicine

## 2021-03-10 ENCOUNTER — Ambulatory Visit (INDEPENDENT_AMBULATORY_CARE_PROVIDER_SITE_OTHER): Payer: Medicare Other | Admitting: Family Medicine

## 2021-03-10 VITALS — BP 138/76 | HR 79 | Temp 97.2°F | Ht 65.0 in | Wt 187.0 lb

## 2021-03-10 DIAGNOSIS — I10 Essential (primary) hypertension: Secondary | ICD-10-CM

## 2021-03-10 DIAGNOSIS — R251 Tremor, unspecified: Secondary | ICD-10-CM

## 2021-03-10 DIAGNOSIS — N3941 Urge incontinence: Secondary | ICD-10-CM | POA: Diagnosis not present

## 2021-03-10 DIAGNOSIS — Z23 Encounter for immunization: Secondary | ICD-10-CM | POA: Diagnosis not present

## 2021-03-10 NOTE — Progress Notes (Signed)
Established Patient Office Visit  Subjective:  Patient ID: Kerry Coleman, female    DOB: [birth year]  Age: 86 y.o. MRN: [id number]  CC:  Chief Complaint  Patient presents with   Follow-up    Routine follow up patient would like to discuss medications.     HPI Kerry Coleman presents for follow-up of hypertension, urge incontinence, tremor and vaccination for influenza.  Neurology believes the tremor is benign.  Taking Vibegron for urge incontinence.  It seems to be helping.  It is expensive but her insurance is covering it..  She is having few side effects.  Remains active physically and mentally.  Enjoying her Renette Butters years with her husband.  No past medical history on file.  Past Surgical History:  Procedure Laterality Date   BACK SURGERY     BREAST BIOPSY Left    @ age 72, Benign    BUNIONECTOMY     KNEE SURGERY     right carpal tunnel release     right index finger excision of mass     ROTATOR CUFF REPAIR     SPINE SURGERY     TONSILLECTOMY      Family History  Problem Relation Age of Onset   Heart disease Mother    Stroke Mother    Arthritis Father    Heart disease Father    Hyperlipidemia Sister    Hypertension Sister    Arthritis Sister    Cancer Sister    Breast cancer Sister 94   Hypertension Son    Hyperlipidemia Son    Breast cancer Cousin     Social History   Socioeconomic History   Marital status: Married    Spouse name: Not on file   Number of children: Not on file   Years of education: Not on file   Highest education level: Not on file  Occupational History   Not on file  Tobacco Use   Smoking status: Never   Smokeless tobacco: Never  Substance and Sexual Activity   Alcohol use: Yes    Alcohol/week: 7.0 standard drinks    Types: 7 Glasses of wine per week    Comment: 1 glass of red wine daily.    Drug use: Never   Sexual activity: Not on file  Other Topics Concern   Not on file  Social History Narrative   Not on file   Social  Determinants of Health   Financial Resource Strain: Not on file  Food Insecurity: Not on file  Transportation Needs: Not on file  Physical Activity: Not on file  Stress: Not on file  Social Connections: Not on file  Intimate Partner Violence: Not on file    Outpatient Medications Prior to Visit  Medication Sig Dispense Refill   calcium carbonate (OS-CAL) 600 MG tablet Take 1 tablet (600 mg total) by mouth 2 (two) times daily. 180 tablet 4   chlorthalidone (HYGROTON) 25 MG tablet TAKE 1/2 TABLET BY MOUTH DAILY 100 tablet 1   metoprolol succinate (TOPROL-XL) 50 MG 24 hr tablet TAKE 1 TABLET(50 MG) BY MOUTH DAILY WITH OR IMMEDIATELY FOLLOWING A MEAL 90 tablet 3   Multiple Vitamins-Minerals (CENTRUM SILVER 50+WOMEN PO) Centrum Silver Women     naproxen sodium (ALEVE) 220 MG tablet Take 220 mg by mouth.     pravastatin (PRAVACHOL) 80 MG tablet TAKE 1 TABLET(80 MG) BY MOUTH DAILY 90 tablet 3   Vibegron (GEMTESA) 75 MG TABS Take by mouth.     Vitamin D, Cholecalciferol, 25  MCG (1000 UT) CAPS TAKE 1 CAPSULE BY MOUTH EVERY DAY 180 capsule 0   phenazopyridine (PYRIDIUM) 100 MG tablet Take 1 tablet (100 mg total) by mouth 3 (three) times daily as needed for pain. (Patient not taking: Reported on 03/10/2021) 10 tablet 0   ibuprofen (ADVIL) 200 MG tablet Take 200 mg by mouth every 6 (six) hours as needed.     No facility-administered medications prior to visit.    No Known Allergies  ROS Review of Systems  Constitutional:  Negative for chills, diaphoresis, fatigue, fever and unexpected weight change.  HENT: Negative.    Eyes:  Negative for photophobia and visual disturbance.  Respiratory: Negative.    Cardiovascular: Negative.   Gastrointestinal: Negative.   Genitourinary:  Positive for frequency. Negative for dysuria and urgency.  Musculoskeletal:  Negative for gait problem and joint swelling.  Neurological:  Negative for speech difficulty and weakness.  Psychiatric/Behavioral: Negative.        Objective:    Physical Exam Vitals and nursing note reviewed.  Constitutional:      General: She is not in acute distress.    Appearance: Normal appearance. She is not ill-appearing, toxic-appearing or diaphoretic.  HENT:     Head: Normocephalic and atraumatic.     Right Ear: Tympanic membrane, ear canal and external ear normal.     Left Ear: Tympanic membrane, ear canal and external ear normal.     Mouth/Throat:     Mouth: Mucous membranes are moist.     Pharynx: Oropharynx is clear. No oropharyngeal exudate or posterior oropharyngeal erythema.  Eyes:     Extraocular Movements: Extraocular movements intact.     Conjunctiva/sclera: Conjunctivae normal.     Pupils: Pupils are equal, round, and reactive to light.  Neck:     Vascular: No carotid bruit.  Cardiovascular:     Rate and Rhythm: Normal rate and regular rhythm. Occasional Extrasystoles are present. Pulmonary:     Effort: Pulmonary effort is normal.     Breath sounds: Normal breath sounds.  Abdominal:     General: Bowel sounds are normal.  Musculoskeletal:     Cervical back: No rigidity or tenderness.     Right lower leg: No edema.     Left lower leg: No edema.  Lymphadenopathy:     Cervical: No cervical adenopathy.  Skin:    General: Skin is warm and dry.  Neurological:     Mental Status: She is alert and oriented to person, place, and time.  Psychiatric:        Mood and Affect: Mood normal.        Behavior: Behavior normal.    BP 138/76 (BP Location: Right Arm, Patient Position: Sitting, Cuff Size: Normal)   Pulse 79   Temp (!) 97.2 F (36.2 C) (Temporal)   Ht 5\' 5"  (1.651 m)   Wt 187 lb (84.8 kg)   SpO2 96%   BMI 31.12 kg/m  Wt Readings from Last 3 Encounters:  03/10/21 187 lb (84.8 kg)  12/10/20 187 lb (84.8 kg)  11/01/20 188 lb 6.4 oz (85.5 kg)     There are no preventive care reminders to display for this patient.   There are no preventive care reminders to display for this  patient.  Lab Results  Component Value Date   TSH 1.15 09/26/2020   Lab Results  Component Value Date   WBC 4.3 09/26/2020   HGB 14.3 09/26/2020   HCT 42.6 09/26/2020   MCV 95.0 09/26/2020  PLT 216.0 09/26/2020   Lab Results  Component Value Date   NA 141 09/26/2020   K 4.1 09/26/2020   CO2 33 (H) 09/26/2020   GLUCOSE 101 (H) 09/26/2020   BUN 19 09/26/2020   CREATININE 0.67 09/26/2020   BILITOT 1.1 09/26/2020   ALKPHOS 74 09/26/2020   AST 24 09/26/2020   ALT 22 09/26/2020   PROT 6.8 09/26/2020   ALBUMIN 4.5 09/26/2020   CALCIUM 10.4 09/26/2020   GFR 80.48 09/26/2020   Lab Results  Component Value Date   CHOL 256 (H) 09/26/2020   Lab Results  Component Value Date   HDL 78.40 09/26/2020   Lab Results  Component Value Date   LDLCALC 154 (H) 09/26/2020   Lab Results  Component Value Date   TRIG 115.0 09/26/2020   Lab Results  Component Value Date   CHOLHDL 3 09/26/2020   No results found for: HGBA1C    Assessment & Plan:   Problem List Items Addressed This Visit       Cardiovascular and Mediastinum   Essential hypertension     Other   Urge incontinence   Tremor of left hand   Other Visit Diagnoses     Need for influenza vaccination    -  Primary   Relevant Orders   Flu Vaccine QUAD High Dose(Fluad) (Completed)       No orders of the defined types were placed in this encounter.   Follow-up: Return in about 3 months (around 06/09/2021).  Continue chlorthalidone and metoprolol for blood pressure.  Continue pravastatin for cholesterol.  Continue to Vibegron.  Vaginal estrogen may be an alternative.  Continue exercising.  Mliss Sax, MD

## 2021-03-12 ENCOUNTER — Telehealth: Payer: Self-pay | Admitting: Family Medicine

## 2021-03-12 NOTE — Telephone Encounter (Signed)
Left message for patient to call back and schedule Medicare Annual Wellness Visit (AWV).   Please offer to do virtually or by telephone.  Left office number and my jabber [phone].  Last AWV:10/25/2019  Please schedule at anytime with Nurse Health Advisor.

## 2021-03-28 ENCOUNTER — Ambulatory Visit: Payer: Medicare Other | Admitting: Family Medicine

## 2021-06-02 ENCOUNTER — Other Ambulatory Visit: Payer: Self-pay

## 2021-06-02 ENCOUNTER — Ambulatory Visit (INDEPENDENT_AMBULATORY_CARE_PROVIDER_SITE_OTHER): Payer: Medicare Other | Admitting: Neurology

## 2021-06-02 ENCOUNTER — Encounter: Payer: Self-pay | Admitting: Neurology

## 2021-06-02 VITALS — BP 160/88 | HR 82 | Ht 65.0 in | Wt 188.8 lb

## 2021-06-02 DIAGNOSIS — R251 Tremor, unspecified: Secondary | ICD-10-CM

## 2021-06-02 NOTE — Progress Notes (Signed)
Subjective:    Patient ID: Carmell Elgin is a 86 y.o. female.  HPI    Interim history:   Ms. Dipinto is an 86 year old right-handed woman with an underlying medical history of arthritis, status post bilateral knee replacements, status post left shoulder rotator cuff surgery, significant arthritis affecting her hands and right more than left feet, status post carpal tunnel surgery, status post back surgery twice, and mild obesity, who presents for follow-up consultation of her hand tremor.  The patient is accompanied by her husband today.  I first met her at the request of her primary care physician on 12/10/2020, at which time she reported an approximately 3-year history of hand tremors, noticed that primarily in the right hand first.  She did not have any telltale signs of parkinsonism but did have some subtle signs including a resting tremor on examination.  She did not have a family history of tremors, in particular, her identical twin sister did not have a tremor.  She was advised to proceed with a DaTscan.  She was advised to consider limiting her alcohol consumption to less than 1/day.  She was advised to stay well-hydrated with water.  She had a brain DaT scan on 01/28/21, and I reviewed the results: IMPRESSION: No reduced radiotracer activity in basal ganglia to suggest Parkinson's syndrome pathology. Study favored within normal limits.   Of note, DaTSCAN is not diagnostic of Parkinsonian syndromes, which remains a clinical diagnosis. DaTscan is an adjuvant test to aid in the clinical diagnosis of Parkinsonian syndromes.   Today, 06/02/21:   The patient's allergies, current medications, family history, past medical history, past social history, past surgical history and problem list were reviewed and updated as appropriate.   Previously:   12/10/20: (She) reports a approximately 3-year history of tremors affecting primarily her right hand.  It started about 3 years ago, she was still  living in [place] at the time.  They moved about 2 years ago.  She had previously seen her primary care physician in [place] for the tremor and was reassured.  She has noticed progression of her tremor and has noticed in the past few months that it is affecting her left hand as well.  She is not impaired in her day-to-day functioning but is worried about this tremor.  She has a twin sister who does not have a tremor, no other family history of tremors.  No family history of Parkinson's disease.  She has not been sleeping very well.  She has nocturia.  She has some sleepiness during the day.  She has difficulty staying asleep.  She has never had a sleep study.  Her older sister passed away at the age of 106 from breast cancer.  She has 3 grown sons, 1 lives in [place], the other in [place] and 1 lives in [place].  She has noticed that stress makes it worse.  She has had some stress, she takes care of her husband.  She has not been drinking caffeine on a day-to-day basis, decaf coffee typically.  She does drink up to 2 glasses of wine per day.  She had recent blood work through your office on 09/26/2020 and I was able to review the results.  CMP was unremarkable, BUN 19, creatinine 0.67.  Lipid panel showed cholesterol of 256, LDL elevated at 154, urinalysis unremarkable.  Vitamin D 38.17, TSH normal at 1.15, CBC without differential normal.  She is a non-smoker.  She has not noticed much in the way  of fine motor dyscontrol, she has not had any recent falls.    Her Past Medical History Is Significant For: History reviewed. No pertinent past medical history.  Her Past Surgical History Is Significant For: Past Surgical History:  Procedure Laterality Date   BACK SURGERY     BREAST BIOPSY Left    @ age 41, Benign    BUNIONECTOMY     KNEE SURGERY     right carpal tunnel release     right index finger excision of mass     ROTATOR CUFF REPAIR     SPINE SURGERY     TONSILLECTOMY      Her  Family History Is Significant For: Family History  Problem Relation Age of Onset   Heart disease Mother    Stroke Mother    Arthritis Father    Heart disease Father    Hyperlipidemia Sister    Hypertension Sister    Arthritis Sister    Cancer Sister    Breast cancer Sister 47   Hypertension Son    Hyperlipidemia Son    Breast cancer Cousin     Her Social History Is Significant For: Social History   Socioeconomic History   Marital status: Married    Spouse name: Not on file   Number of children: Not on file   Years of education: Not on file   Highest education level: Not on file  Occupational History   Not on file  Tobacco Use   Smoking status: Never   Smokeless tobacco: Never  Substance and Sexual Activity   Alcohol use: Yes    Alcohol/week: 7.0 standard drinks    Types: 7 Glasses of wine per week    Comment: 1 glass of red wine daily.    Drug use: Never   Sexual activity: Not on file  Other Topics Concern   Not on file  Social History Narrative   Not on file   Social Determinants of Health   Financial Resource Strain: Not on file  Food Insecurity: Not on file  Transportation Needs: Not on file  Physical Activity: Not on file  Stress: Not on file  Social Connections: Not on file    Her Allergies Are:  No Known Allergies:   Her Current Medications Are:  Outpatient Encounter Medications as of 06/02/2021  Medication Sig   calcium carbonate (OS-CAL) 600 MG tablet Take 1 tablet (600 mg total) by mouth 2 (two) times daily.   chlorthalidone (HYGROTON) 25 MG tablet TAKE 1/2 TABLET BY MOUTH DAILY   metoprolol succinate (TOPROL-XL) 50 MG 24 hr tablet TAKE 1 TABLET(50 MG) BY MOUTH DAILY WITH OR IMMEDIATELY FOLLOWING A MEAL   Multiple Vitamins-Minerals (CENTRUM SILVER 50+WOMEN PO) Centrum Silver Women   naproxen sodium (ALEVE) 220 MG tablet Take 220 mg by mouth.   pravastatin (PRAVACHOL) 80 MG tablet TAKE 1 TABLET(80 MG) BY MOUTH DAILY   Vitamin D,  Cholecalciferol, 25 MCG (1000 UT) CAPS TAKE 1 CAPSULE BY MOUTH EVERY DAY   Vibegron (GEMTESA) 75 MG TABS Take by mouth. (Patient not taking: Reported on 06/02/2021)   [DISCONTINUED] phenazopyridine (PYRIDIUM) 100 MG tablet Take 1 tablet (100 mg total) by mouth 3 (three) times daily as needed for pain. (Patient not taking: Reported on 03/10/2021)   No facility-administered encounter medications on file as of 06/02/2021.  :  Review of Systems:  Out of a complete 14 point review of systems, all are reviewed and negative with the exception of these symptoms as listed below:  Review of Systems  Neurological:        F/u tremor, no change.  Notices increases when stressed.     Objective:  Neurological Exam  Physical Exam Physical Examination:   Vitals:   06/02/21 1041  BP: (!) 160/88  Pulse: 82    General Examination: The patient is a very pleasant 86 y.o. female in no acute distress. She appears well-developed and well-nourished and well groomed.   HEENT: Normocephalic, atraumatic, pupils are equal, round and reactive to light, extraocular tracking is well-preserved, hearing is grossly intact, she has hearing aids.  She has corrective eyeglasses in place.  Face is symmetric, no significant facial masking noted, perhaps mild nuchal rigidity noted.  She has no lip, neck or jaw tremor, no significant hypophonia, no voice tremor.  No carotid bruits.  Airway examination reveals a small airway entry, tonsils are absent.  Tongue protrudes centrally and palate elevates symmetrically, tongue movements are good.     Chest: Clear to auscultation without wheezing, rhonchi or crackles noted.   Heart: S1+S2+0, regular and normal without murmurs, rubs or gallops noted.    Abdomen: Soft, non-tender and non-distended with normal bowel sounds appreciated on auscultation.   Extremities: There is trace pitting edema in the distal lower extremities bilaterally. Pedal pulses are intact.   Skin: Warm and dry  without trophic changes noted.   Musculoskeletal: exam reveals decreased range of motion in both knees, status post bilateral knee replacement surgeries.  She has prominent arthritic changes of both hands.     Neurologically:  Mental status: The patient is awake, alert and oriented in all 4 spheres. Her immediate and remote memory, attention, language skills and fund of knowledge are appropriate. There is no evidence of aphasia, agnosia, apraxia or anomia. Speech is clear with normal prosody and enunciation. Thought process is linear. Mood is normal and affect is normal.  Cranial nerves II - XII are as described above under HEENT exam.   Motor exam: Normal bulk, strength and tone is noted. There is no drift, or rebound. Romberg is not tested for safety concerns.  Reflexes are 1+ in the upper extremities and absent in the lower extremities.  On fine motor skills testing she does have difficulty with finger taps and hand movements, right side a little worse than left, likely due to arthritis in both hands.  She has a mild and intermittent resting tremor in the right more than left upper extremities. She has a very minimal postural tremor in both upper extremities, no significant action tremor, no intention tremor.     (On 12/10/2020: On Archimedes spiral drawing she has no significant trembling with the right or left hand, mild insecurity with her nondominant/left hand.  Handwriting is slightly tremulous, not particularly micrographic and well legible.)  Cerebellar testing: No dysmetria or intention tremor on finger to nose testing. Heel to shin is difficult, but she has no dysmetria with the lower extremity test. Sensory exam: intact to light touch in the upper and lower extremities.  Gait, station and balance: She stands with without major difficulty, posture is age-appropriate, walks with fairly good stride length and pace, no significant change in arm swing bilaterally.    Assessment and Plan:     In summary, Nathalee Smarr is a very pleasant 86 year old female with an underlying medical history of arthritis, status post bilateral knee replacements, status post left shoulder rotator cuff surgery, significant arthritis affecting her hands and right more than left feet, status post carpal tunnel  surgery, status post back surgery twice, and mild obesity, who presents for follow-up consultation of her tremor, affecting both hands.  She has had a tremor for approximately 3-1/2 years or longer.  She has noticed it more on the right side.  Examination is not much changed from June 2022.  She has a mild intermittent resting tremor in the right upper extremity but no significant parkinsonian signs, history is not telltale for essential tremor but she could have a mild form of essential tremor, certainly worth monitoring.  We talked about supportive treatment such as good hydration, limiting caffeine and limiting alcohol.  She has in fact tried to limit herself to 1 glass of wine per day.  She was typically averaging about 2 glasses of wine before.  She is encouraged to drink a little bit more water, she averages currently 3 to 4 cups/day.  She is advised to continue with her current medications.  She is not keen on any additional symptomatic treatment at this time and I would like to avoid any new medications at this point.  DaTscan from 01/28/2021 was not supportive of an underlying parkinsonian syndrome.  At this juncture, we mutually agreed to continue to monitor her symptoms and examination.  She is advised to follow-up routinely in this clinic in about 6 months, sooner if needed.  I answered all the questions today and the patient and her husband were in agreement. I spent 30 minutes in total face-to-face time and in reviewing records during pre-charting, more than 50% of which was spent in counseling and coordination of care, reviewing test results, reviewing medications and treatment regimen and/or in  discussing or reviewing the diagnosis of tremor, the prognosis and treatment options. Pertinent laboratory and imaging test results that were available during this visit with the patient were reviewed by me and considered in my medical decision making (see chart for details).

## 2021-06-09 ENCOUNTER — Ambulatory Visit: Payer: Medicare Other | Admitting: Family Medicine

## 2021-06-17 ENCOUNTER — Encounter: Payer: Self-pay | Admitting: Family Medicine

## 2021-06-17 ENCOUNTER — Other Ambulatory Visit: Payer: Self-pay

## 2021-06-17 ENCOUNTER — Ambulatory Visit (INDEPENDENT_AMBULATORY_CARE_PROVIDER_SITE_OTHER): Payer: Medicare Other | Admitting: Family Medicine

## 2021-06-17 VITALS — BP 158/92 | HR 93 | Temp 97.5°F | Ht 65.0 in | Wt 187.6 lb

## 2021-06-17 DIAGNOSIS — I10 Essential (primary) hypertension: Secondary | ICD-10-CM | POA: Diagnosis not present

## 2021-06-17 DIAGNOSIS — E559 Vitamin D deficiency, unspecified: Secondary | ICD-10-CM | POA: Diagnosis not present

## 2021-06-17 DIAGNOSIS — Z6379 Other stressful life events affecting family and household: Secondary | ICD-10-CM | POA: Diagnosis not present

## 2021-06-17 DIAGNOSIS — E7800 Pure hypercholesterolemia, unspecified: Secondary | ICD-10-CM | POA: Diagnosis not present

## 2021-06-17 NOTE — Progress Notes (Signed)
Established Patient Office Visit  Subjective:  Patient ID: Kerry Coleman, female    DOB: [birth year]  Age: 86 y.o. MRN: [id number]  CC:  Chief Complaint  Patient presents with   Follow-up    3 month follow up, no concerns.     HPI Kerry Coleman presents for follow-up of hypertension, elevated cholesterol vitamin D deficiency and recent family stress.  Twin sister recently diagnosed with breast cancer.  Patient is status post normal mammogram and March of this year.  No evidence at this time for Parkinson's disease.  She was advised to cut back on alcohol and caffeine consumption she has done this.  Blood pressure at home is in the 1 20-1 30 over 70s range.  No longer supplementing with vitamin D.  No past medical history on file.  Past Surgical History:  Procedure Laterality Date   BACK SURGERY     BREAST BIOPSY Left    @ age 64, Benign    BUNIONECTOMY     KNEE SURGERY     right carpal tunnel release     right index finger excision of mass     ROTATOR CUFF REPAIR     SPINE SURGERY     TONSILLECTOMY      Family History  Problem Relation Age of Onset   Heart disease Mother    Stroke Mother    Arthritis Father    Heart disease Father    Hyperlipidemia Sister    Hypertension Sister    Arthritis Sister    Cancer Sister    Breast cancer Sister 59   Hypertension Son    Hyperlipidemia Son    Breast cancer Cousin     Social History   Socioeconomic History   Marital status: Married    Spouse name: Not on file   Number of children: Not on file   Years of education: Not on file   Highest education level: Not on file  Occupational History   Not on file  Tobacco Use   Smoking status: Never   Smokeless tobacco: Never  Substance and Sexual Activity   Alcohol use: Yes    Alcohol/week: 7.0 standard drinks    Types: 7 Glasses of wine per week    Comment: 1 glass of red wine daily.    Drug use: Never   Sexual activity: Not on file  Other Topics Concern   Not on file   Social History Narrative   Not on file   Social Determinants of Health   Financial Resource Strain: Not on file  Food Insecurity: Not on file  Transportation Needs: Not on file  Physical Activity: Not on file  Stress: Not on file  Social Connections: Not on file  Intimate Partner Violence: Not on file    Outpatient Medications Prior to Visit  Medication Sig Dispense Refill   calcium carbonate (OS-CAL) 600 MG tablet Take 1 tablet (600 mg total) by mouth 2 (two) times daily. 180 tablet 4   chlorthalidone (HYGROTON) 25 MG tablet TAKE 1/2 TABLET BY MOUTH DAILY 100 tablet 1   metoprolol succinate (TOPROL-XL) 50 MG 24 hr tablet TAKE 1 TABLET(50 MG) BY MOUTH DAILY WITH OR IMMEDIATELY FOLLOWING A MEAL 90 tablet 3   Multiple Vitamins-Minerals (CENTRUM SILVER 50+WOMEN PO) Centrum Silver Women     naproxen sodium (ALEVE) 220 MG tablet Take 220 mg by mouth.     pravastatin (PRAVACHOL) 80 MG tablet TAKE 1 TABLET(80 MG) BY MOUTH DAILY 90 tablet 3   Vitamin  D, Cholecalciferol, 25 MCG (1000 UT) CAPS TAKE 1 CAPSULE BY MOUTH EVERY DAY 180 capsule 0   Vibegron (GEMTESA) 75 MG TABS Take by mouth. (Patient not taking: Reported on 06/17/2021)     No facility-administered medications prior to visit.    No Known Allergies  ROS Review of Systems  Constitutional:  Negative for chills, diaphoresis, fatigue, fever and unexpected weight change.  HENT: Negative.    Eyes:  Negative for photophobia and visual disturbance.  Respiratory: Negative.    Cardiovascular: Negative.   Gastrointestinal: Negative.   Endocrine: Negative for polyphagia and polyuria.  Genitourinary: Negative.   Musculoskeletal:  Negative for gait problem.  Skin:  Negative for pallor.  Neurological:  Negative for speech difficulty and weakness.  Psychiatric/Behavioral: Negative.       Objective:    Physical Exam Vitals and nursing note reviewed.  Constitutional:      General: She is not in acute distress.    Appearance:  Normal appearance. She is not ill-appearing or toxic-appearing.  HENT:     Head: Normocephalic and atraumatic.     Right Ear: External ear normal.     Left Ear: External ear normal.     Mouth/Throat:     Mouth: Mucous membranes are dry.     Pharynx: Oropharynx is clear.  Eyes:     General: No scleral icterus.       Right eye: No discharge.        Left eye: No discharge.     Extraocular Movements: Extraocular movements intact.     Conjunctiva/sclera: Conjunctivae normal.     Pupils: Pupils are equal, round, and reactive to light.  Neck:     Vascular: No carotid bruit.  Cardiovascular:     Rate and Rhythm: Normal rate and regular rhythm.  Pulmonary:     Effort: Pulmonary effort is normal.     Breath sounds: Normal breath sounds.  Musculoskeletal:     Cervical back: No rigidity or tenderness.  Lymphadenopathy:     Cervical: No cervical adenopathy.  Skin:    General: Skin is warm and dry.  Neurological:     Mental Status: She is alert and oriented to person, place, and time.  Psychiatric:        Mood and Affect: Mood normal.        Behavior: Behavior normal.    BP (!) 158/92 (BP Location: Right Arm, Patient Position: Sitting, Cuff Size: Normal)    Pulse 93    Temp (!) 97.5 F (36.4 C) (Temporal)    Ht 5\' 5"  (1.651 m)    Wt 187 lb 9.6 oz (85.1 kg)    SpO2 96%    BMI 31.22 kg/m  Wt Readings from Last 3 Encounters:  06/17/21 187 lb 9.6 oz (85.1 kg)  06/02/21 188 lb 12.8 oz (85.6 kg)  03/10/21 187 lb (84.8 kg)     There are no preventive care reminders to display for this patient.  There are no preventive care reminders to display for this patient.  Lab Results  Component Value Date   TSH 1.15 09/26/2020   Lab Results  Component Value Date   WBC 4.3 09/26/2020   HGB 14.3 09/26/2020   HCT 42.6 09/26/2020   MCV 95.0 09/26/2020   PLT 216.0 09/26/2020   Lab Results  Component Value Date   NA 141 09/26/2020   K 4.1 09/26/2020   CO2 33 (H) 09/26/2020   GLUCOSE 101  (H) 09/26/2020   BUN 19 09/26/2020  CREATININE 0.67 09/26/2020   BILITOT 1.1 09/26/2020   ALKPHOS 74 09/26/2020   AST 24 09/26/2020   ALT 22 09/26/2020   PROT 6.8 09/26/2020   ALBUMIN 4.5 09/26/2020   CALCIUM 10.4 09/26/2020   GFR 80.48 09/26/2020   Lab Results  Component Value Date   CHOL 256 (H) 09/26/2020   Lab Results  Component Value Date   HDL 78.40 09/26/2020   Lab Results  Component Value Date   LDLCALC 154 (H) 09/26/2020   Lab Results  Component Value Date   TRIG 115.0 09/26/2020   Lab Results  Component Value Date   CHOLHDL 3 09/26/2020   No results found for: HGBA1C   The ASCVD Risk score (Arnett DK, et al., 2019) failed to calculate for the following reasons:   The 2019 ASCVD risk score is only valid for ages 55 to 85  Assessment & Plan:   Problem List Items Addressed This Visit       Cardiovascular and Mediastinum   Essential hypertension   Relevant Orders   Basic metabolic panel   CBC     Other   Elevated cholesterol   Relevant Orders   Lipid panel   Vitamin D deficiency   Relevant Orders   VITAMIN D 25 Hydroxy (Vit-D Deficiency, Fractures)   Stressful life events affecting family and household - Primary    No orders of the defined types were placed in this encounter.   Follow-up: Return in about 3 months (around 09/15/2021), or Check and record blood pressures and bring cuff next visit, for Return fasting for above ordered blood work.    Libby Maw, MD

## 2021-06-20 ENCOUNTER — Other Ambulatory Visit: Payer: Self-pay

## 2021-06-20 ENCOUNTER — Other Ambulatory Visit (INDEPENDENT_AMBULATORY_CARE_PROVIDER_SITE_OTHER): Payer: Medicare Other

## 2021-06-20 DIAGNOSIS — I10 Essential (primary) hypertension: Secondary | ICD-10-CM | POA: Diagnosis not present

## 2021-06-20 DIAGNOSIS — E7800 Pure hypercholesterolemia, unspecified: Secondary | ICD-10-CM | POA: Diagnosis not present

## 2021-06-20 DIAGNOSIS — E559 Vitamin D deficiency, unspecified: Secondary | ICD-10-CM | POA: Diagnosis not present

## 2021-06-20 LAB — LIPID PANEL
Cholesterol: 240 mg/dL — ABNORMAL HIGH (ref 0–200)
HDL: 70.6 mg/dL (ref >39.00)
LDL Cholesterol: 140 mg/dL — ABNORMAL HIGH (ref 0–99)
NonHDL: 169.35
Total CHOL/HDL Ratio: 3
Triglycerides: 149 mg/dL (ref 0.0–149.0)
VLDL: 29.8 mg/dL (ref 0.0–40.0)

## 2021-06-20 LAB — CBC
HCT: 43.2 % (ref 36.0–46.0)
Hemoglobin: 14.1 g/dL (ref 12.0–15.0)
MCHC: 32.6 g/dL (ref 30.0–36.0)
MCV: 94.4 fl (ref 78.0–100.0)
Platelets: 215 10*3/uL (ref 150.0–400.0)
RBC: 4.58 Mil/uL (ref 3.87–5.11)
RDW: 13.7 % (ref 11.5–15.5)
WBC: 8.1 10*3/uL (ref 4.0–10.5)

## 2021-06-20 LAB — BASIC METABOLIC PANEL
BUN: 30 mg/dL — ABNORMAL HIGH (ref 6–23)
CO2: 28 mEq/L (ref 19–32)
Calcium: 9.6 mg/dL (ref 8.4–10.5)
Chloride: 100 mEq/L (ref 96–112)
Creatinine, Ser: 0.7 mg/dL (ref 0.40–1.20)
GFR: 79.23 mL/min (ref >60.00)
Glucose, Bld: 96 mg/dL (ref 70–99)
Potassium: 4 mEq/L (ref 3.5–5.1)
Sodium: 138 mEq/L (ref 135–145)

## 2021-06-20 LAB — VITAMIN D 25 HYDROXY (VIT D DEFICIENCY, FRACTURES): VITD: 42.69 ng/mL (ref 30.00–100.00)

## 2021-06-20 NOTE — Progress Notes (Signed)
Per the orders of  Dr.Kremer pt is here for labs, pt tolerated draw well. ? ?

## 2021-07-21 ENCOUNTER — Telehealth: Payer: Self-pay | Admitting: Family Medicine

## 2021-07-21 ENCOUNTER — Other Ambulatory Visit (HOSPITAL_BASED_OUTPATIENT_CLINIC_OR_DEPARTMENT_OTHER): Payer: Self-pay | Admitting: Family Medicine

## 2021-07-21 DIAGNOSIS — Z1231 Encounter for screening mammogram for malignant neoplasm of breast: Secondary | ICD-10-CM

## 2021-07-21 NOTE — Telephone Encounter (Signed)
Left message for patient to call back and schedule Medicare Annual Wellness Visit (AWV) in office.   If not able to come in office, please offer to do virtually or by telephone.  Left office number and my jabber [phone].  Last AWV:10/25/2019  Please schedule at anytime with Nurse Health Advisor.

## 2021-08-21 ENCOUNTER — Other Ambulatory Visit: Payer: Self-pay

## 2021-08-21 ENCOUNTER — Ambulatory Visit (HOSPITAL_BASED_OUTPATIENT_CLINIC_OR_DEPARTMENT_OTHER)
Admission: RE | Admit: 2021-08-21 | Discharge: 2021-08-21 | Disposition: A | Discharge status: Home / Self Care | Payer: Medicare Other | Source: Ambulatory Visit | Attending: Family Medicine | Admitting: Family Medicine

## 2021-08-21 ENCOUNTER — Encounter (HOSPITAL_BASED_OUTPATIENT_CLINIC_OR_DEPARTMENT_OTHER): Payer: Self-pay

## 2021-08-21 DIAGNOSIS — Z1231 Encounter for screening mammogram for malignant neoplasm of breast: Secondary | ICD-10-CM | POA: Insufficient documentation

## 2021-09-18 ENCOUNTER — Ambulatory Visit (INDEPENDENT_AMBULATORY_CARE_PROVIDER_SITE_OTHER): Payer: Medicare Other | Admitting: Family Medicine

## 2021-09-18 ENCOUNTER — Encounter: Payer: Self-pay | Admitting: Family Medicine

## 2021-09-18 VITALS — BP 154/82 | HR 69 | Temp 97.3°F | Ht 65.0 in | Wt 183.2 lb

## 2021-09-18 DIAGNOSIS — I10 Essential (primary) hypertension: Secondary | ICD-10-CM | POA: Diagnosis not present

## 2021-09-18 DIAGNOSIS — E7800 Pure hypercholesterolemia, unspecified: Secondary | ICD-10-CM

## 2021-09-18 DIAGNOSIS — R4582 Worries: Secondary | ICD-10-CM | POA: Insufficient documentation

## 2021-09-18 LAB — BASIC METABOLIC PANEL
BUN: 16 mg/dL (ref 6–23)
CO2: 31 mEq/L (ref 19–32)
Calcium: 10.3 mg/dL (ref 8.4–10.5)
Chloride: 102 mEq/L (ref 96–112)
Creatinine, Ser: 0.63 mg/dL (ref 0.40–1.20)
GFR: 81.13 mL/min (ref >60.00)
Glucose, Bld: 107 mg/dL — ABNORMAL HIGH (ref 70–99)
Potassium: 4 mEq/L (ref 3.5–5.1)
Sodium: 140 mEq/L (ref 135–145)

## 2021-09-18 LAB — LIPID PANEL
Cholesterol: 235 mg/dL — ABNORMAL HIGH (ref 0–200)
HDL: 71.6 mg/dL (ref >39.00)
LDL Cholesterol: 133 mg/dL — ABNORMAL HIGH (ref 0–99)
NonHDL: 163.02
Total CHOL/HDL Ratio: 3
Triglycerides: 151 mg/dL — ABNORMAL HIGH (ref 0.0–149.0)
VLDL: 30.2 mg/dL (ref 0.0–40.0)

## 2021-09-18 MED ORDER — CHLORTHALIDONE 25 MG PO TABS: 25.0000 mg | ORAL_TABLET | Freq: Every day | ORAL | 1 refills | Status: DC

## 2021-09-18 NOTE — Progress Notes (Addendum)
? ?Established Patient Office Visit ? ?Subjective:  ?Patient ID: Kerry Coleman, female    DOB: Jan 29, 1935  Age: 87 y.o. MRN: [id number] ? ?CC:  ?Chief Complaint  ?Patient presents with  ? Follow-up  ?  3 month follow up on BP. Patient fatsing.   ? ? ?HPI ?Kerry Coleman presents for follow-up of hypertension and elevated ldl cholesterol.  She has decreased the salt, fat and cholesterol in her diet.  She is fasting today.  Drinking no more than 1 glass of wine daily.  Blood pressure at home has been running 140s over ? ?No past medical history on file. ? ?Past Surgical History:  ?Procedure Laterality Date  ? BACK SURGERY    ? BREAST BIOPSY Left   ? @ age 27, Benign   ? BUNIONECTOMY    ? KNEE SURGERY    ? right carpal tunnel release    ? right index finger excision of mass    ? ROTATOR CUFF REPAIR    ? SPINE SURGERY    ? TONSILLECTOMY    ? ? ?Family History  ?Problem Relation Age of Onset  ? Heart disease Mother   ? Stroke Mother   ? Arthritis Father   ? Heart disease Father   ? Hyperlipidemia Sister   ? Hypertension Sister   ? Arthritis Sister   ? Cancer Sister   ? Breast cancer Sister 74  ? Hypertension Son   ? Hyperlipidemia Son   ? Breast cancer Cousin   ? ? ?Social History  ? ?Socioeconomic History  ? Marital status: Married  ?  Spouse name: Not on file  ? Number of children: Not on file  ? Years of education: Not on file  ? Highest education level: Not on file  ?Occupational History  ? Not on file  ?Tobacco Use  ? Smoking status: Never  ? Smokeless tobacco: Never  ?Substance and Sexual Activity  ? Alcohol use: Yes  ?  Alcohol/week: 7.0 standard drinks  ?  Types: 7 Glasses of wine per week  ?  Comment: 1 glass of red wine daily.   ? Drug use: Never  ? Sexual activity: Not on file  ?Other Topics Concern  ? Not on file  ?Social History Narrative  ? Not on file  ? ?Social Determinants of Health  ? ?Financial Resource Strain: Not on file  ?Food Insecurity: Not on file  ?Transportation Needs: Not on file  ?Physical  Activity: Not on file  ?Stress: Not on file  ?Social Connections: Not on file  ?Intimate Partner Violence: Not on file  ? ? ?Outpatient Medications Prior to Visit  ?Medication Sig Dispense Refill  ? calcium carbonate (OS-CAL) 600 MG tablet Take 1 tablet (600 mg total) by mouth 2 (two) times daily. 180 tablet 4  ? metoprolol succinate (TOPROL-XL) 50 MG 24 hr tablet TAKE 1 TABLET(50 MG) BY MOUTH DAILY WITH OR IMMEDIATELY FOLLOWING A MEAL 90 tablet 3  ? Multiple Vitamins-Minerals (CENTRUM SILVER 50+WOMEN PO) Centrum Silver Women    ? naproxen sodium (ALEVE) 220 MG tablet Take 220 mg by mouth.    ? pravastatin (PRAVACHOL) 80 MG tablet TAKE 1 TABLET(80 MG) BY MOUTH DAILY 90 tablet 3  ? chlorthalidone (HYGROTON) 25 MG tablet TAKE 1/2 TABLET BY MOUTH DAILY 100 tablet 1  ? ?No facility-administered medications prior to visit.  ? ? ?No Known Allergies ? ?ROS ?Review of Systems  ?Constitutional: Negative.   ?Respiratory: Negative.    ?Cardiovascular: Negative.   ?Gastrointestinal: Negative.   ?  Genitourinary: Negative.   ?Neurological:  Negative for dizziness and headaches.  ? ?  ?Objective:  ?  ?Physical Exam ?Vitals and nursing note reviewed.  ?Constitutional:   ?   General: She is not in acute distress. ?   Appearance: Normal appearance. She is not ill-appearing, toxic-appearing or diaphoretic.  ?HENT:  ?   Head: Normocephalic and atraumatic.  ?   Right Ear: External ear normal.  ?   Left Ear: External ear normal.  ?   Mouth/Throat:  ?   Mouth: Mucous membranes are moist.  ?   Pharynx: Oropharynx is clear. No oropharyngeal exudate or posterior oropharyngeal erythema.  ?Eyes:  ?   General: No scleral icterus.    ?   Right eye: No discharge.     ?   Left eye: No discharge.  ?   Conjunctiva/sclera: Conjunctivae normal.  ?   Pupils: Pupils are equal, round, and reactive to light.  ?Cardiovascular:  ?   Rate and Rhythm: Normal rate and regular rhythm.  ?Pulmonary:  ?   Effort: Pulmonary effort is normal.  ?   Breath sounds:  Normal breath sounds.  ?Abdominal:  ?   General: Bowel sounds are normal.  ?Skin: ?   General: Skin is warm and dry.  ?Neurological:  ?   Mental Status: She is alert and oriented to person, place, and time.  ?Psychiatric:     ?   Mood and Affect: Mood normal.     ?   Behavior: Behavior normal.  ? ? ?BP (!) 154/82 (BP Location: Right Arm, Patient Position: Sitting, Cuff Size: Normal)   Pulse 69   Temp (!) 97.3 ?F (36.3 ?C) (Temporal)   Ht 5\' 5"  (1.651 m)   Wt 183 lb 3.2 oz (83.1 kg)   SpO2 98%   BMI 30.49 kg/m?  ?Wt Readings from Last 3 Encounters:  ?09/18/21 183 lb 3.2 oz (83.1 kg)  ?06/17/21 187 lb 9.6 oz (85.1 kg)  ?06/02/21 188 lb 12.8 oz (85.6 kg)  ? ? ? ?There are no preventive care reminders to display for this patient. ? ?There are no preventive care reminders to display for this patient. ? ?Lab Results  ?Component Value Date  ? TSH 1.15 09/26/2020  ? ?Lab Results  ?Component Value Date  ? WBC 8.1 06/20/2021  ? HGB 14.1 06/20/2021  ? HCT 43.2 06/20/2021  ? MCV 94.4 06/20/2021  ? PLT 215.0 06/20/2021  ? ?Lab Results  ?Component Value Date  ? NA 138 06/20/2021  ? K 4.0 06/20/2021  ? CO2 28 06/20/2021  ? GLUCOSE 96 06/20/2021  ? BUN 30 (H) 06/20/2021  ? CREATININE 0.70 06/20/2021  ? BILITOT 1.1 09/26/2020  ? ALKPHOS 74 09/26/2020  ? AST 24 09/26/2020  ? ALT 22 09/26/2020  ? PROT 6.8 09/26/2020  ? ALBUMIN 4.5 09/26/2020  ? CALCIUM 9.6 06/20/2021  ? GFR 79.23 06/20/2021  ? ?Lab Results  ?Component Value Date  ? CHOL 240 (H) 06/20/2021  ? ?Lab Results  ?Component Value Date  ? HDL 70.60 06/20/2021  ? ?Lab Results  ?Component Value Date  ? [place] 140 (H) 06/20/2021  ? ?Lab Results  ?Component Value Date  ? TRIG 149.0 06/20/2021  ? ?Lab Results  ?Component Value Date  ? CHOLHDL 3 06/20/2021  ? ?No results found for: HGBA1C ? ?  ?Assessment & Plan:  ? ?Problem List Items Addressed This Visit   ? ?  ? Cardiovascular and Mediastinum  ? Essential hypertension - Primary  ?  Relevant Medications  ? chlorthalidone  (HYGROTON) 25 MG tablet  ? Other Relevant Orders  ? Basic metabolic panel  ?  ? Other  ? Elevated cholesterol  ? Relevant Medications  ? chlorthalidone (HYGROTON) 25 MG tablet  ? Other Relevant Orders  ? Lipid panel  ? Worries  ? ? ?Meds ordered this encounter  ?Medications  ? chlorthalidone (HYGROTON) 25 MG tablet  ?  Sig: Take 1 tablet (25 mg total) by mouth daily.  ?  Dispense:  90 tablet  ?  Refill:  1  ? ? ?Follow-up: Return in about 3 months (around 12/19/2021).  ?Rechecking lipid profile again.  If LDL remains elevated, will consider changing statins.  Have increased chlorthalidone to 25 mg daily continue metoprolol.  ? ?Libby Maw, MD ?

## 2021-09-22 MED ORDER — ROSUVASTATIN CALCIUM 20 MG PO TABS: 20.0000 mg | ORAL_TABLET | Freq: Every day | ORAL | 3 refills | Status: DC

## 2021-09-22 NOTE — Addendum Note (Signed)
Addended by: Andrez Grime on: 09/22/2021 07:53 AM ? ? Modules accepted: Orders ? ?

## 2021-09-23 ENCOUNTER — Telehealth: Payer: Self-pay | Admitting: Family Medicine

## 2021-09-23 DIAGNOSIS — E7800 Pure hypercholesterolemia, unspecified: Secondary | ICD-10-CM

## 2021-09-23 MED ORDER — ROSUVASTATIN CALCIUM 20 MG PO TABS: 20.0000 mg | ORAL_TABLET | Freq: Every day | ORAL | 3 refills | Status: DC

## 2021-09-23 NOTE — Telephone Encounter (Signed)
The medication that was called in yesterday went to the wrong pharmacy. Pt requesting it go to the [facility] on [address]  ?(Rosuvastatin) ?

## 2021-09-23 NOTE — Telephone Encounter (Signed)
[facility] and sent to [facility]. Patient aware.  ?

## 2021-10-27 ENCOUNTER — Telehealth: Payer: Self-pay | Admitting: Neurology

## 2021-10-27 NOTE — Telephone Encounter (Signed)
Rescheduled 6/12 appt with pt over the phone- Dr. Rexene Alberts out. ?

## 2021-12-01 ENCOUNTER — Ambulatory Visit: Payer: Medicare Other | Admitting: Neurology

## 2021-12-04 ENCOUNTER — Ambulatory Visit (INDEPENDENT_AMBULATORY_CARE_PROVIDER_SITE_OTHER): Payer: Medicare Other | Admitting: Neurology

## 2021-12-04 ENCOUNTER — Encounter: Payer: Self-pay | Admitting: Neurology

## 2021-12-04 VITALS — BP 149/79 | HR 75 | Ht 65.0 in | Wt 170.0 lb

## 2021-12-04 DIAGNOSIS — R251 Tremor, unspecified: Secondary | ICD-10-CM | POA: Diagnosis not present

## 2021-12-04 NOTE — Patient Instructions (Signed)
It was nice to see you both again today.  Your tremor is stable.  Please continue to be mindful of your tremor triggers including stress and dehydration.  Please limit your alcohol to up to 1 glass/day.  Avoid caffeine if possible.  Please follow-up with your primary care as scheduled/planned.

## 2021-12-04 NOTE — Progress Notes (Signed)
Subjective:    Patient ID: Kerry Coleman is a 87 y.o. female.  HPI    Interim history:  Ms. Kerry Coleman is an 87 year old right-handed woman with an underlying medical history of arthritis, status post bilateral knee replacements, status post left shoulder rotator cuff surgery, significant arthritis affecting her hands and right more than left feet, status post carpal tunnel surgery, status post back surgery twice, and mild obesity, who presents for follow-up consultation of her hand tremor.  The patient is accompanied by her husband today. I last saw her on 06/02/2021, at which time she reported feeling stable.  She was encouraged to hydrate better with water.  Initially going to hold off on the symptomatic medication files.  Her DaTscan from August 2022 was not supportive of an underlying parkinsonian syndrome.  We had talked about caffeine limitation and limiting alcohol before.  She was trying to do these lifestyle changes.  Today, 12/04/21: She reports feeling stable for the most part, in fact, her left hand tremor seems to be better.  She tries to hydrate well with water, drinks about 3 large tumblers of water per day, has switched to decaf coffee and drinks up to 2 to 3 cups in the mornings, typically not after lunch.  She drinks alcohol in the form of wine, typically 1 or 2 glasses each evening.  She tries to stay active, likes to swim in the summer in the neighborhood pool and walks almost daily with her husband.  She tries to keep busy, stress is a trigger for her tremor.  They have well, and there is second great granddaughter recently.  She is doing well overall.  She does feel that arthritis has become worse.  She has arthritis in all major joints and also hand and feet joints.  She had of steroid injection recently.   The patient's allergies, current medications, family history, past medical history, past social history, past surgical history and problem list were reviewed and updated as  appropriate.    Previously:    I first met her at the request of her primary care physician on 12/10/2020, at which time she reported an approximately 3-year history of hand tremors, noticed that primarily in the right hand first.  She did not have any telltale signs of parkinsonism but did have some subtle signs including a resting tremor on examination.  She did not have a family history of tremors, in particular, her identical twin sister did not have a tremor.  She was advised to proceed with a DaTscan.  She was advised to consider limiting her alcohol consumption to less than 1/day.  She was advised to stay well-hydrated with water.   She had a brain DaT scan on 01/28/21, and I reviewed the results: IMPRESSION: No reduced radiotracer activity in basal ganglia to suggest Parkinson's syndrome pathology. Study favored within normal limits.   Of note, DaTSCAN is not diagnostic of Parkinsonian syndromes, which remains a clinical diagnosis. DaTscan is an adjuvant test to aid in the clinical diagnosis of Parkinsonian syndromes.        12/10/20: (She) reports a approximately 3-year history of tremors affecting primarily her right hand.  It started about 3 years ago, she was still living in [place] at the time.  They moved about 2 years ago.  She had previously seen her primary care physician in [place] for the tremor and was reassured.  She has noticed progression of her tremor and has noticed in the past few months that it is  affecting her left hand as well.  She is not impaired in her day-to-day functioning but is worried about this tremor.  She has a twin sister who does not have a tremor, no other family history of tremors.  No family history of Parkinson's disease.  She has not been sleeping very well.  She has nocturia.  She has some sleepiness during the day.  She has difficulty staying asleep.  She has never had a sleep study.  Her older sister passed away at the age of 77 from breast cancer.   She has 3 grown sons, 1 lives in [place], the other in [place] and 1 lives in [place].  She has noticed that stress makes it worse.  She has had some stress, she takes care of her husband.  She has not been drinking caffeine on a day-to-day basis, decaf coffee typically.  She does drink up to 2 glasses of wine per day.  She had recent blood work through your office on 09/26/2020 and I was able to review the results.  CMP was unremarkable, BUN 19, creatinine 0.67.  Lipid panel showed cholesterol of 256, LDL elevated at 154, urinalysis unremarkable.  Vitamin D 38.17, TSH normal at 1.15, CBC without differential normal.  She is a non-smoker.  She has not noticed much in the way of fine motor dyscontrol, she has not had any recent falls.    Her Past Medical History Is Significant For: History reviewed. No pertinent past medical history.  Her Past Surgical History Is Significant For: Past Surgical History:  Procedure Laterality Date   BACK SURGERY     BREAST BIOPSY Left    @ age 29, Benign    BUNIONECTOMY     KNEE SURGERY     right carpal tunnel release     right index finger excision of mass     ROTATOR CUFF REPAIR     SPINE SURGERY     TONSILLECTOMY      Her Family History Is Significant For: Family History  Problem Relation Age of Onset   Heart disease Mother    Stroke Mother    Arthritis Father    Heart disease Father    Hyperlipidemia Sister    Hypertension Sister    Arthritis Sister    Cancer Sister    Breast cancer Sister 62   Hypertension Son    Hyperlipidemia Son    Breast cancer Cousin    Tremor Neg Hx     Her Social History Is Significant For: Social History   Socioeconomic History   Marital status: Married    Spouse name: Not on file   Number of children: Not on file   Years of education: Not on file   Highest education level: Not on file  Occupational History   Not on file  Tobacco Use   Smoking status: Never   Smokeless tobacco: Never   Substance and Sexual Activity   Alcohol use: Yes    Alcohol/week: 14.0 standard drinks of alcohol    Types: 14 Glasses of wine per week    Comment: 2 glass of red wine daily.   Drug use: Never   Sexual activity: Not on file  Other Topics Concern   Not on file  Social History Narrative   Not on file   Social Determinants of Health   Financial Resource Strain: Low Risk  (10/25/2019)   Overall Financial Resource Strain (CARDIA)    Difficulty of Paying Living Expenses: Not hard  at all  Food Insecurity: No Food Insecurity (10/25/2019)   Hunger Vital Sign    Worried About Running Out of Food in the Last Year: Never true    Ran Out of Food in the Last Year: Never true  Transportation Needs: No Transportation Needs (10/25/2019)   PRAPARE - Hydrologist (Medical): No    Lack of Transportation (Non-Medical): No  Physical Activity: Not on file  Stress: Not on file  Social Connections: Not on file    Her Allergies Are:  No Known Allergies:   Her Current Medications Are:  Outpatient Encounter Medications as of 12/04/2021  Medication Sig   calcium carbonate (OS-CAL) 600 MG tablet Take 1 tablet (600 mg total) by mouth 2 (two) times daily.   chlorthalidone (HYGROTON) 25 MG tablet Take 1 tablet (25 mg total) by mouth daily.   Cholecalciferol (VITAMIN D3 PO) Take 50 mg by mouth daily.   metoprolol succinate (TOPROL-XL) 50 MG 24 hr tablet TAKE 1 TABLET(50 MG) BY MOUTH DAILY WITH OR IMMEDIATELY FOLLOWING A MEAL   Multiple Vitamins-Minerals (CENTRUM SILVER 50+WOMEN PO) Centrum Silver Women   naproxen sodium (ALEVE) 220 MG tablet Take 220 mg by mouth as needed.   rosuvastatin (CRESTOR) 20 MG tablet Take 1 tablet (20 mg total) by mouth daily.   No facility-administered encounter medications on file as of 12/04/2021.  :  Review of Systems:  Out of a complete 14 point review of systems, all are reviewed and negative with the exception of these symptoms as listed  below:  Review of Systems  Neurological:        Pt is here for tremors follow up Pt states tremors are in both hands . Pt states the right hand is worse than left   Pt states tremors have improved since last visit     Objective:  Neurological Exam  Physical Exam Physical Examination:   Vitals:   12/04/21 1136  BP: (!) 149/79  Pulse: 75    General Examination: The patient is a very pleasant 87 y.o. female in no acute distress. She appears well-developed and well-nourished and well groomed.   HEENT: Normocephalic, atraumatic, pupils are equal, round and reactive to light, extraocular tracking is well-preserved, hearing is grossly intact, she has hearing aids.  She has corrective eyeglasses in place.  Face is symmetric, no significant facial masking noted, no significant nuchal rigidity noted.  She has no lip, neck or jaw tremor, no significant hypophonia, no voice tremor.  No carotid bruits.  Airway examination reveals a small airway entry, tonsils are absent.  Tongue protrudes centrally and palate elevates symmetrically, tongue movements are good.     Chest: Clear to auscultation without wheezing, rhonchi or crackles noted.   Heart: S1+S2+0, regular and normal without murmurs, rubs or gallops noted.    Abdomen: Soft, non-tender and non-distended with normal bowel sounds appreciated on auscultation.   Extremities: There is non-pitting puffiness in the distal lower extremities bilaterally. Pedal pulses are intact.   Skin: Warm and dry without trophic changes noted.   Musculoskeletal: exam reveals decreased range of motion in both knees, status post bilateral knee replacement surgeries.  She has prominent arthritic changes of both hands.    Neurologically:  Mental status: The patient is awake, alert and oriented in all 4 spheres. Her immediate and remote memory, attention, language skills and fund of knowledge are appropriate. There is no evidence of aphasia, agnosia, apraxia or  anomia. Speech is clear with normal  prosody and enunciation. Thought process is linear. Mood is normal and affect is normal.  Cranial nerves II - XII are as described above under HEENT exam.    Motor exam: Normal bulk, strength and tone is noted. Romberg is not tested for safety concerns.  On fine motor skills testing she does have difficulty with finger taps and hand movements, right side a little worse than left, likely due to arthritis in both hands.  She has a mild and intermittent resting tremor in the right more than left upper extremities, stable. She has a very minimal postural tremor in both upper extremities, no significant action tremor, no intention tremor.     (On 12/10/2020: On Archimedes spiral drawing she has no significant trembling with the right or left hand, mild insecurity with her nondominant/left hand.  Handwriting is slightly tremulous, not particularly micrographic and well legible.)   Cerebellar testing: No dysmetria or intention tremor. Sensory exam: intact to light touch in the upper and lower extremities.  Gait, station and balance: She stands with without major difficulty, posture is age-appropriate, walks with fairly good stride length and pace, no significant change in arm swing bilaterally.    Assessment and Plan:    In summary, Clarinda Obi is a very pleasant 87 year old female with an underlying medical history of arthritis, status post bilateral knee replacements, status post left shoulder rotator cuff surgery, significant arthritis affecting her hands and right more than left feet, status post carpal tunnel surgery, status post back surgery twice, and mild obesity, who presents for follow-up consultation of her tremors of approximately 4+ years duration.  She has had more tremor on the right, lately she feels that her tremor has been stable and perhaps even better, sometimes not noticeable at all on the left side.  She does not have a strong family history of  tremors.  DaTscan from 01/28/2021 was not supportive of an underlying parkinsonian syndrome.  She may have a mild form of essential tremor.  We talked about tremor triggers again today.  She is advised to stay well-hydrated, limit her caffeine, she has switched to decaf.  She does drink 2 glasses of wine per day on average.  She is encouraged to limit herself to 1 glass/day, 6 ounce size.  She is advised to follow-up with her primary care as scheduled/planned, she will follow-up in this clinic as needed at this juncture.  I answered all the questions today and the patient and her husband were in agreement.

## 2021-12-08 ENCOUNTER — Other Ambulatory Visit: Payer: Self-pay | Admitting: Family Medicine

## 2021-12-08 DIAGNOSIS — I10 Essential (primary) hypertension: Secondary | ICD-10-CM

## 2021-12-19 ENCOUNTER — Encounter: Payer: Self-pay | Admitting: Family Medicine

## 2021-12-19 ENCOUNTER — Ambulatory Visit (INDEPENDENT_AMBULATORY_CARE_PROVIDER_SITE_OTHER): Payer: Medicare Other | Admitting: Family Medicine

## 2021-12-19 VITALS — BP 134/72 | HR 77 | Temp 96.8°F | Ht 65.0 in | Wt 180.4 lb

## 2021-12-19 DIAGNOSIS — E7800 Pure hypercholesterolemia, unspecified: Secondary | ICD-10-CM | POA: Diagnosis not present

## 2021-12-19 DIAGNOSIS — I10 Essential (primary) hypertension: Secondary | ICD-10-CM | POA: Diagnosis not present

## 2021-12-19 DIAGNOSIS — R4582 Worries: Secondary | ICD-10-CM

## 2021-12-19 LAB — BASIC METABOLIC PANEL
BUN: 17 mg/dL (ref 6–23)
CO2: 30 mEq/L (ref 19–32)
Calcium: 10.5 mg/dL (ref 8.4–10.5)
Chloride: 101 mEq/L (ref 96–112)
Creatinine, Ser: 0.71 mg/dL (ref 0.40–1.20)
GFR: 77.62 mL/min (ref >60.00)
Glucose, Bld: 103 mg/dL — ABNORMAL HIGH (ref 70–99)
Potassium: 4.2 mEq/L (ref 3.5–5.1)
Sodium: 140 mEq/L (ref 135–145)

## 2021-12-19 LAB — LIPID PANEL
Cholesterol: 198 mg/dL (ref 0–200)
HDL: 80.9 mg/dL (ref >39.00)
LDL Cholesterol: 101 mg/dL — ABNORMAL HIGH (ref 0–99)
NonHDL: 117.51
Total CHOL/HDL Ratio: 2
Triglycerides: 81 mg/dL (ref 0.0–149.0)
VLDL: 16.2 mg/dL (ref 0.0–40.0)

## 2021-12-19 NOTE — Progress Notes (Signed)
Established Patient Office Visit  Subjective   Patient ID: Kerry Coleman, female    DOB: [birth year]  Age: 87 y.o. MRN: [id number]  Chief Complaint  Patient presents with   Follow-up    3 month follow, no concerns. Patient fasting.     HPI follow-up of hypertension and elevated cholesterol.  At home blood pressure has improved.  It is running in the 120s to 130s over 60s to 70s.  Rosuvastatin.  Continues to exercise and consume a low-fat low-cholesterol diet.  Her twin sister has been able to lose 15 pounds and she would like to catch up with her.  Seeing Dr. Horald Chestnut for back issues and injections do not seem to be helping much.  Things are better when she moves around.  Continues to exercise by walking.    Review of Systems  Constitutional:  Negative for chills, diaphoresis, malaise/fatigue and weight loss.  HENT: Negative.    Eyes: Negative.  Negative for blurred vision and double vision.  Cardiovascular:  Negative for chest pain.  Gastrointestinal:  Negative for abdominal pain.  Genitourinary: Negative.   Musculoskeletal:  Positive for back pain. Negative for falls and myalgias.  Neurological:  Negative for speech change, loss of consciousness and weakness.  Psychiatric/Behavioral: Negative.           12/19/2021   10:01 AM 09/18/2021   11:02 AM 06/17/2021    2:53 PM  Depression screen PHQ 2/9  Decreased Interest 0 0 0  Down, Depressed, Hopeless 0 0   PHQ - 2 Score 0 0 0     Objective:     BP 134/72 (BP Location: Right Arm, Patient Position: Sitting, Cuff Size: Large)   Pulse 77   Temp (!) 96.8 F (36 C) (Temporal)   Ht 5\' 5"  (1.651 m)   Wt 180 lb 6.4 oz (81.8 kg)   SpO2 97%   BMI 30.02 kg/m    Physical Exam Constitutional:      General: She is not in acute distress.    Appearance: Normal appearance. She is not ill-appearing, toxic-appearing or diaphoretic.  HENT:     Head: Normocephalic and atraumatic.     Right Ear: External ear normal.     Left Ear:  External ear normal.     Mouth/Throat:     Mouth: Mucous membranes are moist.     Pharynx: Oropharynx is clear. No oropharyngeal exudate or posterior oropharyngeal erythema.  Eyes:     General: No scleral icterus.       Right eye: No discharge.        Left eye: No discharge.     Extraocular Movements: Extraocular movements intact.     Conjunctiva/sclera: Conjunctivae normal.     Pupils: Pupils are equal, round, and reactive to light.  Cardiovascular:     Rate and Rhythm: Normal rate and regular rhythm.  Pulmonary:     Effort: Pulmonary effort is normal. No respiratory distress.     Breath sounds: Normal breath sounds.  Musculoskeletal:     Cervical back: No rigidity or tenderness.  Skin:    General: Skin is warm and dry.  Neurological:     Mental Status: She is alert and oriented to person, place, and time.  Psychiatric:        Mood and Affect: Mood normal.        Behavior: Behavior normal.      No results found for any visits on 12/19/21.    The ASCVD Risk score (Arnett DK,  et al., 2019) failed to calculate for the following reasons:   The 2019 ASCVD risk score is only valid for ages 11 to 42    Assessment & Plan:   Problem List Items Addressed This Visit       Cardiovascular and Mediastinum   Essential hypertension   Relevant Orders   Basic metabolic panel     Other   Elevated cholesterol - Primary   Relevant Orders   Lipid panel   Worries    Return in about 6 months (around 06/20/2022).  BP improved with higher dose of chlorthalidone and awaiting results lipid profile status post change to rosuvastatin.  Continue exercise and low-fat low-cholesterol diet.  Encouraged patient to continue her weight loss journey.  Information was given on mindfulness based stress reduction.  Mliss Sax, MD

## 2022-03-12 ENCOUNTER — Ambulatory Visit (INDEPENDENT_AMBULATORY_CARE_PROVIDER_SITE_OTHER): Payer: Medicare Other

## 2022-03-12 DIAGNOSIS — Z23 Encounter for immunization: Secondary | ICD-10-CM | POA: Diagnosis not present

## 2022-03-12 NOTE — Progress Notes (Signed)
After obtaining consent, and per orders of Dr. Kremer, injection of influenza given by Ravis Herne. Patient instructed to remain in clinic for 20 minutes afterwards, and to report any adverse reaction to me immediately.  

## 2022-03-25 ENCOUNTER — Other Ambulatory Visit: Payer: Self-pay | Admitting: Family Medicine

## 2022-03-25 DIAGNOSIS — I10 Essential (primary) hypertension: Secondary | ICD-10-CM

## 2022-04-06 ENCOUNTER — Encounter: Payer: Self-pay | Admitting: Family Medicine

## 2022-06-19 ENCOUNTER — Ambulatory Visit: Payer: Medicare Other | Admitting: Family Medicine

## 2022-06-24 ENCOUNTER — Ambulatory Visit (INDEPENDENT_AMBULATORY_CARE_PROVIDER_SITE_OTHER): Payer: Medicare Other | Admitting: Family Medicine

## 2022-06-24 ENCOUNTER — Encounter: Payer: Self-pay | Admitting: Family Medicine

## 2022-06-24 VITALS — BP 134/76 | HR 86 | Temp 97.2°F | Ht 65.0 in | Wt 181.0 lb

## 2022-06-24 DIAGNOSIS — M545 Low back pain, unspecified: Secondary | ICD-10-CM

## 2022-06-24 DIAGNOSIS — G8929 Other chronic pain: Secondary | ICD-10-CM | POA: Diagnosis not present

## 2022-06-24 DIAGNOSIS — I10 Essential (primary) hypertension: Secondary | ICD-10-CM | POA: Diagnosis not present

## 2022-06-24 DIAGNOSIS — E7800 Pure hypercholesterolemia, unspecified: Secondary | ICD-10-CM

## 2022-06-24 NOTE — Progress Notes (Signed)
   Established Patient Office Visit   Subjective:  Patient ID: Kerry Coleman, female    DOB: [birth year]  Age: 88 y.o. MRN: [id number]  Chief Complaint  Patient presents with   Follow-up    6 month follow up concerns about continued back pains.     HPI Encounter Diagnoses  Name Primary?   Elevated cholesterol Yes   Essential hypertension    Chronic left-sided low back pain, unspecified whether sciatica present    For follow-up of elevated cholesterol and hypertension.  Blood pressure well-controlled with chlorthalidone and metoprolol.  Continues rosuvastatin for elevated LDL cholesterol.  She has a quite favorable HDL level but her 10-year risk score is elevated due to age.  Ongoing back pain.  Has been referred for neurosurgery consultation by her orthopedic surgeon.  Distant history of L3-L5 interbody fusion with rods and pedicle screws.  Orthopedist believes that she is having pain 5 S1.  Radiographs reviewed today show diminished disc space.    ROS   Current Outpatient Medications:    calcium carbonate (OS-CAL) 600 MG tablet, Take 1 tablet (600 mg total) by mouth 2 (two) times daily., Disp: 180 tablet, Rfl: 4   chlorthalidone (HYGROTON) 25 MG tablet, TAKE 1 TABLET(25 MG) BY MOUTH DAILY, Disp: 90 tablet, Rfl: 1   Cholecalciferol (VITAMIN D3 PO), Take 50 mg by mouth daily., Disp: , Rfl:    metoprolol succinate (TOPROL-XL) 50 MG 24 hr tablet, TAKE 1 TABLET(50 MG) BY MOUTH DAILY WITH OR IMMEDIATELY FOLLOWING A MEAL, Disp: 90 tablet, Rfl: 3   Multiple Vitamins-Minerals (CENTRUM SILVER 50+WOMEN PO), Centrum Silver Women, Disp: , Rfl:    naproxen sodium (ALEVE) 220 MG tablet, Take 220 mg by mouth as needed., Disp: , Rfl:    rosuvastatin (CRESTOR) 20 MG tablet, Take 1 tablet (20 mg total) by mouth daily., Disp: 90 tablet, Rfl: 3   Objective:     BP 134/76 (BP Location: Right Arm, Patient Position: Sitting, Cuff Size: Normal)   Pulse 86   Temp (!) 97.2 F (36.2 C) (Temporal)   Ht 5'  5" (1.651 m)   Wt 181 lb (82.1 kg)   SpO2 95%   BMI 30.12 kg/m    Physical Exam   No results found for any visits on 06/24/22.    The ASCVD Risk score (Arnett DK, et al., 2019) failed to calculate for the following reasons:   The 2019 ASCVD risk score is only valid for ages 47 to 56    Assessment & Plan:   Elevated cholesterol -     Lipid panel; Future  Essential hypertension -     Basic metabolic panel; Future  Chronic left-sided low back pain, unspecified whether sciatica present    Return in about 6 months (around 12/23/2022), or Return fasting for above.  Labs.  Will follow-up with neurosurgery as planned.  She is avoiding contact therapy.  His medications do not tend to agree with her.  She will try lidocaine patches as needed.  Libby Maw, MD

## 2022-06-25 ENCOUNTER — Other Ambulatory Visit (INDEPENDENT_AMBULATORY_CARE_PROVIDER_SITE_OTHER): Payer: Medicare Other

## 2022-06-25 DIAGNOSIS — I10 Essential (primary) hypertension: Secondary | ICD-10-CM | POA: Diagnosis not present

## 2022-06-25 DIAGNOSIS — E7800 Pure hypercholesterolemia, unspecified: Secondary | ICD-10-CM | POA: Diagnosis not present

## 2022-06-25 LAB — BASIC METABOLIC PANEL
BUN: 22 mg/dL (ref 6–23)
CO2: 29 mEq/L (ref 19–32)
Calcium: 9.5 mg/dL (ref 8.4–10.5)
Chloride: 100 mEq/L (ref 96–112)
Creatinine, Ser: 0.6 mg/dL (ref 0.40–1.20)
GFR: 81.65 mL/min (ref >60.00)
Glucose, Bld: 101 mg/dL — ABNORMAL HIGH (ref 70–99)
Potassium: 4.2 mEq/L (ref 3.5–5.1)
Sodium: 138 mEq/L (ref 135–145)

## 2022-06-25 LAB — LIPID PANEL
Cholesterol: 238 mg/dL — ABNORMAL HIGH (ref 0–200)
HDL: 78 mg/dL (ref >39.00)
LDL Cholesterol: 122 mg/dL — ABNORMAL HIGH (ref 0–99)
NonHDL: 159.92
Total CHOL/HDL Ratio: 3
Triglycerides: 189 mg/dL — ABNORMAL HIGH (ref 0.0–149.0)
VLDL: 37.8 mg/dL (ref 0.0–40.0)

## 2022-07-23 ENCOUNTER — Telehealth: Payer: Self-pay | Admitting: Family Medicine

## 2022-07-23 NOTE — Telephone Encounter (Signed)
Left message for patient to call back and schedule Medicare Annual Wellness Visit (AWV) either virtually or in office. Left  my Herbie Drape number [phone]   Last AWV 10/25/19 please schedule with Nurse Health Adviser   45 min for awv-i  in office appointments 30 min for awv-s & awv-i phone/virtual appointments

## 2022-08-10 ENCOUNTER — Ambulatory Visit (INDEPENDENT_AMBULATORY_CARE_PROVIDER_SITE_OTHER): Payer: Medicare Other

## 2022-08-10 VITALS — Ht 65.0 in | Wt 178.0 lb

## 2022-08-10 DIAGNOSIS — Z0000 Encounter for general adult medical examination without abnormal findings: Secondary | ICD-10-CM

## 2022-08-10 NOTE — Patient Instructions (Signed)
Kerry Coleman , Thank you for taking time to come for your Medicare Wellness Visit. I appreciate your ongoing commitment to your health goals. Please review the following plan we discussed and let me know if I can assist you in the future.   These are the goals we discussed:  Goals      Continue healthy diet and exercise     Patient Stated     08/10/2022, wants to get through surgery        This is a list of the screening recommended for you and due dates:  Health Maintenance  Topic Date Due   Medicare Annual Wellness Visit  08/11/2023   DTaP/Tdap/Td vaccine (2 - Td or Tdap) 05/11/2024   Pneumonia Vaccine  Completed   Flu Shot  Completed   DEXA scan (bone density measurement)  Completed   Zoster (Shingles) Vaccine  Completed   HPV Vaccine  Aged Out   COVID-19 Vaccine  Discontinued    Advanced directives: copy in chart  Conditions/risks identified: none  Next appointment: Follow up in one year for your annual wellness visit    Preventive Care 65 Years and Older, Female Preventive care refers to lifestyle choices and visits with your health care provider that can promote health and wellness. What does preventive care include? A yearly physical exam. This is also called an annual well check. Dental exams once or twice a year. Routine eye exams. Ask your health care provider how often you should have your eyes checked. Personal lifestyle choices, including: Daily care of your teeth and gums. Regular physical activity. Eating a healthy diet. Avoiding tobacco and drug use. Limiting alcohol use. Practicing safe sex. Taking low-dose aspirin every day. Taking vitamin and mineral supplements as recommended by your health care provider. What happens during an annual well check? The services and screenings done by your health care provider during your annual well check will depend on your age, overall health, lifestyle risk factors, and family history of disease. Counseling  Your  health care provider may ask you questions about your: Alcohol use. Tobacco use. Drug use. Emotional well-being. Home and relationship well-being. Sexual activity. Eating habits. History of falls. Memory and ability to understand (cognition). Work and work Statistician. Reproductive health. Screening  You may have the following tests or measurements: Height, weight, and BMI. Blood pressure. Lipid and cholesterol levels. These may be checked every 5 years, or more frequently if you are over 4 years old. Skin check. Lung cancer screening. You may have this screening every year starting at age 3 if you have a 30-pack-year history of smoking and currently smoke or have quit within the past 15 years. Fecal occult blood test (FOBT) of the stool. You may have this test every year starting at age 21. Flexible sigmoidoscopy or colonoscopy. You may have a sigmoidoscopy every 5 years or a colonoscopy every 10 years starting at age 12. Hepatitis C blood test. Hepatitis B blood test. Sexually transmitted disease (STD) testing. Diabetes screening. This is done by checking your blood sugar (glucose) after you have not eaten for a while (fasting). You may have this done every 1-3 years. Bone density scan. This is done to screen for osteoporosis. You may have this done starting at age 31. Mammogram. This may be done every 1-2 years. Talk to your health care provider about how often you should have regular mammograms. Talk with your health care provider about your test results, treatment options, and if necessary, the need for more  tests. Vaccines  Your health care provider may recommend certain vaccines, such as: Influenza vaccine. This is recommended every year. Tetanus, diphtheria, and acellular pertussis (Tdap, Td) vaccine. You may need a Td booster every 10 years. Zoster vaccine. You may need this after age 36. Pneumococcal 13-valent conjugate (PCV13) vaccine. One dose is recommended after age  73. Pneumococcal polysaccharide (PPSV23) vaccine. One dose is recommended after age 90. Talk to your health care provider about which screenings and vaccines you need and how often you need them. This information is not intended to replace advice given to you by your health care provider. Make sure you discuss any questions you have with your health care provider. Document Released: 07/05/2015 Document Revised: 02/26/2016 Document Reviewed: 04/09/2015 Elsevier Interactive Patient Education  2017 [place] Prevention in the Home Falls can cause injuries. They can happen to people of all ages. There are many things you can do to make your home safe and to help prevent falls. What can I do on the outside of my home? Regularly fix the edges of walkways and driveways and fix any cracks. Remove anything that might make you trip as you walk through a door, such as a raised step or threshold. Trim any bushes or trees on the path to your home. Use bright outdoor lighting. Clear any walking paths of anything that might make someone trip, such as rocks or tools. Regularly check to see if handrails are loose or broken. Make sure that both sides of any steps have handrails. Any raised decks and porches should have guardrails on the edges. Have any leaves, snow, or ice cleared regularly. Use sand or salt on walking paths during winter. Clean up any spills in your garage right away. This includes oil or grease spills. What can I do in the bathroom? Use night lights. Install grab bars by the toilet and in the tub and shower. Do not use towel bars as grab bars. Use non-skid mats or decals in the tub or shower. If you need to sit down in the shower, use a plastic, non-slip stool. Keep the floor dry. Clean up any water that spills on the floor as soon as it happens. Remove soap buildup in the tub or shower regularly. Attach bath mats securely with double-sided non-slip rug tape. Do not have throw  rugs and other things on the floor that can make you trip. What can I do in the bedroom? Use night lights. Make sure that you have a light by your bed that is easy to reach. Do not use any sheets or blankets that are too big for your bed. They should not hang down onto the floor. Have a firm chair that has side arms. You can use this for support while you get dressed. Do not have throw rugs and other things on the floor that can make you trip. What can I do in the kitchen? Clean up any spills right away. Avoid walking on wet floors. Keep items that you use a lot in easy-to-reach places. If you need to reach something above you, use a strong step stool that has a grab bar. Keep electrical cords out of the way. Do not use floor polish or wax that makes floors slippery. If you must use wax, use non-skid floor wax. Do not have throw rugs and other things on the floor that can make you trip. What can I do with my stairs? Do not leave any items on the stairs. Make sure  that there are handrails on both sides of the stairs and use them. Fix handrails that are broken or loose. Make sure that handrails are as long as the stairways. Check any carpeting to make sure that it is firmly attached to the stairs. Fix any carpet that is loose or worn. Avoid having throw rugs at the top or bottom of the stairs. If you do have throw rugs, attach them to the floor with carpet tape. Make sure that you have a light switch at the top of the stairs and the bottom of the stairs. If you do not have them, ask someone to add them for you. What else can I do to help prevent falls? Wear shoes that: Do not have high heels. Have rubber bottoms. Are comfortable and fit you well. Are closed at the toe. Do not wear sandals. If you use a stepladder: Make sure that it is fully opened. Do not climb a closed stepladder. Make sure that both sides of the stepladder are locked into place. Ask someone to hold it for you, if  possible. Clearly mark and make sure that you can see: Any grab bars or handrails. First and last steps. Where the edge of each step is. Use tools that help you move around (mobility aids) if they are needed. These include: Canes. Walkers. Scooters. Crutches. Turn on the lights when you go into a dark area. Replace any light bulbs as soon as they burn out. Set up your furniture so you have a clear path. Avoid moving your furniture around. If any of your floors are uneven, fix them. If there are any pets around you, be aware of where they are. Review your medicines with your doctor. Some medicines can make you feel dizzy. This can increase your chance of falling. Ask your doctor what other things that you can do to help prevent falls. This information is not intended to replace advice given to you by your health care provider. Make sure you discuss any questions you have with your health care provider. Document Released: 04/04/2009 Document Revised: 11/14/2015 Document Reviewed: 07/13/2014 Elsevier Interactive Patient Education  2017 [employer].

## 2022-08-10 NOTE — Progress Notes (Signed)
I connected with  Kerry Coleman on 08/10/22 by a audio enabled telemedicine application and verified that I am speaking with the correct person using two identifiers.  Patient Location: Home  Provider Location: Office/Clinic  I discussed the limitations of evaluation and management by telemedicine. The patient expressed understanding and agreed to proceed.  Subjective:   Kerry Coleman is a 88 y.o. female who presents for Medicare Annual (Subsequent) preventive examination.  Review of Systems     Cardiac Risk Factors include: advanced age (>72mn, >>70women);hypertension     Objective:    Today's Vitals   08/10/22 0917  Weight: 178 lb (80.7 kg)  Height: 5' 5"$  (1.651 m)  PainSc: 5    Body mass index is 29.62 kg/m.     08/10/2022    9:21 AM 10/25/2019    8:12 AM  Advanced Directives  Does Patient Have a Medical Advance Directive? Yes Yes  Type of AParamed[place]of [place] will [place] will  Does patient want to make changes to medical advance directive?  No - Patient declined  Copy of [place] Chart? Yes - validated most recent copy scanned in chart (See row information) No - copy requested    Current Medications (verified) Outpatient Encounter Medications as of 08/10/2022  Medication Sig   calcium carbonate (OS-CAL) 600 MG tablet Take 1 tablet (600 mg total) by mouth 2 (two) times daily.   chlorthalidone (HYGROTON) 25 MG tablet TAKE 1 TABLET(25 MG) BY MOUTH DAILY   Cholecalciferol (VITAMIN D3 PO) Take 50 mg by mouth daily.   metoprolol succinate (TOPROL-XL) 50 MG 24 hr tablet TAKE 1 TABLET(50 MG) BY MOUTH DAILY WITH OR IMMEDIATELY FOLLOWING A MEAL   Multiple Vitamins-Minerals (CENTRUM SILVER 50+WOMEN PO) Centrum Silver Women   naproxen sodium (ALEVE) 220 MG tablet Take 220 mg by mouth as needed.   rosuvastatin (CRESTOR) 20 MG tablet Take 1 tablet (20 mg total) by mouth daily.   No  facility-administered encounter medications on file as of 08/10/2022.    Allergies (verified) Patient has no known allergies.   History: History reviewed. No pertinent past medical history. Past Surgical History:  Procedure Laterality Date   BACK SURGERY     BREAST BIOPSY Left    @ age 89 Benign    BUNIONECTOMY     KNEE SURGERY     right carpal tunnel release     right index finger excision of mass     ROTATOR CUFF REPAIR     SPINE SURGERY     TONSILLECTOMY     Family History  Problem Relation Age of Onset   Heart disease Mother    Stroke Mother    Arthritis Father    Heart disease Father    Hyperlipidemia Sister    Hypertension Sister    Arthritis Sister    Cancer Sister    Breast cancer Sister 447  Hypertension Son    Hyperlipidemia Son    Breast cancer Cousin    Tremor Neg Hx    Social History   Socioeconomic History   Marital status: Married    Spouse name: Not on file   Number of children: Not on file   Years of education: Not on file   Highest education level: Not on file  Occupational History   Not on file  Tobacco Use   Smoking status: Never   Smokeless tobacco: Never  Vaping Use   Vaping Use: Never used  Substance and Sexual Activity   Alcohol use: Yes    Alcohol/week: 14.0 standard drinks of alcohol    Types: 14 Glasses of wine per week    Comment: 2 glass of red wine daily.   Drug use: Never   Sexual activity: Not on file  Other Topics Concern   Not on file  Social History Narrative   Not on file   Social Determinants of Health   Financial Resource Strain: Low Risk  (08/10/2022)   Overall Financial Resource Strain (CARDIA)    Difficulty of Paying Living Expenses: Not hard at all  Food Insecurity: No Food Insecurity (08/10/2022)   Hunger Vital Sign    Worried About Running Out of Food in the Last Year: Never true    Ran Out of Food in the Last Year: Never true  Transportation Needs: No Transportation Needs (08/10/2022)   PRAPARE -  Hydrologist (Medical): No    Lack of Transportation (Non-Medical): No  Physical Activity: Sufficiently Active (08/10/2022)   Exercise Vital Sign    Days of Exercise per Week: 5 days    Minutes of Exercise per Session: 30 min  Stress: No Stress Concern Present (08/10/2022)   [place]    Feeling of Stress : Not at all  Social Connections: Not on file    Tobacco Counseling Counseling given: Not Answered   Clinical Intake:  Pre-visit preparation completed: Yes  Pain : 0-10 Pain Score: 5  Pain Type: Chronic pain Pain Location: Back Pain Orientation: Lower Pain Descriptors / Indicators: Aching Pain Onset: More than a month ago Pain Frequency: Constant     Nutritional Status: BMI 25 -29 Overweight Nutritional Risks: None Diabetes: No  How often do you need to have someone help you when you read instructions, pamphlets, or other written materials from your doctor or pharmacy?: 1 - Never  Diabetic? no  Interpreter Needed?: No  Information entered by :: NAllen LPN   Activities of Daily Living    08/10/2022    9:22 AM  In your present state of health, do you have any difficulty performing the following activities:  Hearing? 0  Comment wears hearing aids  Vision? 0  Difficulty concentrating or making decisions? 0  Walking or climbing stairs? 0  Dressing or bathing? 0  Doing errands, shopping? 0  Preparing Food and eating ? N  Using the Toilet? N  In the past six months, have you accidently leaked urine? Y  Do you have problems with loss of bowel control? N  Managing your Medications? N  Managing your Finances? N  Housekeeping or managing your Housekeeping? N    Patient Care Team: Libby Maw, MD as PCP - General (Family Medicine)  Indicate any recent Medical Services you may have received from other than [facility] providers in the past year (date may be  approximate).     Assessment:   This is a routine wellness examination for Kerry Coleman.  Hearing/Vision screen Vision Screening - Comments:: Regular eye exams, Dr. Sharen Counter  Dietary issues and exercise activities discussed: Current Exercise Habits: Home exercise routine, Type of exercise: walking, Time (Minutes): 30, Frequency (Times/Week): 5, Weekly Exercise (Minutes/Week): 150   Goals Addressed             This Visit's Progress    Patient Stated       08/10/2022, wants to get through surgery       Depression Screen  08/10/2022    9:22 AM 06/24/2022    9:15 AM 12/19/2021   10:01 AM 09/18/2021   11:02 AM 06/17/2021    2:53 PM 03/10/2021    2:08 PM 09/26/2020   11:05 AM  PHQ 2/9 Scores  PHQ - 2 Score 0 0 0 0 0 0 0    Fall Risk    08/10/2022    9:22 AM 06/24/2022    9:15 AM 12/19/2021   10:01 AM 09/18/2021   11:02 AM 06/17/2021    2:53 PM  [place] in the past year? 0 0 0 0 0  Number falls in past yr: 0 0 0 0 0  Injury with Fall? 0 0     Risk for fall due to : Medication side effect No Fall Risks     Follow up Falls prevention discussed;Education provided;Falls evaluation completed Falls evaluation completed       FALL RISK PREVENTION PERTAINING TO THE HOME:  Any stairs in or around the home? No  If so, are there any without handrails? N/a Home free of loose throw rugs in walkways, pet beds, electrical cords, etc? Yes  Adequate lighting in your home to reduce risk of falls? Yes   ASSISTIVE DEVICES UTILIZED TO PREVENT FALLS:  Life alert? No  Use of a cane, walker or w/c? No  Grab bars in the bathroom? Yes  Shower chair or bench in shower? Yes  Elevated toilet seat or a handicapped toilet? Yes   TIMED UP AND GO:  Was the test performed? No .       Cognitive Function:        08/10/2022    9:24 AM  6CIT Screen  What Year? 0 points  What month? 0 points  What time? 0 points  Count back from 20 0 points  Months in reverse 0 points  Repeat phrase 2  points  Total Score 2 points    Immunizations Immunization History  Administered Date(s) Administered   Fluad Quad(high Dose 65+) 03/07/2019, 03/05/2020, 03/10/2021, 03/12/2022   PFIZER(Purple Top)SARS-COV-2 Vaccination 07/31/2019, 08/25/2019, 03/28/2020   Pneumococcal Conjugate-13 04/16/2014   Pneumococcal Polysaccharide-23 06/25/1994, 06/26/2005   Tdap 05/11/2014   Zoster Recombinat (Shingrix) 07/25/2017, 03/10/2018   Zoster, Live 04/06/2008    TDAP status: Up to date  Flu Vaccine status: Up to date  Pneumococcal vaccine status: Up to date  Covid-19 vaccine status: Completed vaccines  Qualifies for Shingles Vaccine? Yes   Zostavax completed Yes   Shingrix Completed?: Yes  Screening Tests Health Maintenance  Topic Date Due   Medicare Annual Wellness (AWV)  10/24/2020   DTaP/Tdap/Td (2 - Td or Tdap) 05/11/2024   Pneumonia Vaccine 75+ Years old  Completed   INFLUENZA VACCINE  Completed   DEXA SCAN  Completed   Zoster Vaccines- Shingrix  Completed   HPV VACCINES  Aged Out   COVID-19 Vaccine  Discontinued    Health Maintenance  Health Maintenance Due  Topic Date Due   Medicare Annual Wellness (AWV)  10/24/2020    Colorectal cancer screening: No longer required.   Mammogram status: Completed 08/21/2021. Repeat every year  Bone Density status: Completed 05/08/2019.   Lung Cancer Screening: (Low Dose CT Chest recommended if Age 58-80 years, 30 pack-year currently smoking OR have quit w/in 15years.) does not qualify.   Lung Cancer Screening Referral: no  Additional Screening:  Hepatitis C Screening: does not qualify;   Vision Screening: Recommended annual ophthalmology exams for early detection of glaucoma and  other disorders of the eye. Is the patient up to date with their annual eye exam?  Yes  Who is the provider or what is the name of the office in which the patient attends annual eye exams? Dr. Sharen Counter If pt is not established with a provider, would they  like to be referred to a provider to establish care? No .   Dental Screening: Recommended annual dental exams for proper oral hygiene  Community Resource Referral / Chronic Care Management: CRR required this visit?  No   CCM required this visit?  No      Plan:     I have personally reviewed and noted the following in the patient's chart:   Medical and social history Use of alcohol, tobacco or illicit drugs  Current medications and supplements including opioid prescriptions. Patient is not currently taking opioid prescriptions. Functional ability and status Nutritional status Physical activity Advanced directives List of other physicians Hospitalizations, surgeries, and ER visits in previous 12 months Vitals Screenings to include cognitive, depression, and falls Referrals and appointments  In addition, I have reviewed and discussed with patient certain preventive protocols, quality metrics, and best practice recommendations. A written personalized care plan for preventive services as well as general preventive health recommendations were provided to patient.     Kellie Simmering, LPN   [id number]   Nurse Notes: none  Due to this being a virtual visit, the after visit summary with patients personalized plan was offered to patient via mail or my-chart.  Patient would like to access on my-chart

## 2022-08-20 IMAGING — MG MM DIGITAL SCREENING BILAT W/ TOMO AND CAD
8 series · 8 of 24 positions shown · non-contrast
Comparison: Previous exam(s).

CLINICAL DATA: Screening.

EXAM:
DIGITAL SCREENING BILATERAL MAMMOGRAM WITH TOMOSYNTHESIS AND CAD
TECHNIQUE: Bilateral screening digital craniocaudal and mediolateral oblique
mammograms were obtained. Bilateral screening digital breast
tomosynthesis was performed. The images were evaluated with
computer-aided detection.

[R MLO synth-2D]
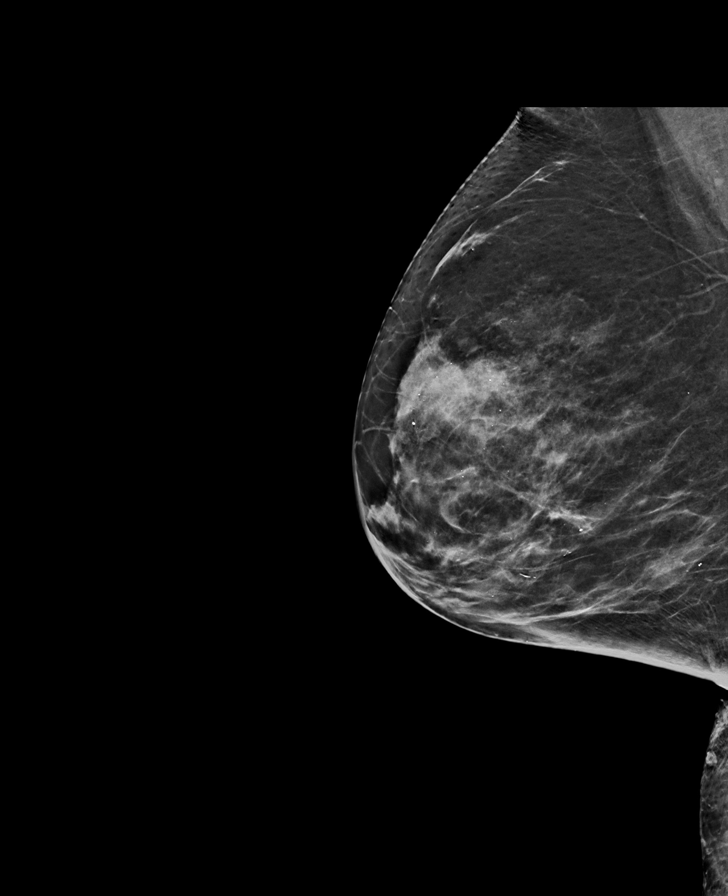

[R CC synth-2D]
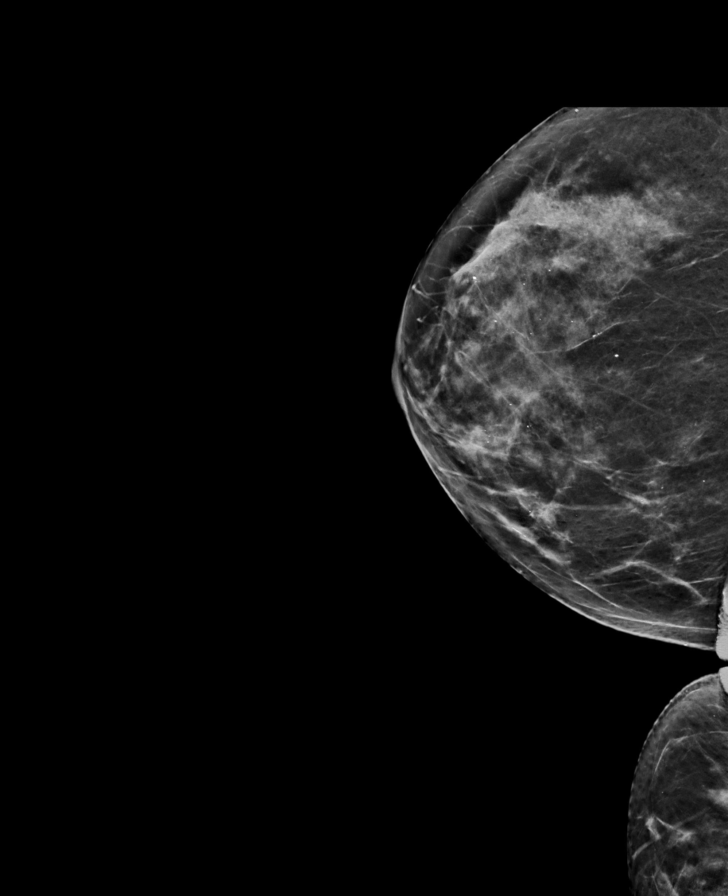

[L CC synth-2D]
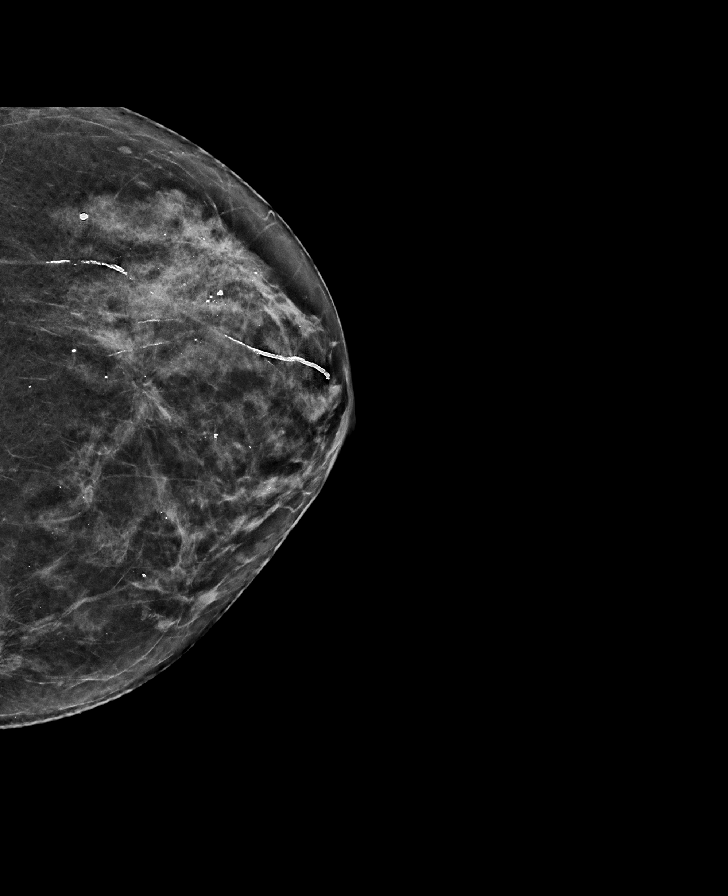

[L MLO synth-2D]
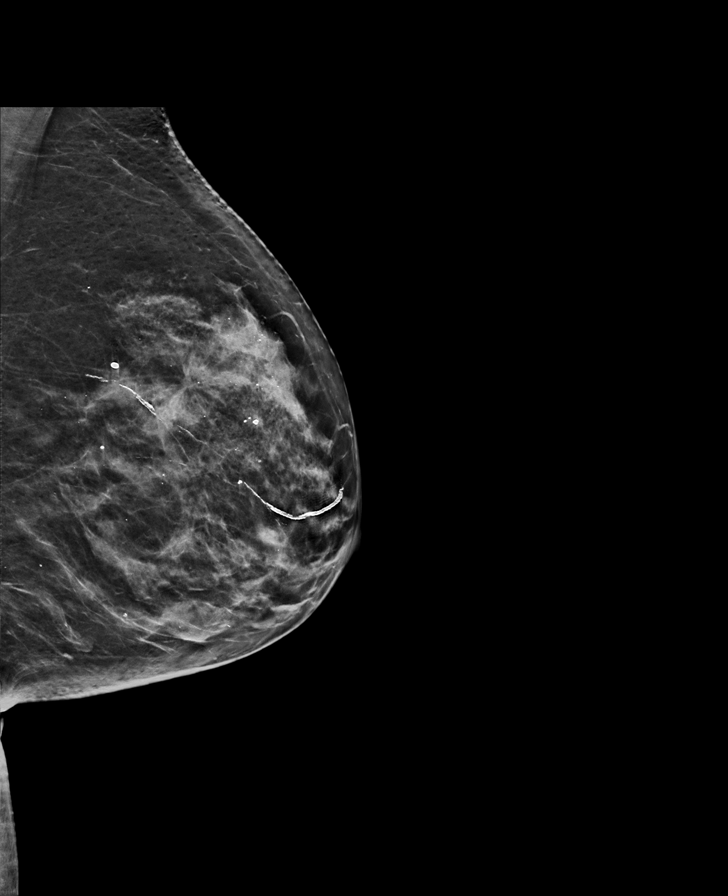

[L CC tomo · tomo slice 31/60.0]
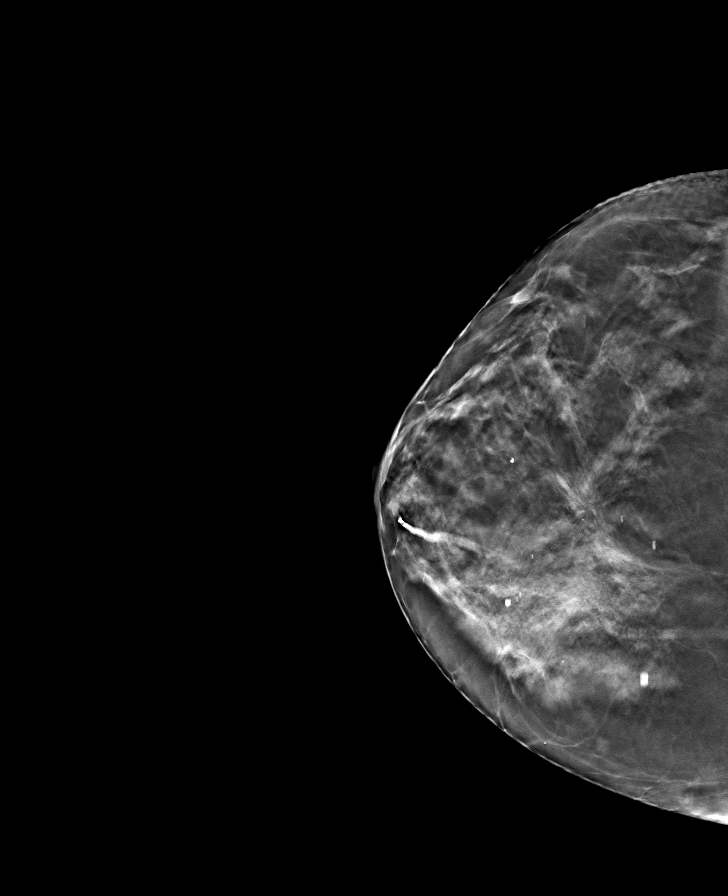

[L MLO tomo · tomo slice 33/64.0]
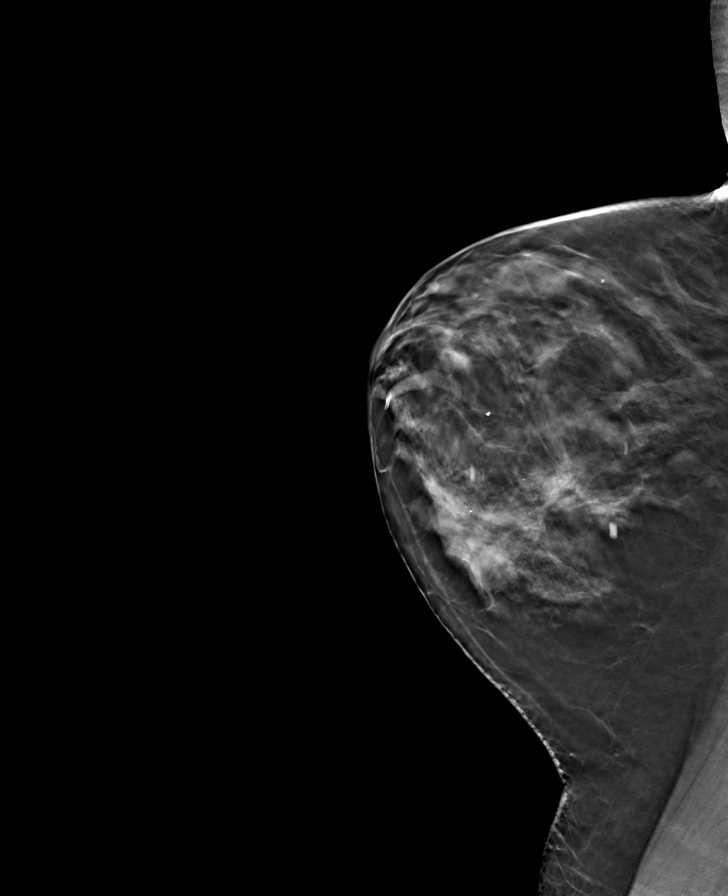

[R MLO tomo · tomo slice 33/65.0]
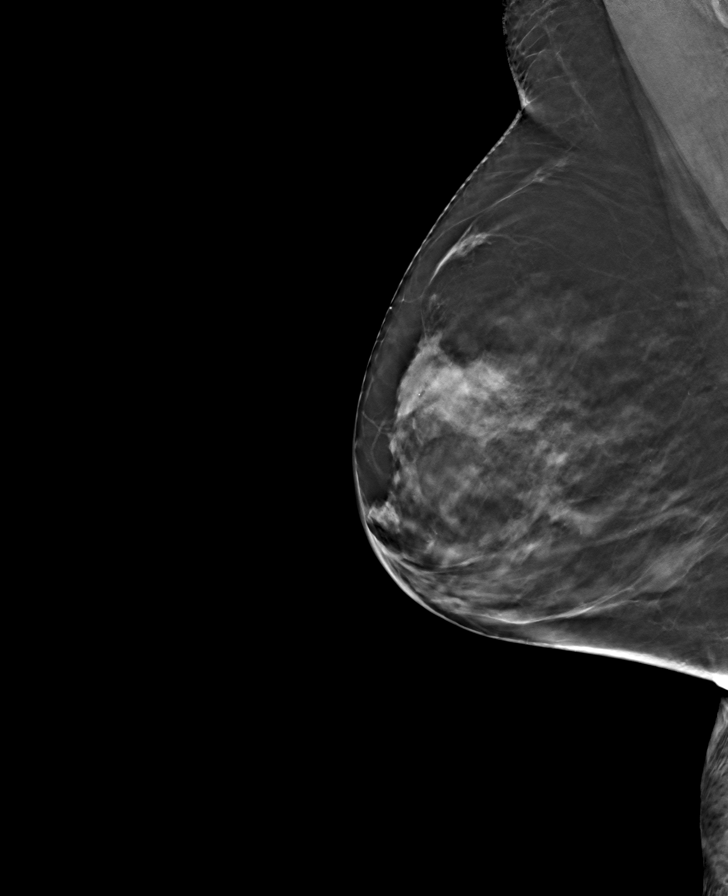

[R CC tomo · tomo slice 33/64.0]
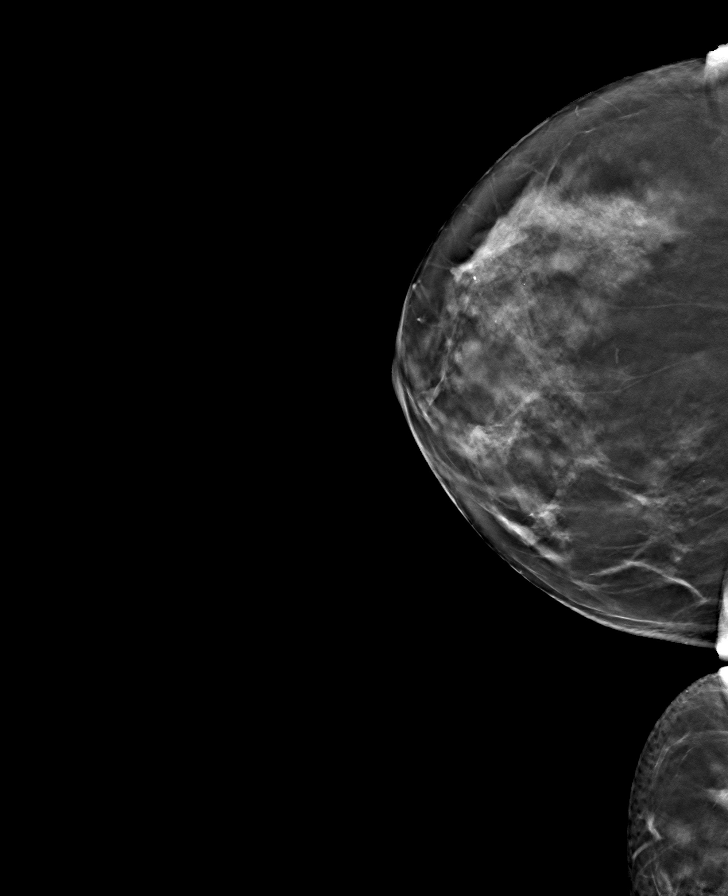

[8 of 24 positions shown; findings below may reference images not displayed]

ACR Breast Density Category c: The breast tissue is heterogeneously
dense, which may obscure small masses.
FINDINGS: There are no findings suspicious for malignancy.
IMPRESSION: No mammographic evidence of malignancy. A result letter of this
screening mammogram will be mailed directly to the patient.

RECOMMENDATION:
Screening mammogram in one year. (Code:Q3-W-BC3)

BI-RADS CATEGORY  1: Negative.

## 2022-08-27 NOTE — Therapy (Signed)
OUTPATIENT PHYSICAL THERAPY THORACOLUMBAR EVALUATION   Patient Name: Kerry Coleman MRN: [id number] DOB:03-05-1935, 88 y.o., female Today's Date: 08/28/2022  END OF SESSION:  PT End of Session - 08/28/22 0753     Visit Number 1    Date for PT Re-Evaluation 10/23/22    Authorization Type Medicare    PT Start Time 0753    PT Stop Time 0840    PT Time Calculation (min) 47 min    Activity Tolerance Patient tolerated treatment well;Patient limited by pain    Behavior During Therapy [facility] for tasks assessed/performed             History reviewed. No pertinent past medical history. Past Surgical History:  Procedure Laterality Date   BACK SURGERY     BREAST BIOPSY Left    @ age 11, Benign    BUNIONECTOMY     KNEE SURGERY     right carpal tunnel release     right index finger excision of mass     ROTATOR CUFF REPAIR     SPINE SURGERY     TONSILLECTOMY     Patient Active Problem List   Diagnosis Date Noted   Chronic left-sided low back pain 06/24/2022   Worries 09/18/2021   Stressful life events affecting family and household 06/17/2021   Urge incontinence 09/26/2020   Tremor of left hand 09/26/2020   Stress incontinence of urine 10/26/2019   Vitamin D deficiency 10/26/2019   Osteopenia of lumbar spine 05/08/2019   History of melanoma 04/27/2019   Encounter for health maintenance examination with abnormal findings 04/27/2019   Estrogen deficiency 04/27/2019   Urinary tract infection with hematuria 03/31/2019   Urinary tract infection without hematuria 03/31/2019   Abnormal urinalysis 03/23/2019   Elevated cholesterol 03/20/2019   Essential hypertension 03/20/2019    PCP: Abelino Derrick  REFERRING PROVIDER: Sherley Bounds  REFERRING DIAG:  Diagnosis  M54.16 (ICD-10-CM) - Radiculopathy, lumbar region    Rationale for Evaluation and Treatment: Rehabilitation  THERAPY DIAG:  Radiculopathy, lumbar region  Other low back pain  Muscle weakness  (generalized)  ONSET DATE: "2 years ago"  SUBJECTIVE:                                                                                                                                                                                           SUBJECTIVE STATEMENT: It is rough because I was supposed to have Thursday but they called and said my insurance was denying it because they wanted me to do therapy. I was here 2 years ago for the same thing, and it was determined I needed an MRI which I  did. I also got injections and they did nothing.   PERTINENT HISTORY:  2 back surgeries- one cyst removed, and L3-4 fusion surgery Bilateral TKA   PAIN:  Are you having pain? Yes: NPRS scale: 6/10 Pain location: mid low back, radiates into bilateral glutes Pain description: dull, ache, shooting down legs sometimes Aggravating factors: bending over, standing for too long Relieving factors: sitting in a chair with heating pad, Aleeve  PRECAUTIONS: None  WEIGHT BEARING RESTRICTIONS: No  FALLS:  Has patient fallen in last 6 months? No  LIVING ENVIRONMENT: Lives with: lives with their spouse Lives in: House/apartment Stairs: No Has following equipment at home: None  OCCUPATION: Retired  PLOF: Independent and Independent with basic ADLs  PATIENT GOALS: to get rid of my pain   OBJECTIVE:   DIAGNOSTIC FINDINGS:  Severe adjacent level stenosis L2-3 with left-sided disc protrusion with a small superior free fragment causing left leg pain.   PATIENT SURVEYS:  FOTO 39  SCREENING FOR RED FLAGS: Bowel or bladder incontinence: No Spinal tumors: No Cauda equina syndrome: No Compression fracture: No Abdominal aneurysm: No  COGNITION: Overall cognitive status: Within functional limits for tasks assessed     SENSATION: WFL  MUSCLE LENGTH: Hamstrings: bilateral tightness  POSTURE: rounded shoulders, decreased lumbar lordosis, and flexed trunk   PALPATION: TTP lumbar spine and mid back  at sacrum   LUMBAR ROM:   AROM eval  Flexion Can touch toes but pain  Extension 75% with pain  Right lateral flexion 50% with pain  Left lateral flexion 50% with pain  Right rotation WFL  Left rotation 75% with pain   (Blank rows = not tested)  LOWER EXTREMITY ROM:   grossly WFL   LOWER EXTREMITY MMT:  grossly 5/5   FUNCTIONAL TESTS:  5 times sit to stand: 18.76s  TODAY'S TREATMENT:                                                                                                                              DATE: 08/28/22-EVAL    PATIENT EDUCATION:  Education details: POC and HEP  Person educated: Patient Education method: Explanation Education comprehension: verbalized understanding  HOME EXERCISE PROGRAM: Access Code: [email or web address] URL: [email or web address] Date: 08/28/2022 Prepared by: Andris Baumann  Exercises - Supine Lower Trunk Rotation  - 1 x daily - 7 x weekly - 2 sets - 10 reps - Supine Bridge  - 1 x daily - 7 x weekly - 2 sets - 10 reps - Clamshell with Resistance  - 1 x daily - 7 x weekly - 2 sets - 10 reps - Seated Hamstring Stretch  - 2 x daily - 7 x weekly - 30 hold  ASSESSMENT:  CLINICAL IMPRESSION: Patient is a 88 y.o. female who was seen today for physical therapy evaluation and treatment for low back pain. Per her doctor, conservative therapy has not provided relief so there is a reasonable likelihood that surgery will significantly improve the  patient's pain and/or impairments. He wants to do a decompressive laminectomy at L2-L3 with posterolateral arthrodesis. However, her insurance denied this and sent her to PT for her back pain. She reports increased pain with most movements, especially bending. She states she is having trouble sleeping and getting in and out of a car due to her pain. She presents with decreased range in her lumbar spine, most of which is limited by pain. Patient will benefit from skilled PT to address her LBP until she is  scheduled for her back surgery.   REHAB POTENTIAL: Fair    CLINICAL DECISION MAKING: Stable/uncomplicated  EVALUATION COMPLEXITY: Low   GOALS: Goals reviewed with patient? Yes  SHORT TERM GOALS: Target date: 09/25/22  Patient will be independent with initial HEP.  Goal status: INITIAL   LONG TERM GOALS: Target date: 10/23/22  Patient will be independent with advanced/ongoing HEP to improve outcomes and carryover.  Goal status: INITIAL  2.  Patient will report 75% improvement in low back pain to improve QOL.  Baseline: 6/10  Goal status: INITIAL  3.  Patient will demonstrate full pain free lumbar ROM to perform ADLs.   Goal status: INITIAL  4.  Patient will demonstrate improved functional strength as demonstrated by <14 on 5xSTS. Baseline: 18.76s Goal status: INITIAL  5.  Patient will report 57 on lumbar FOTO to demonstrate improved functional ability.  Baseline: 39 Goal status: INITIAL   6.  Patient will tolerate 30 min of (standing/sitting/walking) to perform household chores. Baseline: 69mins Goal status: INITIAL  PLAN:  PT FREQUENCY: 2x/week  PT DURATION: 8 weeks  PLANNED INTERVENTIONS: Therapeutic exercises, Therapeutic activity, Neuromuscular re-education, Balance training, Gait training, Patient/Family education, Self Care, Joint mobilization, Stair training, Dry Needling, Electrical stimulation, Spinal mobilization, Cryotherapy, Moist heat, Traction, Ionotophoresis 4mg /ml Dexamethasone, and Manual therapy.  PLAN FOR NEXT SESSION: start light gym activities for low back mobility, stretching   Andris Baumann, PT 08/28/2022, 8:52 AM

## 2022-08-28 ENCOUNTER — Ambulatory Visit: Payer: Medicare Other | Attending: Neurological Surgery

## 2022-08-28 DIAGNOSIS — M6281 Muscle weakness (generalized): Secondary | ICD-10-CM | POA: Insufficient documentation

## 2022-08-28 DIAGNOSIS — M5459 Other low back pain: Secondary | ICD-10-CM | POA: Diagnosis present

## 2022-08-28 DIAGNOSIS — M5416 Radiculopathy, lumbar region: Secondary | ICD-10-CM | POA: Diagnosis not present

## 2022-08-31 ENCOUNTER — Encounter: Payer: Self-pay | Admitting: Physical Therapy

## 2022-08-31 ENCOUNTER — Ambulatory Visit: Payer: Medicare Other | Admitting: Physical Therapy

## 2022-08-31 DIAGNOSIS — M5416 Radiculopathy, lumbar region: Secondary | ICD-10-CM | POA: Diagnosis not present

## 2022-08-31 DIAGNOSIS — M6281 Muscle weakness (generalized): Secondary | ICD-10-CM

## 2022-08-31 DIAGNOSIS — M5459 Other low back pain: Secondary | ICD-10-CM

## 2022-08-31 NOTE — Therapy (Signed)
OUTPATIENT PHYSICAL THERAPY THORACOLUMBAR EVALUATION   Patient Name: Kerry Coleman MRN: [id number] DOB:07-Mar-1935, 88 y.o., female Today's Date: 08/31/2022  END OF SESSION:  PT End of Session - 08/31/22 0758     Visit Number 2    Date for PT Re-Evaluation 10/23/22    Authorization Type Medicare    PT Start Time 0756    PT Stop Time 0842    PT Time Calculation (min) 46 min    Activity Tolerance Patient tolerated treatment well;Patient limited by pain    Behavior During Therapy [facility] for tasks assessed/performed             History reviewed. No pertinent past medical history. Past Surgical History:  Procedure Laterality Date   BACK SURGERY     BREAST BIOPSY Left    @ age 40, Benign    BUNIONECTOMY     KNEE SURGERY     right carpal tunnel release     right index finger excision of mass     ROTATOR CUFF REPAIR     SPINE SURGERY     TONSILLECTOMY     Patient Active Problem List   Diagnosis Date Noted   Chronic left-sided low back pain 06/24/2022   Worries 09/18/2021   Stressful life events affecting family and household 06/17/2021   Urge incontinence 09/26/2020   Tremor of left hand 09/26/2020   Stress incontinence of urine 10/26/2019   Vitamin D deficiency 10/26/2019   Osteopenia of lumbar spine 05/08/2019   History of melanoma 04/27/2019   Encounter for health maintenance examination with abnormal findings 04/27/2019   Estrogen deficiency 04/27/2019   Urinary tract infection with hematuria 03/31/2019   Urinary tract infection without hematuria 03/31/2019   Abnormal urinalysis 03/23/2019   Elevated cholesterol 03/20/2019   Essential hypertension 03/20/2019    PCP: Abelino Derrick  REFERRING PROVIDER: Sherley Bounds  REFERRING DIAG:  Diagnosis  M54.16 (ICD-10-CM) - Radiculopathy, lumbar region    Rationale for Evaluation and Treatment: Rehabilitation  THERAPY DIAG:  Radiculopathy, lumbar region  Other low back pain  Muscle weakness  (generalized)  ONSET DATE: "2 years ago"  SUBJECTIVE:                                                                                                                                                                                           SUBJECTIVE STATEMENT: The HEP was hard, difficulty with lying on the floor, I told her to do them in the floor. Minimal pain at rest, just can't walk, cook or clean like I should  PERTINENT HISTORY:  2 back surgeries- one cyst removed, and L3-4 fusion surgery Bilateral  TKA   PAIN:  Are you having pain? Yes: NPRS scale: 6/10 Pain location: mid low back, radiates into bilateral glutes Pain description: dull, ache, shooting down legs sometimes Aggravating factors: bending over, standing for too long Relieving factors: sitting in a chair with heating pad, Aleeve  PRECAUTIONS: None  WEIGHT BEARING RESTRICTIONS: No  FALLS:  Has patient fallen in last 6 months? No  LIVING ENVIRONMENT: Lives with: lives with their spouse Lives in: House/apartment Stairs: No Has following equipment at home: None  OCCUPATION: Retired  PLOF: Independent and Independent with basic ADLs  PATIENT GOALS: to get rid of my pain   OBJECTIVE:   DIAGNOSTIC FINDINGS:  Severe adjacent level stenosis L2-3 with left-sided disc protrusion with a small superior free fragment causing left leg pain.   PATIENT SURVEYS:  FOTO 39  SCREENING FOR RED FLAGS: Bowel or bladder incontinence: No Spinal tumors: No Cauda equina syndrome: No Compression fracture: No Abdominal aneurysm: No  COGNITION: Overall cognitive status: Within functional limits for tasks assessed     SENSATION: WFL  MUSCLE LENGTH: Hamstrings: bilateral tightness  POSTURE: rounded shoulders, decreased lumbar lordosis, and flexed trunk   PALPATION: TTP lumbar spine and mid back at sacrum   LUMBAR ROM:   AROM eval  Flexion Can touch toes but pain  Extension 75% with pain  Right lateral flexion  50% with pain  Left lateral flexion 50% with pain  Right rotation WFL  Left rotation 75% with pain   (Blank rows = not tested)  LOWER EXTREMITY ROM:   grossly WFL   LOWER EXTREMITY MMT:  grossly 5/5   FUNCTIONAL TESTS:  5 times sit to stand: 18.76s  TODAY'S TREATMENT:                                                                                                                              DATE:  08/31/22 Feet on ball K2C, trunk rotation, small posterior chain isometric activation, isometric abs Passive stretch to the bilateral HS and piriformis Reviewed and performed HEP, had her do the clamshells in hooklying and told her to do small bridges, no pain, I also asked her to add piriformis stretch Nustep levl 5 x 6 minutes Gait outside around the parking island in the back, mild discomfort at the end  PATIENT EDUCATION:  Education details: POC and HEP  Person educated: Patient Education method: Explanation Education comprehension: verbalized understanding  HOME EXERCISE PROGRAM: Access Code: [email or web address] URL: [email or web address] Date: 08/28/2022 Prepared by: Andris Baumann  Exercises - Supine Lower Trunk Rotation  - 1 x daily - 7 x weekly - 2 sets - 10 reps - Supine Bridge  - 1 x daily - 7 x weekly - 2 sets - 10 reps - Clamshell with Resistance  - 1 x daily - 7 x weekly - 2 sets - 10 reps - Seated Hamstring Stretch  - 2 x daily - 7 x weekly - 30 hold  ASSESSMENT:  CLINICAL IMPRESSION: Patient continues to have tightness and pain with the low back, worse with activity, she was doing the HEP in the floor and asked her to do int he bed as she was struggling getting up from the floor.  We initiated gym activities with no real pain, pain only with the longer walk.   REHAB POTENTIAL: Fair    CLINICAL DECISION MAKING: Stable/uncomplicated  EVALUATION COMPLEXITY: Low   GOALS: Goals reviewed with patient? Yes  SHORT TERM GOALS: Target date: 09/25/22  Patient  will be independent with initial HEP.  Goal status: adjusted and progressing 08/31/22   LONG TERM GOALS: Target date: 10/23/22  Patient will be independent with advanced/ongoing HEP to improve outcomes and carryover.  Goal status: INITIAL  2.  Patient will report 75% improvement in low back pain to improve QOL.  Baseline: 6/10  Goal status: INITIAL  3.  Patient will demonstrate full pain free lumbar ROM to perform ADLs.   Goal status: INITIAL  4.  Patient will demonstrate improved functional strength as demonstrated by <14 on 5xSTS. Baseline: 18.76s Goal status: INITIAL  5.  Patient will report 13 on lumbar FOTO to demonstrate improved functional ability.  Baseline: 39 Goal status: INITIAL   6.  Patient will tolerate 30 min of (standing/sitting/walking) to perform household chores. Baseline: 9mns Goal status: INITIAL  PLAN:  PT FREQUENCY: 2x/week  PT DURATION: 8 weeks  PLANNED INTERVENTIONS: Therapeutic exercises, Therapeutic activity, Neuromuscular re-education, Balance training, Gait training, Patient/Family education, Self Care, Joint mobilization, Stair training, Dry Needling, Electrical stimulation, Spinal mobilization, Cryotherapy, Moist heat, Traction, Ionotophoresis '4mg'$ /ml Dexamethasone, and Manual therapy.  PLAN FOR NEXT SESSION: start light gym activities for low back mobility, stretching   Augustus Zurawski W, PT 08/31/2022, 7:59 AM

## 2022-09-09 ENCOUNTER — Ambulatory Visit: Payer: Medicare Other | Admitting: Physical Therapy

## 2022-09-09 ENCOUNTER — Encounter: Payer: Self-pay | Admitting: Physical Therapy

## 2022-09-09 DIAGNOSIS — M6281 Muscle weakness (generalized): Secondary | ICD-10-CM

## 2022-09-09 DIAGNOSIS — M5459 Other low back pain: Secondary | ICD-10-CM

## 2022-09-09 DIAGNOSIS — M5416 Radiculopathy, lumbar region: Secondary | ICD-10-CM | POA: Diagnosis not present

## 2022-09-09 NOTE — Therapy (Signed)
OUTPATIENT PHYSICAL THERAPY THORACOLUMBAR EVALUATION   Patient Name: Kerry Coleman MRN: [id number] DOB:[birth year], 88 y.o., female Today's Date: 09/09/2022  END OF SESSION:  PT End of Session - 09/09/22 1015     Visit Number 3    Date for PT Re-Evaluation 10/23/22    PT Start Time [id number]    PT Stop Time 1100    PT Time Calculation (min) 45 min    Activity Tolerance Patient tolerated treatment well;Patient limited by pain    Behavior During Therapy [facility] for tasks assessed/performed             History reviewed. No pertinent past medical history. Past Surgical History:  Procedure Laterality Date   BACK SURGERY     BREAST BIOPSY Left    @ age 30, Benign    BUNIONECTOMY     KNEE SURGERY     right carpal tunnel release     right index finger excision of mass     ROTATOR CUFF REPAIR     SPINE SURGERY     TONSILLECTOMY     Patient Active Problem List   Diagnosis Date Noted   Chronic left-sided low back pain 06/24/2022   Worries 09/18/2021   Stressful life events affecting family and household 06/17/2021   Urge incontinence 09/26/2020   Tremor of left hand 09/26/2020   Stress incontinence of urine 10/26/2019   Vitamin D deficiency 10/26/2019   Osteopenia of lumbar spine 05/08/2019   History of melanoma 04/27/2019   Encounter for health maintenance examination with abnormal findings 04/27/2019   Estrogen deficiency 04/27/2019   Urinary tract infection with hematuria 03/31/2019   Urinary tract infection without hematuria 03/31/2019   Abnormal urinalysis 03/23/2019   Elevated cholesterol 03/20/2019   Essential hypertension 03/20/2019    PCP: Abelino Derrick  REFERRING PROVIDER: Sherley Bounds  REFERRING DIAG:  Diagnosis  M54.16 (ICD-10-CM) - Radiculopathy, lumbar region    Rationale for Evaluation and Treatment: Rehabilitation  THERAPY DIAG:  Radiculopathy, lumbar region  Other low back pain  Muscle weakness (generalized)  ONSET DATE: "2 years  ago"  SUBJECTIVE:                                                                                                                                                                                           SUBJECTIVE STATEMENT: A little creaky   PERTINENT HISTORY:  2 back surgeries- one cyst removed, and L3-4 fusion surgery Bilateral TKA   PAIN:  Are you having pain? Yes: NPRS scale: 4/10 Pain location: mid low back, radiates into bilateral glutes Pain description: dull, ache, shooting down legs sometimes Aggravating factors: bending over,  standing for too long Relieving factors: sitting in a chair with heating pad, Aleeve  PRECAUTIONS: None  WEIGHT BEARING RESTRICTIONS: No  FALLS:  Has patient fallen in last 6 months? No  LIVING ENVIRONMENT: Lives with: lives with their spouse Lives in: House/apartment Stairs: No Has following equipment at home: None  OCCUPATION: Retired  PLOF: Independent and Independent with basic ADLs  PATIENT GOALS: to get rid of my pain   OBJECTIVE:   DIAGNOSTIC FINDINGS:  Severe adjacent level stenosis L2-3 with left-sided disc protrusion with a small superior free fragment causing left leg pain.   PATIENT SURVEYS:  FOTO 39  SCREENING FOR RED FLAGS: Bowel or bladder incontinence: No Spinal tumors: No Cauda equina syndrome: No Compression fracture: No Abdominal aneurysm: No  COGNITION: Overall cognitive status: Within functional limits for tasks assessed     SENSATION: WFL  MUSCLE LENGTH: Hamstrings: bilateral tightness  POSTURE: rounded shoulders, decreased lumbar lordosis, and flexed trunk   PALPATION: TTP lumbar spine and mid back at sacrum   LUMBAR ROM:   AROM eval  Flexion Can touch toes but pain  Extension 75% with pain  Right lateral flexion 50% with pain  Left lateral flexion 50% with pain  Right rotation WFL  Left rotation 75% with pain   (Blank rows = not tested)  LOWER EXTREMITY ROM:   grossly WFL   LOWER  EXTREMITY MMT:  grossly 5/5   FUNCTIONAL TESTS:  5 times sit to stand: 18.76s  TODAY'S TREATMENT:                                                                                                                              DATE:  3/20/424 Bridges x10, Lower trunk rotations x10, hook lying clam red band x10 Passive stretch to the bilateral HS and piriformis NuStep L 4 x 6 min  Shoulder Ext & Rows Red 2x10 Hamstring curls 15lb 2x10   08/31/22 Feet on ball K2C, trunk rotation, small posterior chain isometric activation, isometric abs Passive stretch to the bilateral HS and piriformis Reviewed and performed HEP, had her do the clamshells in hooklying and told her to do small bridges, no pain, I also asked her to add piriformis stretch Nustep levl 5 x 6 minutes Gait outside around the parking island in the back, mild discomfort at the end  PATIENT EDUCATION:  Education details: POC and HEP  Person educated: Patient Education method: Explanation Education comprehension: verbalized understanding  HOME EXERCISE PROGRAM: Access Code: [email or web address] URL: [email or web address] Date: 08/28/2022 Prepared by: Andris Baumann  Exercises - Supine Lower Trunk Rotation  - 1 x daily - 7 x weekly - 2 sets - 10 reps - Supine Bridge  - 1 x daily - 7 x weekly - 2 sets - 10 reps - Clamshell with Resistance  - 1 x daily - 7 x weekly - 2 sets - 10 reps - Seated Hamstring Stretch  - 2 x daily - 7 x weekly -  30 hold  ASSESSMENT:  CLINICAL IMPRESSION: Patient continues to have tightness and pain with the low back, worse with activity. Went over HEP interventions to make sure she was completing them correctly.  Progressed to more gym activities with no real pain. Explained stenosis to the PT  REHAB POTENTIAL: Fair    CLINICAL DECISION MAKING: Stable/uncomplicated  EVALUATION COMPLEXITY: Low   GOALS: Goals reviewed with patient? Yes  SHORT TERM GOALS: Target date: 09/25/22  Patient will  be independent with initial HEP.  Goal status: adjusted and progressing 09/09/22   LONG TERM GOALS: Target date: 10/23/22  Patient will be independent with advanced/ongoing HEP to improve outcomes and carryover.  Goal status: INITIAL  2.  Patient will report 75% improvement in low back pain to improve QOL.  Baseline: 6/10  Goal status: INITIAL  3.  Patient will demonstrate full pain free lumbar ROM to perform ADLs.   Goal status: INITIAL  4.  Patient will demonstrate improved functional strength as demonstrated by <14 on 5xSTS. Baseline: 18.76s Goal status: INITIAL  5.  Patient will report 82 on lumbar FOTO to demonstrate improved functional ability.  Baseline: 39 Goal status: INITIAL   6.  Patient will tolerate 30 min of (standing/sitting/walking) to perform household chores. Baseline: 75mins Goal status: INITIAL  PLAN:  PT FREQUENCY: 2x/week  PT DURATION: 8 weeks  PLANNED INTERVENTIONS: Therapeutic exercises, Therapeutic activity, Neuromuscular re-education, Balance training, Gait training, Patient/Family education, Self Care, Joint mobilization, Stair training, Dry Needling, Electrical stimulation, Spinal mobilization, Cryotherapy, Moist heat, Traction, Ionotophoresis 4mg /ml Dexamethasone, and Manual therapy.  PLAN FOR NEXT SESSION: start light gym activities for low back mobility, stretching   Scot Jun, PTA 09/09/2022, 10:15 AM

## 2022-09-11 ENCOUNTER — Ambulatory Visit: Payer: Medicare Other | Admitting: Physical Therapy

## 2022-09-11 ENCOUNTER — Encounter: Payer: Self-pay | Admitting: Physical Therapy

## 2022-09-11 DIAGNOSIS — M5416 Radiculopathy, lumbar region: Secondary | ICD-10-CM | POA: Diagnosis not present

## 2022-09-11 DIAGNOSIS — M5459 Other low back pain: Secondary | ICD-10-CM

## 2022-09-11 DIAGNOSIS — M6281 Muscle weakness (generalized): Secondary | ICD-10-CM

## 2022-09-11 NOTE — Therapy (Signed)
OUTPATIENT PHYSICAL THERAPY THORACOLUMBAR EVALUATION   Patient Name: Kerry Coleman MRN: [id number] DOB:11/04/1934, 88 y.o., female Today's Date: 09/11/2022  END OF SESSION:  PT End of Session - 09/11/22 0930     Visit Number 4    Date for PT Re-Evaluation 10/23/22    PT Start Time 0930    PT Stop Time 1015    PT Time Calculation (min) 45 min    Activity Tolerance Patient tolerated treatment well    Behavior During Therapy [facility] for tasks assessed/performed             History reviewed. No pertinent past medical history. Past Surgical History:  Procedure Laterality Date   BACK SURGERY     BREAST BIOPSY Left    @ age 68, Benign    BUNIONECTOMY     KNEE SURGERY     right carpal tunnel release     right index finger excision of mass     ROTATOR CUFF REPAIR     SPINE SURGERY     TONSILLECTOMY     Patient Active Problem List   Diagnosis Date Noted   Chronic left-sided low back pain 06/24/2022   Worries 09/18/2021   Stressful life events affecting family and household 06/17/2021   Urge incontinence 09/26/2020   Tremor of left hand 09/26/2020   Stress incontinence of urine 10/26/2019   Vitamin D deficiency 10/26/2019   Osteopenia of lumbar spine 05/08/2019   History of melanoma 04/27/2019   Encounter for health maintenance examination with abnormal findings 04/27/2019   Estrogen deficiency 04/27/2019   Urinary tract infection with hematuria 03/31/2019   Urinary tract infection without hematuria 03/31/2019   Abnormal urinalysis 03/23/2019   Elevated cholesterol 03/20/2019   Essential hypertension 03/20/2019    PCP: Abelino Derrick  REFERRING PROVIDER: Sherley Bounds  REFERRING DIAG:  Diagnosis  M54.16 (ICD-10-CM) - Radiculopathy, lumbar region    Rationale for Evaluation and Treatment: Rehabilitation  THERAPY DIAG:  Radiculopathy, lumbar region  Other low back pain  Muscle weakness (generalized)  ONSET DATE: "2 years ago"  SUBJECTIVE:                                                                                                                                                                                            SUBJECTIVE STATEMENT: A little achy across the back and hip  PERTINENT HISTORY:  2 back surgeries- one cyst removed, and L3-4 fusion surgery Bilateral TKA   PAIN:  Are you having pain? Yes: NPRS scale: 4/10 Pain location: mid low back, radiates into bilateral glutes Pain description: dull, ache, shooting down legs sometimes Aggravating factors: bending  over, standing for too long Relieving factors: sitting in a chair with heating pad, Aleeve  PRECAUTIONS: None  WEIGHT BEARING RESTRICTIONS: No  FALLS:  Has patient fallen in last 6 months? No  LIVING ENVIRONMENT: Lives with: lives with their spouse Lives in: House/apartment Stairs: No Has following equipment at home: None  OCCUPATION: Retired  PLOF: Independent and Independent with basic ADLs  PATIENT GOALS: to get rid of my pain   OBJECTIVE:   DIAGNOSTIC FINDINGS:  Severe adjacent level stenosis L2-3 with left-sided disc protrusion with a small superior free fragment causing left leg pain.   PATIENT SURVEYS:  FOTO 39  SCREENING FOR RED FLAGS: Bowel or bladder incontinence: No Spinal tumors: No Cauda equina syndrome: No Compression fracture: No Abdominal aneurysm: No  COGNITION: Overall cognitive status: Within functional limits for tasks assessed     SENSATION: WFL  MUSCLE LENGTH: Hamstrings: bilateral tightness  POSTURE: rounded shoulders, decreased lumbar lordosis, and flexed trunk   PALPATION: TTP lumbar spine and mid back at sacrum   LUMBAR ROM:   AROM eval  Flexion Can touch toes but pain  Extension 75% with pain  Right lateral flexion 50% with pain  Left lateral flexion 50% with pain  Right rotation WFL  Left rotation 75% with pain   (Blank rows = not tested)  LOWER EXTREMITY ROM:   grossly WFL   LOWER EXTREMITY MMT:   grossly 5/5   FUNCTIONAL TESTS:  5 times sit to stand: 18.76s  TODAY'S TREATMENT:                                                                                                                              DATE:  09/11/22 NuStep L5 x 6 min Shoulder Ext 5lb 2x10 Standing Rows 5lb 2x10 S2S 2x5  Bridges 2x10 Feet on ball K2C, trunk rotation, small posterior chain isometric activation, isometric abs Passive stretch to the bilateral HS and piriformis, single K2C, lower trunk rotations  3/20/424 Bridges x10, Lower trunk rotations x10, hook lying clam red band x10 Passive stretch to the bilateral HS and piriformis NuStep L 4 x 6 min  Shoulder Ext & Rows Red 2x10 Hamstring curls 15lb 2x10   08/31/22 Feet on ball K2C, trunk rotation, small posterior chain isometric activation, isometric abs Passive stretch to the bilateral HS and piriformis Reviewed and performed HEP, had her do the clamshells in hooklying and told her to do small bridges, no pain, I also asked her to add piriformis stretch Nustep levl 5 x 6 minutes Gait outside around the parking island in the back, mild discomfort at the end  PATIENT EDUCATION:  Education details: POC and HEP  Person educated: Patient Education method: Explanation Education comprehension: verbalized understanding  HOME EXERCISE PROGRAM: Access Code: [email or web address] URL: [email or web address] Date: 08/28/2022 Prepared by: Andris Baumann  Exercises - Supine Lower Trunk Rotation  - 1 x daily - 7 x weekly - 2 sets - 10 reps - Supine Bridge  -  1 x daily - 7 x weekly - 2 sets - 10 reps - Clamshell with Resistance  - 1 x daily - 7 x weekly - 2 sets - 10 reps - Seated Hamstring Stretch  - 2 x daily - 7 x weekly - 30 hold  ASSESSMENT:  CLINICAL IMPRESSION: Patient continues to have tightness and pain with the low back, worse with activity. Added more postural strengthening with machine level interventions. Postural cues needed with standing  rows and shoulder Ext to keep shoulders down and back. Bilateral LE tightness with passive stretching.   REHAB POTENTIAL: Fair    CLINICAL DECISION MAKING: Stable/uncomplicated  EVALUATION COMPLEXITY: Low   GOALS: Goals reviewed with patient? Yes  SHORT TERM GOALS: Target date: 09/25/22  Patient will be independent with initial HEP.  Goal status: adjusted and progressing 09/09/22   LONG TERM GOALS: Target date: 10/23/22  Patient will be independent with advanced/ongoing HEP to improve outcomes and carryover.  Goal status: INITIAL  2.  Patient will report 75% improvement in low back pain to improve QOL.  Baseline: 6/10  Goal status: INITIAL  3.  Patient will demonstrate full pain free lumbar ROM to perform ADLs.   Goal status: INITIAL  4.  Patient will demonstrate improved functional strength as demonstrated by <14 on 5xSTS. Baseline: 18.76s Goal status: INITIAL  5.  Patient will report 14 on lumbar FOTO to demonstrate improved functional ability.  Baseline: 39 Goal status: INITIAL   6.  Patient will tolerate 30 min of (standing/sitting/walking) to perform household chores. Baseline: 45mins Goal status: INITIAL  PLAN:  PT FREQUENCY: 2x/week  PT DURATION: 8 weeks  PLANNED INTERVENTIONS: Therapeutic exercises, Therapeutic activity, Neuromuscular re-education, Balance training, Gait training, Patient/Family education, Self Care, Joint mobilization, Stair training, Dry Needling, Electrical stimulation, Spinal mobilization, Cryotherapy, Moist heat, Traction, Ionotophoresis 4mg /ml Dexamethasone, and Manual therapy.  PLAN FOR NEXT SESSION: start light gym activities for low back mobility, stretching   Scot Jun, PTA 09/11/2022, 9:30 AM

## 2022-09-15 ENCOUNTER — Ambulatory Visit: Payer: Medicare Other | Admitting: Physical Therapy

## 2022-09-15 ENCOUNTER — Encounter: Payer: Self-pay | Admitting: Physical Therapy

## 2022-09-15 DIAGNOSIS — M6281 Muscle weakness (generalized): Secondary | ICD-10-CM

## 2022-09-15 DIAGNOSIS — M5459 Other low back pain: Secondary | ICD-10-CM

## 2022-09-15 DIAGNOSIS — M5416 Radiculopathy, lumbar region: Secondary | ICD-10-CM | POA: Diagnosis not present

## 2022-09-15 NOTE — Therapy (Signed)
OUTPATIENT PHYSICAL THERAPY THORACOLUMBAR EVALUATION   Patient Name: Kerry Coleman MRN: [id number] DOB:Sep 01, 1934, 88 y.o., female Today's Date: 09/15/2022  END OF SESSION:  PT End of Session - 09/15/22 0847     Visit Number 5    Date for PT Re-Evaluation 10/23/22    Authorization Type Medicare    PT Start Time 0843    PT Stop Time 0928    PT Time Calculation (min) 45 min    Activity Tolerance Patient tolerated treatment well    Behavior During Therapy [facility] for tasks assessed/performed             History reviewed. No pertinent past medical history. Past Surgical History:  Procedure Laterality Date   BACK SURGERY     BREAST BIOPSY Left    @ age 63, Benign    BUNIONECTOMY     KNEE SURGERY     right carpal tunnel release     right index finger excision of mass     ROTATOR CUFF REPAIR     SPINE SURGERY     TONSILLECTOMY     Patient Active Problem List   Diagnosis Date Noted   Chronic left-sided low back pain 06/24/2022   Worries 09/18/2021   Stressful life events affecting family and household 06/17/2021   Urge incontinence 09/26/2020   Tremor of left hand 09/26/2020   Stress incontinence of urine 10/26/2019   Vitamin D deficiency 10/26/2019   Osteopenia of lumbar spine 05/08/2019   History of melanoma 04/27/2019   Encounter for health maintenance examination with abnormal findings 04/27/2019   Estrogen deficiency 04/27/2019   Urinary tract infection with hematuria 03/31/2019   Urinary tract infection without hematuria 03/31/2019   Abnormal urinalysis 03/23/2019   Elevated cholesterol 03/20/2019   Essential hypertension 03/20/2019    PCP: Abelino Derrick  REFERRING PROVIDER: Sherley Bounds  REFERRING DIAG:  Diagnosis  M54.16 (ICD-10-CM) - Radiculopathy, lumbar region    Rationale for Evaluation and Treatment: Rehabilitation  THERAPY DIAG:  Radiculopathy, lumbar region  Other low back pain  Muscle weakness (generalized)  ONSET DATE: "2 years  ago"  SUBJECTIVE:                                                                                                                                                                                           SUBJECTIVE STATEMENT: Just still really hurt int he back with standing and walking.  PERTINENT HISTORY:  2 back surgeries- one cyst removed, and L3-4 fusion surgery Bilateral TKA   PAIN:  Are you having pain? Yes: NPRS scale: 4/10 Pain location: mid low back, radiates into bilateral glutes Pain description:  dull, ache, shooting down legs sometimes Aggravating factors: bending over, standing for too long Relieving factors: sitting in a chair with heating pad, Aleeve  PRECAUTIONS: None  WEIGHT BEARING RESTRICTIONS: No  FALLS:  Has patient fallen in last 6 months? No  LIVING ENVIRONMENT: Lives with: lives with their spouse Lives in: House/apartment Stairs: No Has following equipment at home: None  OCCUPATION: Retired  PLOF: Independent and Independent with basic ADLs  PATIENT GOALS: to get rid of my pain   OBJECTIVE:   DIAGNOSTIC FINDINGS:  Severe adjacent level stenosis L2-3 with left-sided disc protrusion with a small superior free fragment causing left leg pain.   PATIENT SURVEYS:  FOTO 39  SCREENING FOR RED FLAGS: Bowel or bladder incontinence: No Spinal tumors: No Cauda equina syndrome: No Compression fracture: No Abdominal aneurysm: No  COGNITION: Overall cognitive status: Within functional limits for tasks assessed     SENSATION: WFL  MUSCLE LENGTH: Hamstrings: bilateral tightness  POSTURE: rounded shoulders, decreased lumbar lordosis, and flexed trunk   PALPATION: TTP lumbar spine and mid back at sacrum   LUMBAR ROM:   AROM eval  Flexion Can touch toes but pain  Extension 75% with pain  Right lateral flexion 50% with pain  Left lateral flexion 50% with pain  Right rotation WFL  Left rotation 75% with pain   (Blank rows = not  tested)  LOWER EXTREMITY ROM:   grossly WFL   LOWER EXTREMITY MMT:  grossly 5/5   FUNCTIONAL TESTS:  5 times sit to stand: 18.76s  TODAY'S TREATMENT:                                                                                                                              DATE:  09/15/22 Nustep level 5 x 6 minutes Seated row 15# 2x10 Lat pulls 15# 2x10 Feet on ball K2C,  rotation, small bridge and isometric abs Green tband clamshells Ball squeeze between knees bridges Passive HS and piriformis stretch 5# straight arm pulls cues for core and posture 5# AR press needed some help Standing hip abduction and extension x 10 each  09/11/22 NuStep L5 x 6 min Shoulder Ext 5lb 2x10 Standing Rows 5lb 2x10 S2S 2x5  Bridges 2x10 Feet on ball K2C, trunk rotation, small posterior chain isometric activation, isometric abs Passive stretch to the bilateral HS and piriformis, single K2C, lower trunk rotations  3/20/424 Bridges x10, Lower trunk rotations x10, hook lying clam red band x10 Passive stretch to the bilateral HS and piriformis NuStep L 4 x 6 min  Shoulder Ext & Rows Red 2x10 Hamstring curls 15lb 2x10   08/31/22 Feet on ball K2C, trunk rotation, small posterior chain isometric activation, isometric abs Passive stretch to the bilateral HS and piriformis Reviewed and performed HEP, had her do the clamshells in hooklying and told her to do small bridges, no pain, I also asked her to add piriformis stretch Nustep levl 5 x 6 minutes Gait outside  around the parking island in the back, mild discomfort at the end  PATIENT EDUCATION:  Education details: POC and HEP  Person educated: Patient Education method: Explanation Education comprehension: verbalized understanding  HOME EXERCISE PROGRAM: Access Code: [email or web address] URL: [email or web address] Date: 08/28/2022 Prepared by: Andris Baumann  Exercises - Supine Lower Trunk Rotation  - 1 x daily - 7 x weekly - 2 sets - 10  reps - Supine Bridge  - 1 x daily - 7 x weekly - 2 sets - 10 reps - Clamshell with Resistance  - 1 x daily - 7 x weekly - 2 sets - 10 reps - Seated Hamstring Stretch  - 2 x daily - 7 x weekly - 30 hold  ASSESSMENT:  CLINICAL IMPRESSION: Patient continues to have tightness and pain with the low back, reports stiff and sore in the AM, encouraged her to stretch before getting out of bed, she reports pain with standing and walking.  Needed some help with the exercises especially form on lats and rows.  REHAB POTENTIAL: Fair    CLINICAL DECISION MAKING: Stable/uncomplicated  EVALUATION COMPLEXITY: Low   GOALS: Goals reviewed with patient? Yes  SHORT TERM GOALS: Target date: 09/25/22  Patient will be independent with initial HEP.  Goal status: adjusted and progressing 09/09/22   LONG TERM GOALS: Target date: 10/23/22  Patient will be independent with advanced/ongoing HEP to improve outcomes and carryover.  Goal status: INITIAL  2.  Patient will report 75% improvement in low back pain to improve QOL.  Baseline: 6/10  Goal status: progressing  3.  Patient will demonstrate full pain free lumbar ROM to perform ADLs.   Goal status:progressing  4.  Patient will demonstrate improved functional strength as demonstrated by <14 on 5xSTS. Baseline: 18.76s Goal status: INITIAL  5.  Patient will report 2 on lumbar FOTO to demonstrate improved functional ability.  Baseline: 39 Goal status: INITIAL   6.  Patient will tolerate 30 min of (standing/sitting/walking) to perform household chores. Baseline: 31mins Goal status: INITIAL  PLAN:  PT FREQUENCY: 2x/week  PT DURATION: 8 weeks  PLANNED INTERVENTIONS: Therapeutic exercises, Therapeutic activity, Neuromuscular re-education, Balance training, Gait training, Patient/Family education, Self Care, Joint mobilization, Stair training, Dry Needling, Electrical stimulation, Spinal mobilization, Cryotherapy, Moist heat, Traction, Ionotophoresis  4mg /ml Dexamethasone, and Manual therapy.  PLAN FOR NEXT SESSION: start light gym activities for low back mobility, stretching   Lateef Juncaj W, PT 09/15/2022, 8:47 AM

## 2022-09-17 ENCOUNTER — Ambulatory Visit: Payer: Medicare Other | Admitting: Physical Therapy

## 2022-09-17 ENCOUNTER — Encounter: Payer: Self-pay | Admitting: Physical Therapy

## 2022-09-17 DIAGNOSIS — M5416 Radiculopathy, lumbar region: Secondary | ICD-10-CM

## 2022-09-17 DIAGNOSIS — M6281 Muscle weakness (generalized): Secondary | ICD-10-CM

## 2022-09-17 DIAGNOSIS — M5459 Other low back pain: Secondary | ICD-10-CM

## 2022-09-17 NOTE — Therapy (Signed)
OUTPATIENT PHYSICAL THERAPY THORACOLUMBAR EVALUATION   Patient Name: Kerry Coleman MRN: [id number] DOB:February 14, 1935, 88 y.o., female Today's Date: 09/17/2022  END OF SESSION:  PT End of Session - 09/17/22 0843     Visit Number 6    Date for PT Re-Evaluation 10/23/22    Authorization Type Medicare    PT Start Time 0843    PT Stop Time 0928    PT Time Calculation (min) 45 min    Activity Tolerance Patient tolerated treatment well    Behavior During Therapy [facility] for tasks assessed/performed             History reviewed. No pertinent past medical history. Past Surgical History:  Procedure Laterality Date   BACK SURGERY     BREAST BIOPSY Left    @ age 54, Benign    BUNIONECTOMY     KNEE SURGERY     right carpal tunnel release     right index finger excision of mass     ROTATOR CUFF REPAIR     SPINE SURGERY     TONSILLECTOMY     Patient Active Problem List   Diagnosis Date Noted   Chronic left-sided low back pain 06/24/2022   Worries 09/18/2021   Stressful life events affecting family and household 06/17/2021   Urge incontinence 09/26/2020   Tremor of left hand 09/26/2020   Stress incontinence of urine 10/26/2019   Vitamin D deficiency 10/26/2019   Osteopenia of lumbar spine 05/08/2019   History of melanoma 04/27/2019   Encounter for health maintenance examination with abnormal findings 04/27/2019   Estrogen deficiency 04/27/2019   Urinary tract infection with hematuria 03/31/2019   Urinary tract infection without hematuria 03/31/2019   Abnormal urinalysis 03/23/2019   Elevated cholesterol 03/20/2019   Essential hypertension 03/20/2019    PCP: Abelino Derrick  REFERRING PROVIDER: Sherley Bounds  REFERRING DIAG:  Diagnosis  M54.16 (ICD-10-CM) - Radiculopathy, lumbar region    Rationale for Evaluation and Treatment: Rehabilitation  THERAPY DIAG:  Radiculopathy, lumbar region  Other low back pain  Muscle weakness (generalized)  ONSET DATE: "2 years  ago"  SUBJECTIVE:                                                                                                                                                                                           SUBJECTIVE STATEMENT: Reports that she had a birthday yesterday and is a little more sore as she reports that she was up and very active all day ysterday, she asked about TENS  PERTINENT HISTORY:  2 back surgeries- one cyst removed, and L3-4 fusion surgery Bilateral TKA   PAIN:  Are you having pain? Yes: NPRS scale: 6/10 Pain location: mid low back, radiates into bilateral glutes Pain description: dull, ache, shooting down legs sometimes Aggravating factors: bending over, standing for too long Relieving factors: sitting in a chair with heating pad, Aleeve  PRECAUTIONS: None  WEIGHT BEARING RESTRICTIONS: No  FALLS:  Has patient fallen in last 6 months? No  LIVING ENVIRONMENT: Lives with: lives with their spouse Lives in: House/apartment Stairs: No Has following equipment at home: None  OCCUPATION: Retired  PLOF: Independent and Independent with basic ADLs  PATIENT GOALS: to get rid of my pain   OBJECTIVE:   DIAGNOSTIC FINDINGS:  Severe adjacent level stenosis L2-3 with left-sided disc protrusion with a small superior free fragment causing left leg pain.   PATIENT SURVEYS:  FOTO 39  SCREENING FOR RED FLAGS: Bowel or bladder incontinence: No Spinal tumors: No Cauda equina syndrome: No Compression fracture: No Abdominal aneurysm: No  COGNITION: Overall cognitive status: Within functional limits for tasks assessed     SENSATION: WFL  MUSCLE LENGTH: Hamstrings: bilateral tightness  POSTURE: rounded shoulders, decreased lumbar lordosis, and flexed trunk   PALPATION: TTP lumbar spine and mid back at sacrum   LUMBAR ROM:   AROM eval  Flexion Can touch toes but pain  Extension 75% with pain  Right lateral flexion 50% with pain  Left lateral flexion 50%  with pain  Right rotation WFL  Left rotation 75% with pain   (Blank rows = not tested)  LOWER EXTREMITY ROM:   grossly WFL   LOWER EXTREMITY MMT:  grossly 5/5   FUNCTIONAL TESTS:  5 times sit to stand: 18.76s  TODAY'S TREATMENT:                                                                                                                              DATE:  09/17/22 Nustep level 5 x 6 minutes STM to the left buttock in right sidelying Passive HS and piriformis stretch Feet on ball K2C, rotation, bridge and isometric abs Green tband clamshells Ball b/n knees squeeze MHP/IFC to the left buttock  09/15/22 Nustep level 5 x 6 minutes Seated row 15# 2x10 Lat pulls 15# 2x10 Feet on ball K2C,  rotation, small bridge and isometric abs Green tband clamshells Ball squeeze between knees bridges Passive HS and piriformis stretch 5# straight arm pulls cues for core and posture 5# AR press needed some help Standing hip abduction and extension x 10 each  09/11/22 NuStep L5 x 6 min Shoulder Ext 5lb 2x10 Standing Rows 5lb 2x10 S2S 2x5  Bridges 2x10 Feet on ball K2C, trunk rotation, small posterior chain isometric activation, isometric abs Passive stretch to the bilateral HS and piriformis, single K2C, lower trunk rotations  3/20/424 Bridges x10, Lower trunk rotations x10, hook lying clam red band x10 Passive stretch to the bilateral HS and piriformis NuStep L 4 x 6 min  Shoulder Ext & Rows Red 2x10 Hamstring curls 15lb  2x10   08/31/22 Feet on ball K2C, trunk rotation, small posterior chain isometric activation, isometric abs Passive stretch to the bilateral HS and piriformis Reviewed and performed HEP, had her do the clamshells in hooklying and told her to do small bridges, no pain, I also asked her to add piriformis stretch Nustep levl 5 x 6 minutes Gait outside around the parking island in the back, mild discomfort at the end  PATIENT EDUCATION:  Education details: POC and  HEP  Person educated: Patient Education method: Explanation Education comprehension: verbalized understanding  HOME EXERCISE PROGRAM: Access Code: [email or web address] URL: [email or web address] Date: 08/28/2022 Prepared by: Andris Baumann  Exercises - Supine Lower Trunk Rotation  - 1 x daily - 7 x weekly - 2 sets - 10 reps - Supine Bridge  - 1 x daily - 7 x weekly - 2 sets - 10 reps - Clamshell with Resistance  - 1 x daily - 7 x weekly - 2 sets - 10 reps - Seated Hamstring Stretch  - 2 x daily - 7 x weekly - 30 hold  ASSESSMENT:  CLINICAL IMPRESSION: Patient with increased pain after a busy day yesterday with it being her birthday.  She seemed to be a little more sore and guarded with motions.  She asked about a TENS, I gave her information about this and we tried the estim and she did like this  REHAB POTENTIAL: Fair    CLINICAL DECISION MAKING: Stable/uncomplicated  EVALUATION COMPLEXITY: Low   GOALS: Goals reviewed with patient? Yes  SHORT TERM GOALS: Target date: 09/25/22  Patient will be independent with initial HEP.  Goal status: met 09/17/22   LONG TERM GOALS: Target date: 10/23/22  Patient will be independent with advanced/ongoing HEP to improve outcomes and carryover.  Goal status:ongoing  2.  Patient will report 75% improvement in low back pain to improve QOL.  Baseline: 6/10  Goal status: progressing  3.  Patient will demonstrate full pain free lumbar ROM to perform ADLs.   Goal status:progressing  4.  Patient will demonstrate improved functional strength as demonstrated by <14 on 5xSTS. Baseline: 18.76s Goal status: INITIAL  5.  Patient will report 57 on lumbar FOTO to demonstrate improved functional ability.  Baseline: 39 Goal status: INITIAL   6.  Patient will tolerate 30 min of (standing/sitting/walking) to perform household chores. Baseline: 63mins Goal status:ongoing  PLAN:  PT FREQUENCY: 2x/week  PT DURATION: 8 weeks  PLANNED  INTERVENTIONS: Therapeutic exercises, Therapeutic activity, Neuromuscular re-education, Balance training, Gait training, Patient/Family education, Self Care, Joint mobilization, Stair training, Dry Needling, Electrical stimulation, Spinal mobilization, Cryotherapy, Moist heat, Traction, Ionotophoresis 4mg /ml Dexamethasone, and Manual therapy.  PLAN FOR NEXT SESSION: start light gym activities for low back mobility, stretching   Cove Haydon W, PT 09/17/2022, 8:43 AM

## 2022-09-21 ENCOUNTER — Other Ambulatory Visit (HOSPITAL_BASED_OUTPATIENT_CLINIC_OR_DEPARTMENT_OTHER): Payer: Self-pay | Admitting: Family Medicine

## 2022-09-21 DIAGNOSIS — Z1231 Encounter for screening mammogram for malignant neoplasm of breast: Secondary | ICD-10-CM

## 2022-09-22 ENCOUNTER — Encounter: Payer: Self-pay | Admitting: Physical Therapy

## 2022-09-22 ENCOUNTER — Ambulatory Visit (HOSPITAL_BASED_OUTPATIENT_CLINIC_OR_DEPARTMENT_OTHER)
Admission: RE | Admit: 2022-09-22 | Discharge: 2022-09-22 | Disposition: A | Discharge status: Home / Self Care | Payer: Medicare Other | Source: Ambulatory Visit | Attending: Family Medicine | Admitting: Family Medicine

## 2022-09-22 ENCOUNTER — Ambulatory Visit: Payer: Medicare Other | Attending: Neurological Surgery | Admitting: Physical Therapy

## 2022-09-22 ENCOUNTER — Encounter (HOSPITAL_BASED_OUTPATIENT_CLINIC_OR_DEPARTMENT_OTHER): Payer: Self-pay

## 2022-09-22 DIAGNOSIS — M6281 Muscle weakness (generalized): Secondary | ICD-10-CM | POA: Insufficient documentation

## 2022-09-22 DIAGNOSIS — M5416 Radiculopathy, lumbar region: Secondary | ICD-10-CM | POA: Insufficient documentation

## 2022-09-22 DIAGNOSIS — Z1231 Encounter for screening mammogram for malignant neoplasm of breast: Secondary | ICD-10-CM | POA: Diagnosis not present

## 2022-09-22 DIAGNOSIS — M5459 Other low back pain: Secondary | ICD-10-CM | POA: Insufficient documentation

## 2022-09-22 NOTE — Therapy (Signed)
OUTPATIENT PHYSICAL THERAPY THORACOLUMBAR TREATMENT   Patient Name: Kerry Coleman MRN: [id number] DOB:[birth year], 88 y.o., female Today's Date: 09/22/2022  END OF SESSION:  PT End of Session - 09/22/22 0848     Visit Number 7    Date for PT Re-Evaluation 10/23/22             History reviewed. No pertinent past medical history. Past Surgical History:  Procedure Laterality Date   BACK SURGERY     BREAST BIOPSY Left    @ age 35, Benign    BUNIONECTOMY     KNEE SURGERY     right carpal tunnel release     right index finger excision of mass     ROTATOR CUFF REPAIR     SPINE SURGERY     TONSILLECTOMY     Patient Active Problem List   Diagnosis Date Noted   Chronic left-sided low back pain 06/24/2022   Worries 09/18/2021   Stressful life events affecting family and household 06/17/2021   Urge incontinence 09/26/2020   Tremor of left hand 09/26/2020   Stress incontinence of urine 10/26/2019   Vitamin D deficiency 10/26/2019   Osteopenia of lumbar spine 05/08/2019   History of melanoma 04/27/2019   Encounter for health maintenance examination with abnormal findings 04/27/2019   Estrogen deficiency 04/27/2019   Urinary tract infection with hematuria 03/31/2019   Urinary tract infection without hematuria 03/31/2019   Abnormal urinalysis 03/23/2019   Elevated cholesterol 03/20/2019   Essential hypertension 03/20/2019    PCP: Abelino Derrick  REFERRING PROVIDER: Sherley Bounds  REFERRING DIAG:  Diagnosis  M54.16 (ICD-10-CM) - Radiculopathy, lumbar region    Rationale for Evaluation and Treatment: Rehabilitation  THERAPY DIAG:  Radiculopathy, lumbar region  Other low back pain  Muscle weakness (generalized)  ONSET DATE: "2 years ago"  SUBJECTIVE:                                                                                                                                                                                           SUBJECTIVE STATEMENT: "Better"  Had three bad days, Friday was ok, Saturday morning was really bad, hard time walking. Sunday was not good a lot of pain.  PERTINENT HISTORY:  2 back surgeries- one cyst removed, and L3-4 fusion surgery Bilateral TKA   PAIN:  Are you having pain? Yes: NPRS scale: 6/10 Pain location: mid low back, radiates into bilateral glutes Pain description: dull, ache, shooting down legs sometimes Aggravating factors: bending over, standing for too long Relieving factors: sitting in a chair with heating pad, Aleeve  PRECAUTIONS: None  WEIGHT BEARING RESTRICTIONS: No  FALLS:  Has patient fallen in last 6 months? No  LIVING ENVIRONMENT: Lives with: lives with their spouse Lives in: House/apartment Stairs: No Has following equipment at home: None  OCCUPATION: Retired  PLOF: Independent and Independent with basic ADLs  PATIENT GOALS: to get rid of my pain   OBJECTIVE:   DIAGNOSTIC FINDINGS:  Severe adjacent level stenosis L2-3 with left-sided disc protrusion with a small superior free fragment causing left leg pain.   PATIENT SURVEYS:  FOTO 39  SCREENING FOR RED FLAGS: Bowel or bladder incontinence: No Spinal tumors: No Cauda equina syndrome: No Compression fracture: No Abdominal aneurysm: No  COGNITION: Overall cognitive status: Within functional limits for tasks assessed     SENSATION: WFL  MUSCLE LENGTH: Hamstrings: bilateral tightness  POSTURE: rounded shoulders, decreased lumbar lordosis, and flexed trunk   PALPATION: TTP lumbar spine and mid back at sacrum   LUMBAR ROM:   AROM eval  Flexion Can touch toes but pain  Extension 75% with pain  Right lateral flexion 50% with pain  Left lateral flexion 50% with pain  Right rotation WFL  Left rotation 75% with pain   (Blank rows = not tested)  LOWER EXTREMITY ROM:   grossly WFL   LOWER EXTREMITY MMT:  grossly 5/5   FUNCTIONAL TESTS:  5 times sit to stand: 18.76s  TODAY'S TREATMENT:                                                                                                                               DATE:  09/22/22 Bike L1 x 3 some stabbing L hip pain NuStep L5 x 4 min  Passive HS, glute, LLE K2C, ITB, Lower trunk and piriformis stretch Feet on ball K2C, rotation, bridge and isometric abs Green tband clamshells Ball b/n knees squeeze STM to the left buttock in right sidelying with thera gun   09/17/22 Nustep level 5 x 6 minutes STM to the left buttock in right sidelying Passive HS and piriformis stretch Feet on ball K2C, rotation, bridge and isometric abs Green tband clamshells Ball b/n knees squeeze MHP/IFC to the left buttock  09/15/22 Nustep level 5 x 6 minutes Seated row 15# 2x10 Lat pulls 15# 2x10 Feet on ball K2C,  rotation, small bridge and isometric abs Green tband clamshells Ball squeeze between knees bridges Passive HS and piriformis stretch 5# straight arm pulls cues for core and posture 5# AR press needed some help Standing hip abduction and extension x 10 each  09/11/22 NuStep L5 x 6 min Shoulder Ext 5lb 2x10 Standing Rows 5lb 2x10 S2S 2x5  Bridges 2x10 Feet on ball K2C, trunk rotation, small posterior chain isometric activation, isometric abs Passive stretch to the bilateral HS and piriformis, single K2C, lower trunk rotations  3/20/424 Bridges x10, Lower trunk rotations x10, hook lying clam red band x10 Passive stretch to the bilateral HS and piriformis NuStep L 4 x 6 min  Shoulder Ext & Rows Red 2x10 Hamstring  curls 15lb 2x10   08/31/22 Feet on ball K2C, trunk rotation, small posterior chain isometric activation, isometric abs Passive stretch to the bilateral HS and piriformis Reviewed and performed HEP, had her do the clamshells in hooklying and told her to do small bridges, no pain, I also asked her to add piriformis stretch Nustep levl 5 x 6 minutes Gait outside around the parking island in the back, mild discomfort at the end  PATIENT  EDUCATION:  Education details: POC and HEP  Person educated: Patient Education method: Explanation Education comprehension: verbalized understanding  HOME EXERCISE PROGRAM: Access Code: [email or web address] URL: [email or web address] Date: 08/28/2022 Prepared by: Andris Baumann  Exercises - Supine Lower Trunk Rotation  - 1 x daily - 7 x weekly - 2 sets - 10 reps - Supine Bridge  - 1 x daily - 7 x weekly - 2 sets - 10 reps - Clamshell with Resistance  - 1 x daily - 7 x weekly - 2 sets - 10 reps - Seated Hamstring Stretch  - 2 x daily - 7 x weekly - 30 hold  ASSESSMENT:  CLINICAL IMPRESSION: Patient reports some increase pain over the weekend with no known reason. Refrain from TENS because pt thinks weekend pan may have came from that sine she use it at home after last session. Continues with a more passive session. Positive response to thera gun.   REHAB POTENTIAL: Fair    CLINICAL DECISION MAKING: Stable/uncomplicated  EVALUATION COMPLEXITY: Low   GOALS: Goals reviewed with patient? Yes  SHORT TERM GOALS: Target date: 09/25/22  Patient will be independent with initial HEP.  Goal status: met 09/17/22   LONG TERM GOALS: Target date: 10/23/22  Patient will be independent with advanced/ongoing HEP to improve outcomes and carryover.  Goal status:ongoing  2.  Patient will report 75% improvement in low back pain to improve QOL.  Baseline: 6/10  Goal status: progressing  3.  Patient will demonstrate full pain free lumbar ROM to perform ADLs.   Goal status:progressing  4.  Patient will demonstrate improved functional strength as demonstrated by <14 on 5xSTS. Baseline: 18.76s Goal status: INITIAL  5.  Patient will report 52 on lumbar FOTO to demonstrate improved functional ability.  Baseline: 39 Goal status: INITIAL   6.  Patient will tolerate 30 min of (standing/sitting/walking) to perform household chores. Baseline: 73mins Goal status:ongoing  PLAN:  PT FREQUENCY:  2x/week  PT DURATION: 8 weeks  PLANNED INTERVENTIONS: Therapeutic exercises, Therapeutic activity, Neuromuscular re-education, Balance training, Gait training, Patient/Family education, Self Care, Joint mobilization, Stair training, Dry Needling, Electrical stimulation, Spinal mobilization, Cryotherapy, Moist heat, Traction, Ionotophoresis 4mg /ml Dexamethasone, and Manual therapy.  PLAN FOR NEXT SESSION: start light gym activities for low back mobility, stretching   Scot Jun, PTA 09/22/2022, 8:48 AM

## 2022-09-24 ENCOUNTER — Encounter: Payer: Self-pay | Admitting: Physical Therapy

## 2022-09-24 ENCOUNTER — Ambulatory Visit: Payer: Medicare Other | Admitting: Physical Therapy

## 2022-09-24 DIAGNOSIS — M5416 Radiculopathy, lumbar region: Secondary | ICD-10-CM

## 2022-09-24 DIAGNOSIS — M6281 Muscle weakness (generalized): Secondary | ICD-10-CM

## 2022-09-24 DIAGNOSIS — M5459 Other low back pain: Secondary | ICD-10-CM

## 2022-09-24 NOTE — Therapy (Signed)
OUTPATIENT PHYSICAL THERAPY THORACOLUMBAR TREATMENT   Patient Name: Kerry Coleman MRN: [id number] DOB:07/15/1934, 88 y.o., female Today's Date: 09/24/2022  END OF SESSION:  PT End of Session - 09/24/22 0929     Visit Number 8    Date for PT Re-Evaluation 10/23/22    PT Start Time 0930    PT Stop Time 1015    PT Time Calculation (min) 45 min    Activity Tolerance Patient tolerated treatment well    Behavior During Therapy [facility] for tasks assessed/performed             History reviewed. No pertinent past medical history. Past Surgical History:  Procedure Laterality Date   BACK SURGERY     BREAST BIOPSY Left    @ age 26, Benign    BUNIONECTOMY     KNEE SURGERY     right carpal tunnel release     right index finger excision of mass     ROTATOR CUFF REPAIR     SPINE SURGERY     TONSILLECTOMY     Patient Active Problem List   Diagnosis Date Noted   Chronic left-sided low back pain 06/24/2022   Worries 09/18/2021   Stressful life events affecting family and household 06/17/2021   Urge incontinence 09/26/2020   Tremor of left hand 09/26/2020   Stress incontinence of urine 10/26/2019   Vitamin D deficiency 10/26/2019   Osteopenia of lumbar spine 05/08/2019   History of melanoma 04/27/2019   Encounter for health maintenance examination with abnormal findings 04/27/2019   Estrogen deficiency 04/27/2019   Urinary tract infection with hematuria 03/31/2019   Urinary tract infection without hematuria 03/31/2019   Abnormal urinalysis 03/23/2019   Elevated cholesterol 03/20/2019   Essential hypertension 03/20/2019    PCP: Abelino Derrick  REFERRING PROVIDER: Sherley Bounds  REFERRING DIAG:  Diagnosis  M54.16 (ICD-10-CM) - Radiculopathy, lumbar region    Rationale for Evaluation and Treatment: Rehabilitation  THERAPY DIAG:  Radiculopathy, lumbar region  Muscle weakness (generalized)  Other low back pain  ONSET DATE: "2 years ago"  SUBJECTIVE:                                                                                                                                                                                            SUBJECTIVE STATEMENT: "Achy across the low back"  PERTINENT HISTORY:  2 back surgeries- one cyst removed, and L3-4 fusion surgery Bilateral TKA   PAIN:  Are you having pain? Yes: NPRS scale: 6/10 Pain location: mid low back, radiates into bilateral glutes Pain description: dull, ache, shooting down legs sometimes Aggravating factors: bending over, standing for  too long Relieving factors: sitting in a chair with heating pad, Aleeve  PRECAUTIONS: None  WEIGHT BEARING RESTRICTIONS: No  FALLS:  Has patient fallen in last 6 months? No  LIVING ENVIRONMENT: Lives with: lives with their spouse Lives in: House/apartment Stairs: No Has following equipment at home: None  OCCUPATION: Retired  PLOF: Independent and Independent with basic ADLs  PATIENT GOALS: to get rid of my pain   OBJECTIVE:   DIAGNOSTIC FINDINGS:  Severe adjacent level stenosis L2-3 with left-sided disc protrusion with a small superior free fragment causing left leg pain.   PATIENT SURVEYS:  FOTO 39  SCREENING FOR RED FLAGS: Bowel or bladder incontinence: No Spinal tumors: No Cauda equina syndrome: No Compression fracture: No Abdominal aneurysm: No  COGNITION: Overall cognitive status: Within functional limits for tasks assessed     SENSATION: WFL  MUSCLE LENGTH: Hamstrings: bilateral tightness  POSTURE: rounded shoulders, decreased lumbar lordosis, and flexed trunk   PALPATION: TTP lumbar spine and mid back at sacrum   LUMBAR ROM:   AROM eval  Flexion Can touch toes but pain  Extension 75% with pain  Right lateral flexion 50% with pain  Left lateral flexion 50% with pain  Right rotation WFL  Left rotation 75% with pain   (Blank rows = not tested)  LOWER EXTREMITY ROM:   grossly WFL   LOWER EXTREMITY MMT:  grossly  5/5   FUNCTIONAL TESTS:  5 times sit to stand: 18.76s  TODAY'S TREATMENT:                                                                                                                              DATE:  09/24/22 NuStep L 5 x 6 min Gait 4 laps ~475ft  2LB WaTE overhead Ext 2x5 MHP to lumbar spine  Passive HS, glute, Single and double leg K2C, ITB, Lower trunk and piriformis stretch  Green tband clamshells Ball b/n knees squeeze Feet on ball K2C, rotation, bridge and isometric abs  09/22/22 Bike L1 x 3 some stabbing L hip pain NuStep L5 x 4 min  Passive HS, glute, LLE K2C, ITB, Lower trunk and piriformis stretch Feet on ball K2C, rotation, bridge and isometric abs Green tband clamshells Ball b/n knees squeeze STM to the left buttock in right sidelying with thera gun   09/17/22 Nustep level 5 x 6 minutes STM to the left buttock in right sidelying Passive HS and piriformis stretch Feet on ball K2C, rotation, bridge and isometric abs Green tband clamshells Ball b/n knees squeeze MHP/IFC to the left buttock  09/15/22 Nustep level 5 x 6 minutes Seated row 15# 2x10 Lat pulls 15# 2x10 Feet on ball K2C,  rotation, small bridge and isometric abs Green tband clamshells Ball squeeze between knees bridges Passive HS and piriformis stretch 5# straight arm pulls cues for core and posture 5# AR press needed some help Standing hip abduction and extension x 10 each  09/11/22 NuStep L5 x  6 min Shoulder Ext 5lb 2x10 Standing Rows 5lb 2x10 S2S 2x5  Bridges 2x10 Feet on ball K2C, trunk rotation, small posterior chain isometric activation, isometric abs Passive stretch to the bilateral HS and piriformis, single K2C, lower trunk rotations  3/20/424 Bridges x10, Lower trunk rotations x10, hook lying clam red band x10 Passive stretch to the bilateral HS and piriformis NuStep L 4 x 6 min  Shoulder Ext & Rows Red 2x10 Hamstring curls 15lb 2x10   08/31/22 Feet on ball K2C, trunk  rotation, small posterior chain isometric activation, isometric abs Passive stretch to the bilateral HS and piriformis Reviewed and performed HEP, had her do the clamshells in hooklying and told her to do small bridges, no pain, I also asked her to add piriformis stretch Nustep levl 5 x 6 minutes Gait outside around the parking island in the back, mild discomfort at the end  PATIENT EDUCATION:  Education details: POC and HEP  Person educated: Patient Education method: Explanation Education comprehension: verbalized understanding  HOME EXERCISE PROGRAM: Access Code: [email or web address] URL: [email or web address] Date: 08/28/2022 Prepared by: Andris Baumann  Exercises - Supine Lower Trunk Rotation  - 1 x daily - 7 x weekly - 2 sets - 10 reps - Supine Bridge  - 1 x daily - 7 x weekly - 2 sets - 10 reps - Clamshell with Resistance  - 1 x daily - 7 x weekly - 2 sets - 10 reps - Seated Hamstring Stretch  - 2 x daily - 7 x weekly - 30 hold  ASSESSMENT:  CLINICAL IMPRESSION: Again patient reports some increase pain to low back with no known reason. Pt reports that her back did loosen up some during gait. Discomfort reported with overhead ext. Pt stated she received a letter form her insurance about why her surgery was denied. Insurance said her nerved was pinched too much. Positive response to MHP. Lumbar and hip tightness with passive stretch   REHAB POTENTIAL: Fair    CLINICAL DECISION MAKING: Stable/uncomplicated  EVALUATION COMPLEXITY: Low   GOALS: Goals reviewed with patient? Yes  SHORT TERM GOALS: Target date: 09/25/22  Patient will be independent with initial HEP.  Goal status: met 09/17/22   LONG TERM GOALS: Target date: 10/23/22  Patient will be independent with advanced/ongoing HEP to improve outcomes and carryover.  Goal status:ongoing  2.  Patient will report 75% improvement in low back pain to improve QOL.  Baseline: 6/10  Goal status: progressing  3.  Patient  will demonstrate full pain free lumbar ROM to perform ADLs.   Goal status:progressing  4.  Patient will demonstrate improved functional strength as demonstrated by <14 on 5xSTS. Baseline: 18.76s Goal status: ongoing 44/24  5.  Patient will report 55 on lumbar FOTO to demonstrate improved functional ability.  Baseline: 39 Goal status: ongoing 09/24/22   6.  Patient will tolerate 30 min of (standing/sitting/walking) to perform household chores. Baseline: 52mins Goal status:ongoing  PLAN:  PT FREQUENCY: 2x/week  PT DURATION: 8 weeks  PLANNED INTERVENTIONS: Therapeutic exercises, Therapeutic activity, Neuromuscular re-education, Balance training, Gait training, Patient/Family education, Self Care, Joint mobilization, Stair training, Dry Needling, Electrical stimulation, Spinal mobilization, Cryotherapy, Moist heat, Traction, Ionotophoresis 4mg /ml Dexamethasone, and Manual therapy.  PLAN FOR NEXT SESSION: start light gym activities for low back mobility, stretching   Scot Jun, PTA 09/24/2022, 9:29 AM

## 2022-09-30 ENCOUNTER — Ambulatory Visit: Payer: Medicare Other | Admitting: Physical Therapy

## 2022-09-30 ENCOUNTER — Encounter: Payer: Self-pay | Admitting: Physical Therapy

## 2022-09-30 DIAGNOSIS — M5416 Radiculopathy, lumbar region: Secondary | ICD-10-CM

## 2022-09-30 DIAGNOSIS — M6281 Muscle weakness (generalized): Secondary | ICD-10-CM

## 2022-09-30 DIAGNOSIS — M5459 Other low back pain: Secondary | ICD-10-CM

## 2022-09-30 NOTE — Therapy (Signed)
OUTPATIENT PHYSICAL THERAPY THORACOLUMBAR TREATMENT   Patient Name: Kerry Coleman MRN: [id number] DOB:[birth year], 88 y.o., female Today's Date: 09/30/2022  END OF SESSION:  PT End of Session - 09/30/22 0930     Visit Number 9    Date for PT Re-Evaluation 10/23/22    PT Start Time 0930    PT Stop Time 1015    PT Time Calculation (min) 45 min    Activity Tolerance Patient tolerated treatment well    Behavior During Therapy [facility] for tasks assessed/performed             History reviewed. No pertinent past medical history. Past Surgical History:  Procedure Laterality Date   BACK SURGERY     BREAST BIOPSY Left    @ age 30, Benign    BUNIONECTOMY     KNEE SURGERY     right carpal tunnel release     right index finger excision of mass     ROTATOR CUFF REPAIR     SPINE SURGERY     TONSILLECTOMY     Patient Active Problem List   Diagnosis Date Noted   Chronic left-sided low back pain 06/24/2022   Worries 09/18/2021   Stressful life events affecting family and household 06/17/2021   Urge incontinence 09/26/2020   Tremor of left hand 09/26/2020   Stress incontinence of urine 10/26/2019   Vitamin D deficiency 10/26/2019   Osteopenia of lumbar spine 05/08/2019   History of melanoma 04/27/2019   Encounter for health maintenance examination with abnormal findings 04/27/2019   Estrogen deficiency 04/27/2019   Urinary tract infection with hematuria 03/31/2019   Urinary tract infection without hematuria 03/31/2019   Abnormal urinalysis 03/23/2019   Elevated cholesterol 03/20/2019   Essential hypertension 03/20/2019    PCP: Nadene Rubins  REFERRING PROVIDER: Marikay Alar  REFERRING DIAG:  Diagnosis  M54.16 (ICD-10-CM) - Radiculopathy, lumbar region    Rationale for Evaluation and Treatment: Rehabilitation  THERAPY DIAG:  Radiculopathy, lumbar region  Muscle weakness (generalized)  Other low back pain  ONSET DATE: "2 years ago"  SUBJECTIVE:                                                                                                                                                                                            SUBJECTIVE STATEMENT: "I had a brutal weekend" Friday, Saturday, and Sunday couldn't straighten up, walked hunched over" Pt reports pain that started in her back and went down the L leg.  PERTINENT HISTORY:  2 back surgeries- one cyst removed, and L3-4 fusion surgery Bilateral TKA   PAIN:  Are you having pain? Yes: NPRS scale:  6/10 Pain location: mid low back, radiates into bilateral glutes Pain description: dull, ache, shooting down legs sometimes Aggravating factors: bending over, standing for too long Relieving factors: sitting in a chair with heating pad, Aleeve  PRECAUTIONS: None  WEIGHT BEARING RESTRICTIONS: No  FALLS:  Has patient fallen in last 6 months? No  LIVING ENVIRONMENT: Lives with: lives with their spouse Lives in: House/apartment Stairs: No Has following equipment at home: None  OCCUPATION: Retired  PLOF: Independent and Independent with basic ADLs  PATIENT GOALS: to get rid of my pain   OBJECTIVE:   DIAGNOSTIC FINDINGS:  Severe adjacent level stenosis L2-3 with left-sided disc protrusion with a small superior free fragment causing left leg pain.   PATIENT SURVEYS:  FOTO 39  SCREENING FOR RED FLAGS: Bowel or bladder incontinence: No Spinal tumors: No Cauda equina syndrome: No Compression fracture: No Abdominal aneurysm: No  COGNITION: Overall cognitive status: Within functional limits for tasks assessed     SENSATION: WFL  MUSCLE LENGTH: Hamstrings: bilateral tightness  POSTURE: rounded shoulders, decreased lumbar lordosis, and flexed trunk   PALPATION: TTP lumbar spine and mid back at sacrum   LUMBAR ROM:   AROM eval  Flexion Can touch toes but pain  Extension 75% with pain  Right lateral flexion 50% with pain  Left lateral flexion 50% with pain  Right rotation WFL   Left rotation 75% with pain   (Blank rows = not tested)  LOWER EXTREMITY ROM:   grossly WFL   LOWER EXTREMITY MMT:  grossly 5/5   FUNCTIONAL TESTS:  5 times sit to stand: 18.76s  TODAY'S TREATMENT:                                                                                                                              DATE:  09/30/22 UBE L1 x 4 min NuStep L5 x 4 min LLE passive HS, Piriformis, and K2C STM to L piriformis area using Theragun MHP to lumbar spine x10 min  09/24/22 NuStep L 5 x 6 min Gait 4 laps ~4720ft  2LB WaTE overhead Ext 2x5 MHP to lumbar spine  Passive HS, glute, Single and double leg K2C, ITB, Lower trunk and piriformis stretch  Green tband clamshells Ball b/n knees squeeze Feet on ball K2C, rotation, bridge and isometric abs  09/22/22 Bike L1 x 3 some stabbing L hip pain NuStep L5 x 4 min  Passive HS, glute, LLE K2C, ITB, Lower trunk and piriformis stretch Feet on ball K2C, rotation, bridge and isometric abs Green tband clamshells Ball b/n knees squeeze STM to the left buttock in right sidelying with thera gun   09/17/22 Nustep level 5 x 6 minutes STM to the left buttock in right sidelying Passive HS and piriformis stretch Feet on ball K2C, rotation, bridge and isometric abs Green tband clamshells Ball b/n knees squeeze MHP/IFC to the left buttock  09/15/22 Nustep level 5 x 6 minutes Seated row 15# 2x10 Lat  pulls 15# 2x10 Feet on ball K2C,  rotation, small bridge and isometric abs Green tband clamshells Ball squeeze between knees bridges Passive HS and piriformis stretch 5# straight arm pulls cues for core and posture 5# AR press needed some help Standing hip abduction and extension x 10 each  09/11/22 NuStep L5 x 6 min Shoulder Ext 5lb 2x10 Standing Rows 5lb 2x10 S2S 2x5  Bridges 2x10 Feet on ball K2C, trunk rotation, small posterior chain isometric activation, isometric abs Passive stretch to the bilateral HS and piriformis,  single K2C, lower trunk rotations  3/20/424 Bridges x10, Lower trunk rotations x10, hook lying clam red band x10 Passive stretch to the bilateral HS and piriformis NuStep L 4 x 6 min  Shoulder Ext & Rows Red 2x10 Hamstring curls 15lb 2x10   08/31/22 Feet on ball K2C, trunk rotation, small posterior chain isometric activation, isometric abs Passive stretch to the bilateral HS and piriformis Reviewed and performed HEP, had her do the clamshells in hooklying and told her to do small bridges, no pain, I also asked her to add piriformis stretch Nustep levl 5 x 6 minutes Gait outside around the parking [place] in the back, mild discomfort at the end  PATIENT EDUCATION:  Education details: POC and HEP  Person educated: Patient Education method: Explanation Education comprehension: verbalized understanding  HOME EXERCISE PROGRAM: Access Code: [email or web address] URL: [email or web address] Date: 08/28/2022 Prepared by: Cassie Freer  Exercises - Supine Lower Trunk Rotation  - 1 x daily - 7 x weekly - 2 sets - 10 reps - Supine Bridge  - 1 x daily - 7 x weekly - 2 sets - 10 reps - Clamshell with Resistance  - 1 x daily - 7 x weekly - 2 sets - 10 reps - Seated Hamstring Stretch  - 2 x daily - 7 x weekly - 30 hold  ASSESSMENT:  CLINICAL IMPRESSION: Again patient reports some increase pain in low back what went down LLE. After NuStep pt slightly hunched over due to pain that went down LLE. All passive motions caused increase pain to low back area. Initially thera gun was helpful but stared to cause pain as it progressed. Positive response to MHP Apt. With neurosurgeon next week.   REHAB POTENTIAL: Fair    CLINICAL DECISION MAKING: Stable/uncomplicated  EVALUATION COMPLEXITY: Low   GOALS: Goals reviewed with patient? Yes  SHORT TERM GOALS: Target date: 09/25/22  Patient will be independent with initial HEP.  Goal status: met 09/17/22   LONG TERM GOALS: Target date:  10/23/22  Patient will be independent with advanced/ongoing HEP to improve outcomes and carryover.  Goal status:ongoing  2.  Patient will report 75% improvement in low back pain to improve QOL.  Baseline: 6/10  Goal status: progressing  3.  Patient will demonstrate full pain free lumbar ROM to perform ADLs.   Goal status:progressing  4.  Patient will demonstrate improved functional strength as demonstrated by <14 on 5xSTS. Baseline: 18.76s Goal status: ongoing 44/24  5.  Patient will report 58 on lumbar FOTO to demonstrate improved functional ability.  Baseline: 39 Goal status: ongoing 09/24/22   6.  Patient will tolerate 30 min of (standing/sitting/walking) to perform household chores. Baseline: Goal status:ongoing  PLAN:  PT FREQUENCY: 2x/week  PT DURATION: 8 weeks  PLANNED INTERVENTIONS: Therapeutic exercises, Therapeutic activity, Neuromuscular re-education, Balance training, Gait training, Patient/Family education, Self Care, Joint mobilization, Stair training, Dry Needling, Electrical stimulation, Spinal mobilization, Cryotherapy, Moist heat, Traction, Ionotophoresis 4mg /ml Dexamethasone, and Manual therapy.  PLAN FOR NEXT SESSION: start light gym activities for low back mobility, stretching   Grayce Sessions, PTA 09/30/2022, 9:30 AM

## 2022-10-02 ENCOUNTER — Encounter: Payer: Self-pay | Admitting: Physical Therapy

## 2022-10-02 ENCOUNTER — Ambulatory Visit: Payer: Medicare Other | Admitting: Physical Therapy

## 2022-10-02 DIAGNOSIS — M5416 Radiculopathy, lumbar region: Secondary | ICD-10-CM

## 2022-10-02 DIAGNOSIS — M6281 Muscle weakness (generalized): Secondary | ICD-10-CM

## 2022-10-02 DIAGNOSIS — M5459 Other low back pain: Secondary | ICD-10-CM

## 2022-10-02 NOTE — Therapy (Signed)
OUTPATIENT PHYSICAL THERAPY THORACOLUMBAR TREATMENT Progress Note Reporting Period 08/28/22 to 10/02/22  See note below for Objective Data and Assessment of Progress/Goals.      Patient Name: Kerry Coleman MRN: [id number] DOB:12-01-1934, 88 y.o., female Today's Date: 10/02/2022  END OF SESSION:  PT End of Session - 10/02/22 0844     Visit Number 10    Date for PT Re-Evaluation 10/23/22    PT Start Time 0845    PT Stop Time 0930    PT Time Calculation (min) 45 min    Activity Tolerance Patient tolerated treatment well    Behavior During Therapy [facility] for tasks assessed/performed             History reviewed. No pertinent past medical history. Past Surgical History:  Procedure Laterality Date   BACK SURGERY     BREAST BIOPSY Left    @ age 40, Benign    BUNIONECTOMY     KNEE SURGERY     right carpal tunnel release     right index finger excision of mass     ROTATOR CUFF REPAIR     SPINE SURGERY     TONSILLECTOMY     Patient Active Problem List   Diagnosis Date Noted   Chronic left-sided low back pain 06/24/2022   Worries 09/18/2021   Stressful life events affecting family and household 06/17/2021   Urge incontinence 09/26/2020   Tremor of left hand 09/26/2020   Stress incontinence of urine 10/26/2019   Vitamin D deficiency 10/26/2019   Osteopenia of lumbar spine 05/08/2019   History of melanoma 04/27/2019   Encounter for health maintenance examination with abnormal findings 04/27/2019   Estrogen deficiency 04/27/2019   Urinary tract infection with hematuria 03/31/2019   Urinary tract infection without hematuria 03/31/2019   Abnormal urinalysis 03/23/2019   Elevated cholesterol 03/20/2019   Essential hypertension 03/20/2019    PCP: Nadene Rubins  REFERRING PROVIDER: Marikay Alar  REFERRING DIAG:  Diagnosis  M54.16 (ICD-10-CM) - Radiculopathy, lumbar region    Rationale for Evaluation and Treatment: Rehabilitation  THERAPY DIAG:  Radiculopathy, lumbar  region  Muscle weakness (generalized)  Other low back pain  ONSET DATE: "2 years ago"  SUBJECTIVE:                                                                                                                                                                                           SUBJECTIVE STATEMENT: "Im better, I haven't done much, just sat ont he heating pad an used my TENS "  PERTINENT HISTORY:  2 back surgeries- one cyst removed, and L3-4 fusion surgery Bilateral TKA  PAIN:  Are you having pain? Yes: NPRS scale: 4/10 Pain location: mid low back, radiates into bilateral glutes Pain description: dull, ache, shooting down legs sometimes Aggravating factors: bending over, standing for too long Relieving factors: sitting in a chair with heating pad, Aleeve  PRECAUTIONS: None  WEIGHT BEARING RESTRICTIONS: No  FALLS:  Has patient fallen in last 6 months? No  LIVING ENVIRONMENT: Lives with: lives with their spouse Lives in: House/apartment Stairs: No Has following equipment at home: None  OCCUPATION: Retired  PLOF: Independent and Independent with basic ADLs  PATIENT GOALS: to get rid of my pain   OBJECTIVE:   DIAGNOSTIC FINDINGS:  Severe adjacent level stenosis L2-3 with left-sided disc protrusion with a small superior free fragment causing left leg pain.   PATIENT SURVEYS:  FOTO 39  SCREENING FOR RED FLAGS: Bowel or bladder incontinence: No Spinal tumors: No Cauda equina syndrome: No Compression fracture: No Abdominal aneurysm: No  COGNITION: Overall cognitive status: Within functional limits for tasks assessed     SENSATION: WFL  MUSCLE LENGTH: Hamstrings: bilateral tightness  POSTURE: rounded shoulders, decreased lumbar lordosis, and flexed trunk   PALPATION: TTP lumbar spine and mid back at sacrum   LUMBAR ROM:   AROM eval 10/02/22  Flexion Can touch toes but pain Full ROM but difficulty coming back up with pain  Extension 75% with  pain 50% limited with pain  Right lateral flexion 50% with pain 50% limited with pain  Left lateral flexion 50% with pain 50% limited with pain  Right rotation [facility] Limited 25% with pain  Left rotation 75% with pain 25% limited    (Blank rows = not tested)  LOWER EXTREMITY ROM:   grossly WFL   LOWER EXTREMITY MMT:  grossly 5/5   FUNCTIONAL TESTS:  5 times sit to stand: 18.76s  TODAY'S TREATMENT:                                                                                                                              DATE:  10/02/22 NuStep L 2 Limited ROM x6 min Checked ROM and Goals  MHP to lumbar spine  LLE passive HS, Piriformis, Glute, and K2C MHP additional 10 min   09/30/22 UBE L1 x 4 min NuStep L5 x 4 min LLE passive HS, Piriformis, and K2C STM to L piriformis area using Theragun MHP to lumbar spine x10 min  09/24/22 NuStep L 5 x 6 min Gait 4 laps ~476ft  2LB WaTE overhead Ext 2x5 MHP to lumbar spine  Passive HS, glute, Single and double leg K2C, ITB, Lower trunk and piriformis stretch  Green tband clamshells Ball b/n knees squeeze Feet on ball K2C, rotation, bridge and isometric abs  09/22/22 Bike L1 x 3 some stabbing L hip pain NuStep L5 x 4 min  Passive HS, glute, LLE K2C, ITB, Lower trunk and piriformis stretch Feet on ball K2C, rotation, bridge and isometric abs Green tband [employer] b/n  knees squeeze STM to the left buttock in right sidelying with thera gun   09/17/22 Nustep level 5 x 6 minutes STM to the left buttock in right sidelying Passive HS and piriformis stretch Feet on ball K2C, rotation, bridge and isometric abs Green tband clamshells Ball b/n knees squeeze MHP/IFC to the left buttock  09/15/22 Nustep level 5 x 6 minutes Seated row 15# 2x10 Lat pulls 15# 2x10 Feet on ball K2C,  rotation, small bridge and isometric abs Green tband clamshells Ball squeeze between knees bridges Passive HS and piriformis stretch 5# straight arm  pulls cues for core and posture 5# AR press needed some help Standing hip abduction and extension x 10 each  09/11/22 NuStep L5 x 6 min Shoulder Ext 5lb 2x10 Standing Rows 5lb 2x10 S2S 2x5  Bridges 2x10 Feet on ball K2C, trunk rotation, small posterior chain isometric activation, isometric abs Passive stretch to the bilateral HS and piriformis, single K2C, lower trunk rotations  3/20/424 Bridges x10, Lower trunk rotations x10, hook lying clam red band x10 Passive stretch to the bilateral HS and piriformis NuStep L 4 x 6 min  Shoulder Ext & Rows Red 2x10 Hamstring curls 15lb 2x10   08/31/22 Feet on ball K2C, trunk rotation, small posterior chain isometric activation, isometric abs Passive stretch to the bilateral HS and piriformis Reviewed and performed HEP, had her do the clamshells in hooklying and told her to do small bridges, no pain, I also asked her to add piriformis stretch Nustep levl 5 x 6 minutes Gait outside around the parking [place] in the back, mild discomfort at the end  PATIENT EDUCATION:  Education details: POC and HEP  Person educated: Patient Education method: Explanation Education comprehension: verbalized understanding  HOME EXERCISE PROGRAM: Access Code: [email or web address] URL: [email or web address] Date: 08/28/2022 Prepared by: Cassie Freer  Exercises - Supine Lower Trunk Rotation  - 1 x daily - 7 x weekly - 2 sets - 10 reps - Supine Bridge  - 1 x daily - 7 x weekly - 2 sets - 10 reps - Clamshell with Resistance  - 1 x daily - 7 x weekly - 2 sets - 10 reps - Seated Hamstring Stretch  - 2 x daily - 7 x weekly - 30 hold  ASSESSMENT:  CLINICAL IMPRESSION: Again patient reports some increase pain in low back what went down LLE. No functional improvement made towards goals. Slight increase in lumbar ROM in some directions. Most passive motions to the LLE cause increase Low back pain. Pt is currently in the process with her neurosurgeon to appeal  insurance that denied surgery. Will see neurosurgeon next week.   REHAB POTENTIAL: Fair    CLINICAL DECISION MAKING: Stable/uncomplicated  EVALUATION COMPLEXITY: Low   GOALS: Goals reviewed with patient? Yes  SHORT TERM GOALS: Target date: 09/25/22  Patient will be independent with initial HEP.  Goal status: met 09/17/22   LONG TERM GOALS: Target date: 10/23/22  Patient will be independent with advanced/ongoing HEP to improve outcomes and carryover.  Goal status:ongoing  2.  Patient will report 75% improvement in low back pain to improve QOL.  Baseline: 6/10  Goal status: Ongoing 10/02/22  3.  Patient will demonstrate full pain free lumbar ROM to perform ADLs.   Goal status:Ongoing 10/02/22  4.  Patient will demonstrate improved functional strength as demonstrated by <14 on 5xSTS. Baseline: 18.76s Goal status: 20.45 ongoing 10/02/22  5.  Patient will report 14 on lumbar FOTO to demonstrate improved functional ability.  Baseline:  39 Goal status: ongoing 10/02/22   6.  Patient will tolerate 30 min of (standing/sitting/walking) to perform household chores. Baseline: Goal status:ongoing  PLAN:  PT FREQUENCY: 2x/week  PT DURATION: 8 weeks  PLANNED INTERVENTIONS: Therapeutic exercises, Therapeutic activity, Neuromuscular re-education, Balance training, Gait training, Patient/Family education, Self Care, Joint mobilization, Stair training, Dry Needling, Electrical stimulation, Spinal mobilization, Cryotherapy, Moist heat, Traction, Ionotophoresis /ml Dexamethasone, and Manual therapy.  PLAN FOR NEXT SESSION: Get report from neurosurgeon   Cassie Freer, DPT Grayce Sessions, PTA 10/02/2022, 8:45 AM

## 2022-10-28 ENCOUNTER — Other Ambulatory Visit: Payer: Self-pay | Admitting: Family Medicine

## 2022-10-28 DIAGNOSIS — I10 Essential (primary) hypertension: Secondary | ICD-10-CM

## 2022-11-03 ENCOUNTER — Other Ambulatory Visit: Payer: Self-pay | Admitting: Family Medicine

## 2022-11-03 DIAGNOSIS — E7800 Pure hypercholesterolemia, unspecified: Secondary | ICD-10-CM

## 2022-12-23 ENCOUNTER — Ambulatory Visit (INDEPENDENT_AMBULATORY_CARE_PROVIDER_SITE_OTHER): Payer: Medicare Other | Admitting: Family Medicine

## 2022-12-23 ENCOUNTER — Encounter: Payer: Self-pay | Admitting: Family Medicine

## 2022-12-23 VITALS — BP 142/86 | HR 77 | Temp 96.6°F | Ht 65.0 in | Wt 180.6 lb

## 2022-12-23 DIAGNOSIS — I10 Essential (primary) hypertension: Secondary | ICD-10-CM

## 2022-12-23 DIAGNOSIS — E7800 Pure hypercholesterolemia, unspecified: Secondary | ICD-10-CM | POA: Diagnosis not present

## 2022-12-23 LAB — BASIC METABOLIC PANEL
BUN: 26 mg/dL — ABNORMAL HIGH (ref 6–23)
CO2: 32 mEq/L (ref 19–32)
Calcium: 10.1 mg/dL (ref 8.4–10.5)
Chloride: 102 mEq/L (ref 96–112)
Creatinine, Ser: 0.64 mg/dL (ref 0.40–1.20)
GFR: 80.11 mL/min (ref >60.00)
Glucose, Bld: 106 mg/dL — ABNORMAL HIGH (ref 70–99)
Potassium: 4.1 mEq/L (ref 3.5–5.1)
Sodium: 141 mEq/L (ref 135–145)

## 2022-12-23 LAB — LIPID PANEL
Cholesterol: 213 mg/dL — ABNORMAL HIGH (ref 0–200)
HDL: 78.8 mg/dL (ref >39.00)
LDL Cholesterol: 119 mg/dL — ABNORMAL HIGH (ref 0–99)
NonHDL: 134.19
Total CHOL/HDL Ratio: 3
Triglycerides: 74 mg/dL (ref 0.0–149.0)
VLDL: 14.8 mg/dL (ref 0.0–40.0)

## 2022-12-23 MED ORDER — METOPROLOL SUCCINATE ER 50 MG PO TB24: ORAL_TABLET | ORAL | 3 refills | Status: DC

## 2022-12-23 NOTE — Progress Notes (Signed)
Established Patient Office Visit   Subjective:  Patient ID: Kerry Coleman, female    DOB: [birth year]  Age: 88 y.o. MRN: [id number]  Chief Complaint  Patient presents with   Medical Management of Chronic Issues    6 months follow. Pt is fasting. Pt has back surgery in May still has some restrictions, but started swimming 3 weeks after her surgery.     HPI Encounter Diagnoses  Name Primary?   Essential hypertension Yes   Elevated cholesterol    White coat syndrome with diagnosis of hypertension    For follow-up of above.  No issues with rosuvastatin and maintains compliance.  Continues chlorthalidone and metoprolol for hypertension.  Says that BP at home runs in the 120s to 140s over 70s to 80s.  Feels stressed today.  Recovering well status post laminectomy back in May.  Is swimming and walking for exercise.   Review of Systems  Constitutional: Negative.   HENT: Negative.    Eyes:  Negative for blurred vision, discharge and redness.  Respiratory: Negative.    Cardiovascular: Negative.   Gastrointestinal:  Negative for abdominal pain.  Genitourinary: Negative.   Musculoskeletal: Negative.  Negative for myalgias.  Skin:  Negative for rash.  Neurological:  Negative for tingling, loss of consciousness and weakness.  Endo/Heme/Allergies:  Negative for polydipsia.     Current Outpatient Medications:    calcium carbonate (OS-CAL) 600 MG tablet, Take 1 tablet (600 mg total) by mouth 2 (two) times daily., Disp: 180 tablet, Rfl: 4   chlorthalidone (HYGROTON) 25 MG tablet, TAKE 1 TABLET(25 MG) BY MOUTH DAILY, Disp: 90 tablet, Rfl: 0   Cholecalciferol (VITAMIN D3 PO), Take 50 mg by mouth daily., Disp: , Rfl:    Multiple Vitamins-Minerals (CENTRUM SILVER 50+WOMEN PO), Centrum Silver Women, Disp: , Rfl:    naproxen sodium (ALEVE) 220 MG tablet, Take 220 mg by mouth as needed., Disp: , Rfl:    rosuvastatin (CRESTOR) 20 MG tablet, TAKE 1 TABLET(20 MG) BY MOUTH DAILY, Disp: 90 tablet, Rfl:  3   metoprolol succinate (TOPROL-XL) 50 MG 24 hr tablet, TAKE 1 TABLET(50 MG) BY MOUTH DAILY WITH OR IMMEDIATELY FOLLOWING A MEAL, Disp: 90 tablet, Rfl: 3   Objective:     BP (!) 142/86 Comment: Pt did not take Bpp meds this morning  Pulse 77   Temp (!) 96.6 F (35.9 C)   Ht 5\' 5"  (1.651 m)   Wt 180 lb 9.6 oz (81.9 kg)   SpO2 98%   BMI 30.05 kg/m  BP Readings from Last 3 Encounters:  12/23/22 (!) 142/86  06/24/22 134/76  12/19/21 134/72   Wt Readings from Last 3 Encounters:  12/23/22 180 lb 9.6 oz (81.9 kg)  08/10/22 178 lb (80.7 kg)  06/24/22 181 lb (82.1 kg)      Physical Exam Constitutional:      General: She is not in acute distress.    Appearance: Normal appearance. She is not ill-appearing, toxic-appearing or diaphoretic.  HENT:     Head: Normocephalic and atraumatic.     Right Ear: External ear normal.     Left Ear: External ear normal.  Eyes:     General: No scleral icterus.       Right eye: No discharge.        Left eye: No discharge.     Extraocular Movements: Extraocular movements intact.     Conjunctiva/sclera: Conjunctivae normal.  Cardiovascular:     Rate and Rhythm: Normal rate and regular rhythm.  Pulmonary:     Effort: Pulmonary effort is normal. No respiratory distress.     Breath sounds: Normal breath sounds. No wheezing, rhonchi or rales.  Skin:    General: Skin is warm and dry.  Neurological:     Mental Status: She is alert and oriented to person, place, and time.  Psychiatric:        Mood and Affect: Mood normal.        Behavior: Behavior normal.      No results found for any visits on 12/23/22.    The ASCVD Risk score (Arnett DK, et al., 2019) failed to calculate for the following reasons:   The 2019 ASCVD risk score is only valid for ages 65 to 73    Assessment & Plan:   Essential hypertension -     Basic metabolic panel -     Metoprolol Succinate ER; TAKE 1 TABLET(50 MG) BY MOUTH DAILY WITH OR IMMEDIATELY FOLLOWING A MEAL   Dispense: 90 tablet; Refill: 3  Elevated cholesterol -     Lipid panel  White coat syndrome with diagnosis of hypertension    Return in about 3 months (around 03/25/2023), or Bring home BP cuff with you next visit please.    Mliss Sax, MD

## 2023-01-03 ENCOUNTER — Other Ambulatory Visit: Payer: Self-pay | Admitting: Family Medicine

## 2023-01-03 DIAGNOSIS — I10 Essential (primary) hypertension: Secondary | ICD-10-CM

## 2023-02-05 ENCOUNTER — Telehealth: Payer: Self-pay | Admitting: Family Medicine

## 2023-02-05 NOTE — Telephone Encounter (Signed)
Patient dropped off document Handicap Placard, to be filled out by provider. Patient requested to send it back via Call Patient to pick up within 5-days. Document is located in providers tray at front office.Please advise at Mobile [phone] (mobile)  I put I dr box

## 2023-02-08 DIAGNOSIS — Z0279 Encounter for issue of other medical certificate: Secondary | ICD-10-CM

## 2023-02-10 ENCOUNTER — Telehealth: Payer: Self-pay | Admitting: Family Medicine

## 2023-02-10 NOTE — Telephone Encounter (Signed)
Pt called to check on her handi-cap pass she dropped off last week to be filled out

## 2023-02-16 ENCOUNTER — Other Ambulatory Visit: Payer: Self-pay | Admitting: Family Medicine

## 2023-02-16 DIAGNOSIS — I10 Essential (primary) hypertension: Secondary | ICD-10-CM

## 2023-02-18 NOTE — Telephone Encounter (Signed)
Pt called about this placard again. Please call when ready. [phone]

## 2023-03-09 ENCOUNTER — Ambulatory Visit (INDEPENDENT_AMBULATORY_CARE_PROVIDER_SITE_OTHER): Payer: Medicare Other

## 2023-03-09 DIAGNOSIS — Z23 Encounter for immunization: Secondary | ICD-10-CM | POA: Diagnosis not present

## 2023-03-25 ENCOUNTER — Encounter: Payer: Self-pay | Admitting: Family Medicine

## 2023-03-25 ENCOUNTER — Ambulatory Visit: Payer: Medicare Other | Admitting: Family Medicine

## 2023-03-25 VITALS — BP 146/80 | HR 81 | Wt 179.4 lb

## 2023-03-25 DIAGNOSIS — R7309 Other abnormal glucose: Secondary | ICD-10-CM

## 2023-03-25 DIAGNOSIS — E7800 Pure hypercholesterolemia, unspecified: Secondary | ICD-10-CM

## 2023-03-25 DIAGNOSIS — I10 Essential (primary) hypertension: Secondary | ICD-10-CM | POA: Diagnosis not present

## 2023-03-25 LAB — HEMOGLOBIN A1C: Hgb A1c MFr Bld: 5.8 % (ref 4.6–6.5)

## 2023-03-25 NOTE — Progress Notes (Signed)
Established Patient Office Visit   Subjective:  Patient ID: Kerry Coleman, female    DOB: [birth year]  Age: 88 y.o. MRN: [id number]  Chief Complaint  Patient presents with   Medical Management of Chronic Issues    Pt brought home BP cuff to compare.     HPI Encounter Diagnoses  Name Primary?   White coat syndrome with diagnosis of hypertension Yes   Elevated glucose    Elevated cholesterol    For follow-up of hypertension.  She brings in her home BP cuff today.  BP at home continues to run in the 130 over 70s range.  Historically has had elevated blood pressure readings when seeing the doctor.  Her cuff today is actually reading higher than what was measured by my nurse.  No history of diabetes.   Review of Systems  Constitutional: Negative.   HENT: Negative.    Eyes:  Negative for blurred vision, discharge and redness.  Respiratory: Negative.    Cardiovascular: Negative.   Gastrointestinal:  Negative for abdominal pain.  Genitourinary: Negative.   Musculoskeletal: Negative.  Negative for myalgias.  Skin:  Negative for rash.  Neurological:  Negative for tingling, loss of consciousness and weakness.  Endo/Heme/Allergies:  Negative for polydipsia.     Current Outpatient Medications:    calcium carbonate (OS-CAL) 600 MG tablet, Take 1 tablet (600 mg total) by mouth 2 (two) times daily., Disp: 180 tablet, Rfl: 4   chlorthalidone (HYGROTON) 25 MG tablet, TAKE 1 TABLET(25 MG) BY MOUTH DAILY, Disp: 90 tablet, Rfl: 0   Cholecalciferol (VITAMIN D3 PO), Take 50 mg by mouth daily., Disp: , Rfl:    metoprolol succinate (TOPROL-XL) 50 MG 24 hr tablet, TAKE 1 TABLET(50 MG) BY MOUTH DAILY WITH OR IMMEDIATELY FOLLOWING A MEAL, Disp: 90 tablet, Rfl: 3   Multiple Vitamins-Minerals (CENTRUM SILVER 50+WOMEN PO), Centrum Silver Women, Disp: , Rfl:    naproxen sodium (ALEVE) 220 MG tablet, Take 220 mg by mouth as needed., Disp: , Rfl:    rosuvastatin (CRESTOR) 20 MG tablet, TAKE 1 TABLET(20  MG) BY MOUTH DAILY, Disp: 90 tablet, Rfl: 3   Objective:     BP (!) 146/80   Pulse 81   Wt 179 lb 6.4 oz (81.4 kg)   SpO2 97%   BMI 29.85 kg/m  BP Readings from Last 3 Encounters:  03/25/23 (!) 146/80  12/23/22 (!) 142/86  06/24/22 134/76   Wt Readings from Last 3 Encounters:  03/25/23 179 lb 6.4 oz (81.4 kg)  12/23/22 180 lb 9.6 oz (81.9 kg)  08/10/22 178 lb (80.7 kg)      Physical Exam Constitutional:      General: She is not in acute distress.    Appearance: Normal appearance. She is not ill-appearing, toxic-appearing or diaphoretic.  HENT:     Head: Normocephalic and atraumatic.     Right Ear: External ear normal.     Left Ear: External ear normal.  Eyes:     General: No scleral icterus.       Right eye: No discharge.        Left eye: No discharge.     Extraocular Movements: Extraocular movements intact.     Conjunctiva/sclera: Conjunctivae normal.  Cardiovascular:     Rate and Rhythm: Normal rate and regular rhythm.  Pulmonary:     Effort: Pulmonary effort is normal. No respiratory distress.     Breath sounds: No wheezing or rhonchi.  Skin:    General: Skin is warm and dry.  Neurological:     Mental Status: She is alert and oriented to person, place, and time.  Psychiatric:        Mood and Affect: Mood normal.        Behavior: Behavior normal.   Labs appreciate   No results found for any visits on 03/25/23.    The ASCVD Risk score (Arnett DK, et al., 2019) failed to calculate for the following reasons:   The 2019 ASCVD risk score is only valid for ages 10 to 41    Assessment & Plan:   White coat syndrome with diagnosis of hypertension  Elevated glucose -     Hemoglobin A1c  Elevated cholesterol    Return in about 6 months (around 09/23/2023).  Patient is exercising by walking for 30 minutes daily.  She continues with weight loss journey.  Information was given on prediabetes and prediabetes eating plan.  Continue rosuvastatin.  Mliss Sax, MD

## 2023-05-03 ENCOUNTER — Other Ambulatory Visit: Payer: Self-pay | Admitting: Family Medicine

## 2023-05-03 DIAGNOSIS — I10 Essential (primary) hypertension: Secondary | ICD-10-CM

## 2023-07-02 ENCOUNTER — Telehealth: Payer: Self-pay | Admitting: Family Medicine

## 2023-07-02 NOTE — Telephone Encounter (Signed)
 error

## 2023-07-16 ENCOUNTER — Other Ambulatory Visit: Payer: Self-pay | Admitting: Family Medicine

## 2023-07-16 DIAGNOSIS — I10 Essential (primary) hypertension: Secondary | ICD-10-CM

## 2023-08-12 ENCOUNTER — Ambulatory Visit: Payer: Medicare Other

## 2023-08-12 DIAGNOSIS — Z0000 Encounter for general adult medical examination without abnormal findings: Secondary | ICD-10-CM

## 2023-08-12 NOTE — Patient Instructions (Signed)
Ms. Kerry Coleman , Thank you for taking time to come for your Medicare Wellness Visit. I appreciate your ongoing commitment to your health goals. Please review the following plan we discussed and let me know if I can assist you in the future.   Referrals/Orders/Follow-Ups/Clinician Recommendations: none  This is a list of the screening recommended for you and due dates:  Health Maintenance  Topic Date Due   DTaP/Tdap/Td vaccine (2 - Td or Tdap) 05/11/2024   Medicare Annual Wellness Visit  08/11/2024   Pneumonia Vaccine  Completed   Flu Shot  Completed   DEXA scan (bone density measurement)  Completed   Zoster (Shingles) Vaccine  Completed   HPV Vaccine  Aged Out   COVID-19 Vaccine  Discontinued    Advanced directives: (In Chart) A copy of your advanced directives are scanned into your chart should your provider ever need it.  Next Medicare Annual Wellness Visit scheduled for next year: Yes  insert Preventive Care attachment Insert FALL PREVENTION attachment if needed

## 2023-08-12 NOTE — Progress Notes (Addendum)
Subjective:   Kerry Coleman is a 89 y.o. who presents for a Medicare Wellness preventive visit.  Visit Complete: Virtual I connected with  Kerry Coleman on 08/12/23 by a audio enabled telemedicine application and verified that I am speaking with the correct person using two identifiers.  Patient Location: Home  Provider Location: Home Office  I discussed the limitations of evaluation and management by telemedicine. The patient expressed understanding and agreed to proceed.  Vital Signs: Because this visit was a virtual/telehealth visit, some criteria may be missing or patient reported. Any vitals not documented were not able to be obtained and vitals that have been documented are patient reported.  VideoError- Librarian, academic were attempted between this provider and patient, however failed, due to patient having technical difficulties OR patient did not have access to video capability.  We continued and completed visit with audio only.   AWV Questionnaire: No: Patient Medicare AWV questionnaire was not completed prior to this visit.  Cardiac Risk Factors include: advanced age (>3men, >63 women);hypertension     Objective:    Today's Vitals   08/12/23 1456  PainSc: 7    There is no height or weight on file to calculate BMI.     08/12/2023    3:07 PM 08/28/2022    7:54 AM 08/10/2022    9:21 AM 10/25/2019    8:12 AM  Advanced Directives  Does Patient Have a Medical Advance Directive? Yes Yes Yes Yes  Type of Estate agent of [place];Living will Healthcare Power of [place];Living will Healthcare Power of [place];Living will Healthcare Power of [place];Living will  Does patient want to make changes to medical advance directive?    No - Patient declined  Copy of Healthcare Power of Attorney in Chart? Yes - validated most recent copy scanned in chart (See row information) Yes - validated most recent copy scanned in chart (See row  information) Yes - validated most recent copy scanned in chart (See row information) No - copy requested    Current Medications (verified) Outpatient Encounter Medications as of 08/12/2023  Medication Sig   calcium carbonate (OS-CAL) 600 MG tablet Take 1 tablet (600 mg total) by mouth 2 (two) times daily.   chlorthalidone (HYGROTON) 25 MG tablet TAKE 1 TABLET(25 MG) BY MOUTH DAILY   Cholecalciferol (VITAMIN D3 PO) Take 50 mg by mouth daily.   metoprolol succinate (TOPROL-XL) 50 MG 24 hr tablet TAKE 1 TABLET(50 MG) BY MOUTH DAILY WITH OR IMMEDIATELY FOLLOWING A MEAL   Multiple Vitamins-Minerals (CENTRUM SILVER 50+WOMEN PO) Centrum Silver Women   naproxen sodium (ALEVE) 220 MG tablet Take 220 mg by mouth as needed.   rosuvastatin (CRESTOR) 20 MG tablet TAKE 1 TABLET(20 MG) BY MOUTH DAILY   No facility-administered encounter medications on file as of 08/12/2023.    Allergies (verified) Patient has no known allergies.   History: History reviewed. No pertinent past medical history. Past Surgical History:  Procedure Laterality Date   BACK SURGERY     BACK SURGERY     10/2022   BREAST BIOPSY Left    @ age 22, Benign    BUNIONECTOMY     KNEE SURGERY     right carpal tunnel release     right index finger excision of mass     ROTATOR CUFF REPAIR     SPINE SURGERY     TONSILLECTOMY     Family History  Problem Relation Age of Onset   Heart disease Mother  Stroke Mother    Arthritis Father    Heart disease Father    Hyperlipidemia Sister    Hypertension Sister    Arthritis Sister    Cancer Sister    Breast cancer Sister 35   Hypertension Son    Hyperlipidemia Son    Breast cancer Cousin    Tremor Neg Hx    Social History   Socioeconomic History   Marital status: Married    Spouse name: Not on file   Number of children: Not on file   Years of education: Not on file   Highest education level: Not on file  Occupational History   Not on file  Tobacco Use   Smoking  status: Never   Smokeless tobacco: Never  Vaping Use   Vaping status: Never Used  Substance and Sexual Activity   Alcohol use: Yes    Alcohol/week: 14.0 standard drinks of alcohol    Types: 14 Glasses of wine per week    Comment: 2 glass of red wine daily.   Drug use: Never   Sexual activity: Not on file  Other Topics Concern   Not on file  Social History Narrative   Not on file   Social Drivers of Health   Financial Resource Strain: Low Risk  (08/12/2023)   Overall Financial Resource Strain (CARDIA)    Difficulty of Paying Living Expenses: Not hard at all  Food Insecurity: No Food Insecurity (08/12/2023)   Hunger Vital Sign    Worried About Running Out of Food in the Last Year: Never true    Ran Out of Food in the Last Year: Never true  Transportation Needs: No Transportation Needs (08/12/2023)   PRAPARE - Administrator, Civil Service (Medical): No    Lack of Transportation (Non-Medical): No  Physical Activity: Inactive (08/12/2023)   Exercise Vital Sign    Days of Exercise per Week: 0 days    Minutes of Exercise per Session: 0 min  Stress: No Stress Concern Present (08/12/2023)   [employer] of Occupational Health - Occupational Stress Questionnaire    Feeling of Stress : Not at all  Social Connections: Socially Integrated (08/12/2023)   Social Connection and Isolation Panel [NHANES]    Frequency of Communication with Friends and Family: More than three times a week    Frequency of Social Gatherings with Friends and Family: Twice a week    Attends Religious Services: More than 4 times per year    Active Member of [employer] or Organizations: Yes    Attends Engineer, structural: More than 4 times per year    Marital Status: Married    Tobacco Counseling Counseling given: Not Answered    Clinical Intake:  Pre-visit preparation completed: Yes  Pain : 0-10 Pain Score: 7  Pain Type: Chronic pain Pain Location: Back Pain Orientation: Lower,  Left Pain Descriptors / Indicators: Radiating Pain Onset: More than a month ago Pain Frequency: Constant     Nutritional Risks: None Diabetes: No  How often do you need to have someone help you when you read instructions, pamphlets, or other written materials from your doctor or pharmacy?: 1 - Never  Interpreter Needed?: No  Information entered by :: Kerry Coleman   Activities of Daily Living     08/12/2023    2:58 PM  In your present state of health, do you have any difficulty performing the following activities:  Hearing? 0  Comment wears hearing aids  Vision? 0  Difficulty  concentrating or making decisions? 0  Walking or climbing stairs? 0  Dressing or bathing? 0  Doing errands, shopping? 0  Preparing Food and eating ? N  Using the Toilet? N  In the past six months, have you accidently leaked urine? Y  Comment wears a pad  Do you have problems with loss of bowel control? N  Managing your Medications? N  Managing your Finances? N  Housekeeping or managing your Housekeeping? N    Patient Care Team: Mliss Sax, MD as PCP - General (Family Medicine)  Indicate any recent Medical Services you may have received from other than [facility] providers in the past year (date may be approximate).     Assessment:   This is a routine wellness examination for Kerry Coleman.  Hearing/Vision screen Hearing Screening - Comments:: Has hearing aids that are maintained Vision Screening - Comments:: Regular eye exams, Dr. Carlynn Purl   Goals Addressed             This Visit's Progress    Patient Stated       08/12/2023, see what's wrong with back and get it taken care of       Depression Screen     08/12/2023    3:09 PM 12/23/2022    9:02 AM 08/10/2022    9:22 AM 06/24/2022    9:15 AM 12/19/2021   10:01 AM 09/18/2021   11:02 AM 06/17/2021    2:53 PM  PHQ 2/9 Scores  PHQ - 2 Score 0 0 0 0 0 0 0  PHQ- 9 Score 3          Fall Risk     08/12/2023    3:08 PM 12/23/2022    9:02 AM  08/10/2022    9:22 AM 06/24/2022    9:15 AM 12/19/2021   10:01 AM  Fall Risk   Falls in the past year? 0 0 0 0 0  Number falls in past yr: 0 0 0 0 0  Injury with Fall? 0 0 0 0   Risk for fall due to : Medication side effect No Fall Risks Medication side effect No Fall Risks   Follow up Falls prevention discussed;Falls evaluation completed Falls evaluation completed Falls prevention discussed;Education provided;Falls evaluation completed Falls evaluation completed     MEDICARE RISK AT HOME:  Medicare Risk at Home Any stairs in or around the home?: No If so, are there any without handrails?: No Home free of loose throw rugs in walkways, pet beds, electrical cords, etc?: Yes Adequate lighting in your home to reduce risk of falls?: Yes Life alert?: No Use of a cane, walker or w/c?: No Grab bars in the bathroom?: Yes Shower chair or bench in shower?: Yes Elevated toilet seat or a handicapped toilet?: Yes  TIMED UP AND GO:  Was the test performed?  No  Cognitive Function: 6CIT completed        08/12/2023    3:10 PM 08/10/2022    9:24 AM  6CIT Screen  What Year? 0 points 0 points  What month? 0 points 0 points  What time? 0 points 0 points  Count back from 20 0 points 0 points  Months in reverse 0 points 0 points  Repeat phrase 0 points 2 points  Total Score 0 points 2 points    Immunizations Immunization History  Administered Date(s) Administered   Fluad Quad(high Dose 65+) 03/07/2019, 03/05/2020, 03/10/2021, 03/12/2022   Fluad Trivalent(High Dose 65+) 03/09/2023   PFIZER(Purple Top)SARS-COV-2 Vaccination 07/31/2019, 08/25/2019,  03/28/2020   Pneumococcal Conjugate-13 04/16/2014   Pneumococcal Polysaccharide-23 06/25/1994, 06/26/2005   Tdap 05/11/2014   Unspecified SARS-COV-2 Vaccination 04/15/2023   Zoster Recombinant(Shingrix) 07/25/2017, 03/10/2018   Zoster, Live 04/06/2008    Screening Tests Health Maintenance  Topic Date Due   DTaP/Tdap/Td (2 - Td or Tdap)  05/11/2024   Medicare Annual Wellness (AWV)  08/11/2024   Pneumonia Vaccine 70+ Years old  Completed   INFLUENZA VACCINE  Completed   DEXA SCAN  Completed   Zoster Vaccines- Shingrix  Completed   HPV VACCINES  Aged Out   COVID-19 Vaccine  Discontinued    Health Maintenance  There are no preventive care reminders to display for this patient. Health Maintenance Items Addressed: Everything up to date  Additional Screening:  Vision Screening: Recommended annual ophthalmology exams for early detection of glaucoma and other disorders of the eye.  Dental Screening: Recommended annual dental exams for proper oral hygiene  Community Resource Referral / Chronic Care Management: CRR required this visit?  No   CCM required this visit?  No     Plan:     I have personally reviewed and noted the following in the patient's chart:   Medical and social history Use of alcohol, tobacco or illicit drugs  Current medications and supplements including opioid prescriptions. Patient is not currently taking opioid prescriptions. Functional ability and status Nutritional status Physical activity Advanced directives List of other physicians Hospitalizations, surgeries, and ER visits in previous 12 months Vitals Screenings to include cognitive, depression, and falls Referrals and appointments  In addition, I have reviewed and discussed with patient certain preventive protocols, quality metrics, and best practice recommendations. A written personalized care plan for preventive services as well as general preventive health recommendations were provided to patient.     Barb Merino, Coleman   1/61/0960   After Visit Summary: (MyChart) Due to this being a telephonic visit, the after visit summary with patients personalized plan was offered to patient via MyChart   Notes: Nothing significant to report at this time.

## 2023-08-16 ENCOUNTER — Telehealth: Payer: Self-pay | Admitting: Family Medicine

## 2023-08-16 NOTE — Telephone Encounter (Signed)
 Patient dropped off document Handicap Placard, to be filled out by provider. Patient requested to send it back via Call Patient to pick up within ASAP. Document is located in providers tray at front office.Please advise at [facility] [phone]  Her temporary placard expires this Friday 07/25/23.

## 2023-08-20 ENCOUNTER — Telehealth: Payer: Self-pay

## 2023-08-20 DIAGNOSIS — Z0279 Encounter for issue of other medical certificate: Secondary | ICD-10-CM

## 2023-08-20 NOTE — Telephone Encounter (Signed)
 Completed.

## 2023-09-06 ENCOUNTER — Other Ambulatory Visit: Payer: Self-pay | Admitting: Family Medicine

## 2023-09-06 ENCOUNTER — Encounter

## 2023-09-06 DIAGNOSIS — Z1231 Encounter for screening mammogram for malignant neoplasm of breast: Secondary | ICD-10-CM

## 2023-09-20 ENCOUNTER — Telehealth: Payer: Self-pay | Admitting: Family Medicine

## 2023-09-20 NOTE — Telephone Encounter (Signed)
 Patient dropped off document  Resident Medical Information , to be filled out by provider. Patient requested to send it back via Call Patient to pick up within 7-days. Document is located in providers tray at front office.Please advise at Mobile [phone] (mobile)

## 2023-09-24 ENCOUNTER — Ambulatory Visit: Payer: Medicare Other | Admitting: Family Medicine

## 2023-09-27 ENCOUNTER — Encounter: Payer: Self-pay | Admitting: Family Medicine

## 2023-09-27 ENCOUNTER — Ambulatory Visit (INDEPENDENT_AMBULATORY_CARE_PROVIDER_SITE_OTHER): Payer: Medicare Other | Admitting: Family Medicine

## 2023-09-27 VITALS — BP 128/82 | HR 98 | Temp 98.2°F | Ht 65.0 in | Wt 172.8 lb

## 2023-09-27 DIAGNOSIS — R7309 Other abnormal glucose: Secondary | ICD-10-CM

## 2023-09-27 DIAGNOSIS — I482 Chronic atrial fibrillation, unspecified: Secondary | ICD-10-CM | POA: Diagnosis not present

## 2023-09-27 DIAGNOSIS — I10 Essential (primary) hypertension: Secondary | ICD-10-CM

## 2023-09-27 DIAGNOSIS — E7800 Pure hypercholesterolemia, unspecified: Secondary | ICD-10-CM | POA: Diagnosis not present

## 2023-09-27 LAB — BASIC METABOLIC PANEL WITH GFR
BUN: 20 mg/dL (ref 6–23)
CO2: 31 meq/L (ref 19–32)
Calcium: 10.5 mg/dL (ref 8.4–10.5)
Chloride: 99 meq/L (ref 96–112)
Creatinine, Ser: 0.68 mg/dL (ref 0.40–1.20)
GFR: 78.52 mL/min (ref >60.00)
Glucose, Bld: 95 mg/dL (ref 70–99)
Potassium: 3.8 meq/L (ref 3.5–5.1)
Sodium: 140 meq/L (ref 135–145)

## 2023-09-27 LAB — LDL CHOLESTEROL, DIRECT: Direct LDL: 86 mg/dL

## 2023-09-27 LAB — HEMOGLOBIN A1C: Hgb A1c MFr Bld: 5.8 % (ref 4.6–6.5)

## 2023-09-27 NOTE — Progress Notes (Signed)
 Established Patient Office Visit   Subjective:  Patient ID: Kerry Coleman, female    DOB: [birth year]  Age: 89 y.o. MRN: [id number]  Chief Complaint  Patient presents with   Medical Management of Chronic Issues    6 month follow up. Pt is not fasting. Pt may be having another back surgery.     HPI Encounter Diagnoses  Name Primary?   Elevated glucose Yes   Elevated cholesterol    Essential hypertension    Chronic atrial fibrillation (HCC)    For follow-up of above.  Has been experiencing a fair amount of lower back pain.  It occasionally radiates down the side of her leg to her knee.  Surgery has been recommended.  She has been able to lose some weight.  Continues to consume 2 glasses of red wine daily.     Review of Systems  Constitutional: Negative.   HENT: Negative.    Eyes:  Negative for blurred vision, discharge and redness.  Respiratory: Negative.    Cardiovascular: Negative.   Gastrointestinal:  Negative for abdominal pain.  Genitourinary: Negative.   Musculoskeletal:  Positive for back pain. Negative for myalgias.  Skin:  Negative for rash.  Neurological:  Negative for tingling, loss of consciousness and weakness.  Endo/Heme/Allergies:  Negative for polydipsia.     Current Outpatient Medications:    calcium carbonate (OS-CAL) 600 MG tablet, Take 1 tablet (600 mg total) by mouth 2 (two) times daily., Disp: 180 tablet, Rfl: 4   chlorthalidone (HYGROTON) 25 MG tablet, TAKE 1 TABLET(25 MG) BY MOUTH DAILY, Disp: 90 tablet, Rfl: 0   Cholecalciferol (VITAMIN D3 PO), Take 50 mg by mouth daily., Disp: , Rfl:    metoprolol succinate (TOPROL-XL) 50 MG 24 hr tablet, TAKE 1 TABLET(50 MG) BY MOUTH DAILY WITH OR IMMEDIATELY FOLLOWING A MEAL, Disp: 90 tablet, Rfl: 3   Multiple Vitamins-Minerals (CENTRUM SILVER 50+WOMEN PO), Centrum Silver Women, Disp: , Rfl:    naproxen sodium (ALEVE) 220 MG tablet, Take 220 mg by mouth as needed., Disp: , Rfl:    rosuvastatin (CRESTOR) 20 MG  tablet, TAKE 1 TABLET(20 MG) BY MOUTH DAILY, Disp: 90 tablet, Rfl: 3   Objective:     BP 128/82 (BP Location: Left Arm, Patient Position: Sitting, Cuff Size: Normal)   Pulse 98   Temp 98.2 F (36.8 C) (Temporal)   Ht 5\' 5"  (1.651 m)   Wt 172 lb 12.8 oz (78.4 kg)   SpO2 98%   BMI 28.76 kg/m  BP Readings from Last 3 Encounters:  09/27/23 128/82  03/25/23 (!) 146/80  12/23/22 (!) 142/86   Wt Readings from Last 3 Encounters:  09/27/23 172 lb 12.8 oz (78.4 kg)  03/25/23 179 lb 6.4 oz (81.4 kg)  12/23/22 180 lb 9.6 oz (81.9 kg)      Physical Exam Constitutional:      General: She is not in acute distress.    Appearance: Normal appearance. She is not ill-appearing, toxic-appearing or diaphoretic.  HENT:     Head: Normocephalic and atraumatic.     Right Ear: External ear normal.     Left Ear: External ear normal.     Mouth/Throat:     Mouth: Mucous membranes are moist.     Pharynx: Oropharynx is clear. No oropharyngeal exudate or posterior oropharyngeal erythema.  Eyes:     General: No scleral icterus.       Right eye: No discharge.        Left eye: No discharge.  Extraocular Movements: Extraocular movements intact.     Conjunctiva/sclera: Conjunctivae normal.     Pupils: Pupils are equal, round, and reactive to light.  Cardiovascular:     Rate and Rhythm: Normal rate. Rhythm irregularly irregular.  Pulmonary:     Effort: Pulmonary effort is normal. No respiratory distress.     Breath sounds: Normal breath sounds.  Abdominal:     General: Bowel sounds are normal.     Tenderness: There is no abdominal tenderness. There is no guarding.  Musculoskeletal:     Cervical back: No rigidity or tenderness.  Skin:    General: Skin is warm and dry.  Neurological:     Mental Status: She is alert and oriented to person, place, and time.  Psychiatric:        Mood and Affect: Mood normal.        Behavior: Behavior normal.      No results found for any visits on  09/27/23.    The ASCVD Risk score (Arnett DK, et al., 2019) failed to calculate for the following reasons:   The 2019 ASCVD risk score is only valid for ages 40 to 72    Assessment & Plan:   Elevated glucose -     Basic metabolic panel with GFR -     Hemoglobin A1c  Elevated cholesterol -     LDL cholesterol, direct  Essential hypertension -     Basic metabolic panel with GFR -     EKG 12-Lead  Chronic atrial fibrillation (HCC) -     Amb Referral to AFIB Clinic    Return in about 8 weeks (around 11/22/2023).  Information was given on prediabetes and atrial fibrillation.  Will start a 325 mg aspirin daily.  She will moderate carbohydrate intake and continue with weight loss.  Cardiology consultation is pending.  EKG confirmed atrial fib and voltage requirements for LVH in aVL.  Discussed alcohol association with atrial fib.  Mliss Sax, MD

## 2023-09-28 ENCOUNTER — Encounter: Payer: Self-pay | Admitting: Family Medicine

## 2023-10-04 ENCOUNTER — Other Ambulatory Visit: Payer: Self-pay | Admitting: Neurological Surgery

## 2023-10-06 ENCOUNTER — Ambulatory Visit (HOSPITAL_COMMUNITY)
Admission: RE | Admit: 2023-10-06 | Discharge: 2023-10-06 | Disposition: A | Discharge status: Home / Self Care | Source: Ambulatory Visit | Attending: Physician Assistant | Admitting: Physician Assistant

## 2023-10-06 ENCOUNTER — Encounter (HOSPITAL_COMMUNITY): Payer: Self-pay | Admitting: Physician Assistant

## 2023-10-06 VITALS — BP 152/94 | HR 123 | Ht 65.0 in | Wt 171.2 lb

## 2023-10-06 DIAGNOSIS — I10 Essential (primary) hypertension: Secondary | ICD-10-CM | POA: Insufficient documentation

## 2023-10-06 DIAGNOSIS — D6869 Other thrombophilia: Secondary | ICD-10-CM | POA: Diagnosis not present

## 2023-10-06 DIAGNOSIS — E785 Hyperlipidemia, unspecified: Secondary | ICD-10-CM | POA: Diagnosis not present

## 2023-10-06 DIAGNOSIS — Z7901 Long term (current) use of anticoagulants: Secondary | ICD-10-CM | POA: Insufficient documentation

## 2023-10-06 DIAGNOSIS — I4819 Other persistent atrial fibrillation: Secondary | ICD-10-CM | POA: Insufficient documentation

## 2023-10-06 DIAGNOSIS — Z79899 Other long term (current) drug therapy: Secondary | ICD-10-CM | POA: Diagnosis not present

## 2023-10-06 HISTORY — DX: Essential (primary) hypertension: I10

## 2023-10-06 HISTORY — DX: Hyperlipidemia, unspecified: E78.5

## 2023-10-06 MED ORDER — METOPROLOL SUCCINATE ER 50 MG PO TB24: 50.0000 mg | ORAL_TABLET | Freq: Two times a day (BID) | ORAL | Status: DC

## 2023-10-06 MED ORDER — APIXABAN 5 MG PO TABS: 5.0000 mg | ORAL_TABLET | Freq: Two times a day (BID) | ORAL | 3 refills | Status: DC

## 2023-10-06 NOTE — Progress Notes (Signed)
 Primary Care Physician: Mliss Sax, MD Primary Cardiologist: None Electrophysiologist: None  Referring Physician: Dr Nell Range is a 89 y.o. female with a history of HLD, HTN, atrial fibrillation who presents for consultation in the [facility].  The patient was initially diagnosed with atrial fibrillation 09/27/23 after presenting to her PCP office for routine follow up. She was started on ASA at that time.  Today, patient remains in afib with elevated heart rates. She is unaware of her arrhythmia. Her primary concern is her back pain. She is scheduled with Dr Marikay Alar for back surgery on 10/22/23. She denies significant snoring. She has reduced her alcohol intake.   Today, she denies symptoms of palpitations, chest pain, shortness of breath, orthopnea, PND, lower extremity edema, dizziness, presyncope, syncope, snoring, daytime somnolence, bleeding, or neurologic sequela. The patient is tolerating medications without difficulties and is otherwise without complaint today.    Atrial Fibrillation Risk Factors:  she does not have symptoms or diagnosis of sleep apnea. she does not have a history of rheumatic fever. she does have a history of alcohol use. The patient does not have a history of early familial atrial fibrillation or other arrhythmias.  Atrial Fibrillation Management history:  Previous antiarrhythmic drugs: none Previous cardioversions: none Previous ablations: none Anticoagulation history: none  ROS- All systems are reviewed and negative except as per the HPI above.  Past Medical History:  Diagnosis Date   HLD (hyperlipidemia)    HTN (hypertension)     Current Outpatient Medications  Medication Sig Dispense Refill   calcium carbonate (OS-CAL) 600 MG tablet Take 1 tablet (600 mg total) by mouth 2 (two) times daily. 180 tablet 4   chlorthalidone (HYGROTON) 25 MG tablet TAKE 1 TABLET(25 MG) BY MOUTH DAILY 90 tablet  0   Cholecalciferol (VITAMIN D3 PO) Take 50 mg by mouth daily.     metoprolol succinate (TOPROL-XL) 50 MG 24 hr tablet TAKE 1 TABLET(50 MG) BY MOUTH DAILY WITH OR IMMEDIATELY FOLLOWING A MEAL 90 tablet 3   Multiple Vitamins-Minerals (CENTRUM SILVER 50+WOMEN PO) Take 1 tablet by mouth every morning.     rosuvastatin (CRESTOR) 20 MG tablet TAKE 1 TABLET(20 MG) BY MOUTH DAILY 90 tablet 3   naproxen sodium (ALEVE) 220 MG tablet Take 220 mg by mouth as needed. (Patient not taking: Reported on 10/06/2023)     No current facility-administered medications for this encounter.    Physical Exam: BP (!) 152/94   Pulse (!) 123   Ht 5\' 5"  (1.651 m)   Wt 77.7 kg   BMI 28.49 kg/m   GEN: Well nourished, well developed in no acute distress NECK: No JVD; No carotid bruits CARDIAC: Irregularly irregular rate and rhythm, no murmurs, rubs, gallops RESPIRATORY:  Clear to auscultation without rales, wheezing or rhonchi  ABDOMEN: Soft, non-tender, non-distended EXTREMITIES:  No edema; No deformity   Wt Readings from Last 3 Encounters:  10/06/23 77.7 kg  09/27/23 78.4 kg  03/25/23 81.4 kg     EKG today demonstrates  Afib with RVR Vent. rate 123 BPM PR interval * ms QRS duration 110 ms QT/QTcB 340/486 ms    CHA2DS2-VASc Score = 4  The patient's score is based upon: CHF History: 0 HTN History: 1 Diabetes History: 0 Stroke History: 0 Vascular Disease History: 0 Age Score: 2 Gender Score: 1       ASSESSMENT AND PLAN: Persistent Atrial Fibrillation (ICD10:  I48.19) The patient's CHA2DS2-VASc score is 4,  indicating a 4.8% annual risk of stroke.   General education about afib provided and questions answered. We also discussed her stroke risk and the risks and benefits of anticoagulation. Start Eliquis 5 mg BID (weight >60 kg, Cr <1.5). Stop ASA Check echocardiogram Increase Toprol to 50 mg BID Her back surgery will likely need to be delayed with her new diagnosis of afib with RVR. We  discussed DCCV to restore SR. Anticoagulation would not be able to be held for 4 weeks afterwards.   Secondary Hypercoagulable State (ICD10:  D68.69) The patient is at significant risk for stroke/thromboembolism based upon her CHA2DS2-VASc Score of 4.  Start Apixaban (Eliquis).   HTN Elevated today, increase Toprol as above.    Follow up in the AF clinic in 2 weeks. Will refer to establish care with a primary cardiologist.        Myrtha Ates PA-C [facility] [address] [place], [place] [address] [phone]

## 2023-10-06 NOTE — Patient Instructions (Addendum)
 STOP aspirin  START Eliquis 5mg  twice a day  INCREASE metoprolol to 50mg  twice a day    Echocardiogram will be scheduled after authorization from your insurance. Scheduling will call.

## 2023-10-08 ENCOUNTER — Other Ambulatory Visit (HOSPITAL_COMMUNITY): Payer: Self-pay

## 2023-10-08 DIAGNOSIS — I4891 Unspecified atrial fibrillation: Secondary | ICD-10-CM

## 2023-10-11 ENCOUNTER — Ambulatory Visit
Admission: RE | Admit: 2023-10-11 | Discharge: 2023-10-11 | Disposition: A | Discharge status: Home / Self Care | Source: Ambulatory Visit | Attending: Family Medicine | Admitting: Family Medicine

## 2023-10-11 DIAGNOSIS — Z1231 Encounter for screening mammogram for malignant neoplasm of breast: Secondary | ICD-10-CM

## 2023-10-14 ENCOUNTER — Ambulatory Visit (HOSPITAL_COMMUNITY)

## 2023-10-15 ENCOUNTER — Other Ambulatory Visit: Payer: Self-pay | Admitting: Family Medicine

## 2023-10-15 ENCOUNTER — Encounter: Payer: Self-pay | Admitting: Family Medicine

## 2023-10-15 DIAGNOSIS — R928 Other abnormal and inconclusive findings on diagnostic imaging of breast: Secondary | ICD-10-CM

## 2023-10-21 ENCOUNTER — Encounter (HOSPITAL_COMMUNITY): Payer: Self-pay | Admitting: Physician Assistant

## 2023-10-21 ENCOUNTER — Ambulatory Visit (HOSPITAL_COMMUNITY)
Admission: RE | Admit: 2023-10-21 | Discharge: 2023-10-21 | Disposition: A | Discharge status: Home / Self Care | Source: Ambulatory Visit | Attending: Physician Assistant | Admitting: Physician Assistant

## 2023-10-21 VITALS — BP 160/100 | HR 112 | Ht 65.0 in | Wt 168.4 lb

## 2023-10-21 DIAGNOSIS — I4819 Other persistent atrial fibrillation: Secondary | ICD-10-CM | POA: Insufficient documentation

## 2023-10-21 DIAGNOSIS — I10 Essential (primary) hypertension: Secondary | ICD-10-CM | POA: Diagnosis not present

## 2023-10-21 DIAGNOSIS — D6869 Other thrombophilia: Secondary | ICD-10-CM | POA: Diagnosis present

## 2023-10-21 NOTE — H&P (View-Only) (Signed)
 Primary Care Physician: Tonna Frederic, MD Primary Cardiologist: None Electrophysiologist: None  Referring Physician: Dr [name] is a 89 y.o. female with a history of HLD, HTN, atrial fibrillation who presents for follow up in the [facility]ibr[facility].  The patient was initially diagnosed with atrial fibrillation 09/27/23 after presenting to her PCP office for routine follow up. She was started on ASA at that time. At her AF clinic visit 4/16 she was started on Eliquis  and ASA discontinued.   Patient returns for follow up for atrial fibrillation. She remains in afib although her heart rate has improved. Her rates are typically 80-90s at home. Her BP readings are also 120s/80s at home. She denies any bleeding issues on anticoagulation. She remains unaware of her arrhythmia.   Today, she  denies symptoms of palpitations, chest pain, shortness of breath, orthopnea, PND, lower extremity edema, dizziness, presyncope, syncope, snoring, daytime somnolence, bleeding, or neurologic sequela. The patient is tolerating medications without difficulties and is otherwise without complaint today.    Atrial Fibrillation Risk Factors:  she does not have symptoms or diagnosis of sleep apnea. she does not have a history of rheumatic fever. she does have a history of alcohol use. The patient does not have a history of early familial atrial fibrillation or other arrhythmias.  Atrial Fibrillation Management history:  Previous antiarrhythmic drugs: none Previous cardioversions: none Previous ablations: none Anticoagulation history: Eliquis   ROS- All systems are reviewed and negative except as per the HPI above.  Past Medical History:  Diagnosis Date   HLD (hyperlipidemia)    HTN (hypertension)     Current Outpatient Medications  Medication Sig Dispense Refill   apixaban  (ELIQUIS ) 5 MG TABS tablet Take 1 tablet (5 mg total) by mouth 2 (two) times daily. 60  tablet 3   calcium  carbonate (OS-CAL) 600 MG tablet Take 1 tablet (600 mg total) by mouth 2 (two) times daily. 180 tablet 4   chlorthalidone  (HYGROTON ) 25 MG tablet TAKE 1 TABLET(25 MG) BY MOUTH DAILY 90 tablet 0   Cholecalciferol (VITAMIN D3 PO) Take 50 mg by mouth daily.     metoprolol  succinate (TOPROL -XL) 50 MG 24 hr tablet Take 1 tablet (50 mg total) by mouth 2 (two) times daily.     Multiple Vitamins-Minerals (CENTRUM SILVER 50+WOMEN PO) Take 1 tablet by mouth every morning.     naproxen sodium (ALEVE) 220 MG tablet Take 220 mg by mouth as needed.     rosuvastatin  (CRESTOR ) 20 MG tablet TAKE 1 TABLET(20 MG) BY MOUTH DAILY 90 tablet 3   No current facility-administered medications for this encounter.    Physical Exam: There were no vitals taken for this visit.  GEN: Well nourished, well developed in no acute distress NECK: No JVD CARDIAC: Irregularly irregular rate and rhythm, no murmurs, rubs, gallops RESPIRATORY:  Clear to auscultation without rales, wheezing or rhonchi  ABDOMEN: Soft, non-tender, non-distended EXTREMITIES:  No edema; No deformity    Wt Readings from Last 3 Encounters:  10/06/23 77.7 kg  09/27/23 78.4 kg  03/25/23 81.4 kg     EKG today demonstrates  Afib, LAFB Vent. rate 112 BPM PR interval * ms QRS duration 110 ms QT/QTcB 300/409 ms   CHA2DS2-VASc Score = 4  The patient's score is based upon: CHF History: 0 HTN History: 1 Diabetes History: 0 Stroke History: 0 Vascular Disease History: 0 Age Score: 2 Gender Score: 1       ASSESSMENT AND PLAN:  Persistent Atrial Fibrillation (ICD10:  I48.19) The patient's CHA2DS2-VASc score is 4, indicating a 4.8% annual risk of stroke.   Patient remains in persistent afib. We discussed rhythm control options. Will plan for DCCV after at least 3 weeks of uninterrupted anticoagulation.  Check cbc/bmet today.  Continue Toprol  50 mg BID Continue Eliquis  5 mg BID Echo scheduled for 5/16  Secondary  Hypercoagulable State (ICD10:  D68.69) The patient is at significant risk for stroke/thromboembolism based upon her CHA2DS2-VASc Score of 4.  Continue Apixaban  (Eliquis ).   HTN Elevated today, has been well controlled at home. Will reassess in SR, no changes today.    Follow up in the AF clinic post DCCV. Also referred to establish care with a primary cardiologist.    Informed Consent   Shared Decision Making/Informed Consent The risks (stroke, cardiac arrhythmias rarely resulting in the need for a temporary or permanent pacemaker, skin irritation or burns and complications associated with conscious sedation including aspiration, arrhythmia, respiratory failure and death), benefits (restoration of normal sinus rhythm) and alternatives of a direct current cardioversion were explained in detail to [name] and she agrees to proceed.        Myrtha Ates PA-C Af[facility] [address] [place], [place] [address] [phone]

## 2023-10-21 NOTE — Progress Notes (Signed)
 Primary Care Physician: Kerry Frederic, MD Primary Cardiologist: None Electrophysiologist: None  Referring Physician: Dr Kerry Coleman is a 89 y.o. female with a history of HLD, HTN, atrial fibrillation who presents for follow up in the [facility].  The patient was initially diagnosed with atrial fibrillation 09/27/23 after presenting to her PCP office for routine follow up. She was started on ASA at that time. At her AF clinic visit 4/16 she was started on Eliquis  and ASA discontinued.   Patient returns for follow up for atrial fibrillation. She remains in afib although her heart rate has improved. Her rates are typically 80-90s at home. Her BP readings are also 120s/80s at home. She denies any bleeding issues on anticoagulation. She remains unaware of her arrhythmia.   Today, she  denies symptoms of palpitations, chest pain, shortness of breath, orthopnea, PND, lower extremity edema, dizziness, presyncope, syncope, snoring, daytime somnolence, bleeding, or neurologic sequela. The patient is tolerating medications without difficulties and is otherwise without complaint today.    Atrial Fibrillation Risk Factors:  she does not have symptoms or diagnosis of sleep apnea. she does not have a history of rheumatic fever. she does have a history of alcohol use. The patient does not have a history of early familial atrial fibrillation or other arrhythmias.  Atrial Fibrillation Management history:  Previous antiarrhythmic drugs: none Previous cardioversions: none Previous ablations: none Anticoagulation history: Eliquis   ROS- All systems are reviewed and negative except as per the HPI above.  Past Medical History:  Diagnosis Date   HLD (hyperlipidemia)    HTN (hypertension)     Current Outpatient Medications  Medication Sig Dispense Refill   apixaban  (ELIQUIS ) 5 MG TABS tablet Take 1 tablet (5 mg total) by mouth 2 (two) times daily. 60  tablet 3   calcium  carbonate (OS-CAL) 600 MG tablet Take 1 tablet (600 mg total) by mouth 2 (two) times daily. 180 tablet 4   chlorthalidone  (HYGROTON ) 25 MG tablet TAKE 1 TABLET(25 MG) BY MOUTH DAILY 90 tablet 0   Cholecalciferol (VITAMIN D3 PO) Take 50 mg by mouth daily.     metoprolol  succinate (TOPROL -XL) 50 MG 24 hr tablet Take 1 tablet (50 mg total) by mouth 2 (two) times daily.     Multiple Vitamins-Minerals (CENTRUM SILVER 50+WOMEN PO) Take 1 tablet by mouth every morning.     naproxen sodium (ALEVE) 220 MG tablet Take 220 mg by mouth as needed.     rosuvastatin  (CRESTOR ) 20 MG tablet TAKE 1 TABLET(20 MG) BY MOUTH DAILY 90 tablet 3   No current facility-administered medications for this encounter.    Physical Exam: There were no vitals taken for this visit.  GEN: Well nourished, well developed in no acute distress NECK: No JVD CARDIAC: Irregularly irregular rate and rhythm, no murmurs, rubs, gallops RESPIRATORY:  Clear to auscultation without rales, wheezing or rhonchi  ABDOMEN: Soft, non-tender, non-distended EXTREMITIES:  No edema; No deformity    Wt Readings from Last 3 Encounters:  10/06/23 77.7 kg  09/27/23 78.4 kg  03/25/23 81.4 kg     EKG today demonstrates  Afib, LAFB Vent. rate 112 BPM PR interval * ms QRS duration 110 ms QT/QTcB 300/409 ms   CHA2DS2-VASc Score = 4  The patient's score is based upon: CHF History: 0 HTN History: 1 Diabetes History: 0 Stroke History: 0 Vascular Disease History: 0 Age Score: 2 Gender Score: 1       ASSESSMENT AND PLAN:  Persistent Atrial Fibrillation (ICD10:  I48.19) The patient's CHA2DS2-VASc score is 4, indicating a 4.8% annual risk of stroke.   Patient remains in persistent afib. We discussed rhythm control options. Will plan for DCCV after at least 3 weeks of uninterrupted anticoagulation.  Check cbc/bmet today.  Continue Toprol  50 mg BID Continue Eliquis  5 mg BID Echo scheduled for 5/16  Secondary  Hypercoagulable State (ICD10:  D68.69) The patient is at significant risk for stroke/thromboembolism based upon her CHA2DS2-VASc Score of 4.  Continue Apixaban  (Eliquis ).   HTN Elevated today, has been well controlled at home. Will reassess in SR, no changes today.    Follow up in the AF clinic post DCCV. Also referred to establish care with a primary cardiologist.    Informed Consent   Shared Decision Making/Informed Consent The risks (stroke, cardiac arrhythmias rarely resulting in the need for a temporary or permanent pacemaker, skin irritation or burns and complications associated with conscious sedation including aspiration, arrhythmia, respiratory failure and death), benefits (restoration of normal sinus rhythm) and alternatives of a direct current cardioversion were explained in detail to Kerry Coleman and she agrees to proceed.        Kerry Ates PA-C [facility] [address] [place], [place] [address] [phone]

## 2023-10-21 NOTE — Patient Instructions (Addendum)
 Cardioversion scheduled for: Friday, May 9th   - Arrive at the [facility] of [facility] ([address])  and check in with ADMITTING at 8:30am   - Do not eat or drink anything after midnight the night prior to your procedure.   - Take all your morning medication (except diabetic medications) with a sip of water prior to arrival.  - You will not be able to drive home after your procedure. Please ensure you have a responsible adult to drive you home. You will need someone with you for 24 hours post procedure.    - Do NOT miss any doses of your blood thinner - if you should miss a dose or take a dose more than 4 hours late -- please notify our office immediately.    - Expect to be in the procedural area approximately 2 hours.   - If you feel as if you go back into normal rhythm prior to scheduled cardioversion, please notify our office immediately.   If your procedure is canceled in the cardioversion suite you will be charged a cancellation fee.

## 2023-10-22 ENCOUNTER — Inpatient Hospital Stay (HOSPITAL_COMMUNITY): Admit: 2023-10-22 | Admitting: Neurological Surgery

## 2023-10-22 ENCOUNTER — Other Ambulatory Visit (HOSPITAL_COMMUNITY): Payer: Self-pay | Admitting: *Deleted

## 2023-10-22 LAB — CBC
Hematocrit: 44.2 % (ref 34.0–46.6)
Hemoglobin: 14.8 g/dL (ref 11.1–15.9)
MCH: 31.6 pg (ref 26.6–33.0)
MCHC: 33.5 g/dL (ref 31.5–35.7)
MCV: 94 fL (ref 79–97)
Platelets: 271 10*3/uL (ref 150–450)
RBC: 4.69 x10E6/uL (ref 3.77–5.28)
RDW: 11.9 % (ref 11.7–15.4)
WBC: 10.3 10*3/uL (ref 3.4–10.8)

## 2023-10-22 LAB — BASIC METABOLIC PANEL WITH GFR
BUN/Creatinine Ratio: 30 — ABNORMAL HIGH (ref 12–28)
BUN: 20 mg/dL (ref 8–27)
CO2: 18 mmol/L — ABNORMAL LOW (ref 20–29)
Calcium: 10.6 mg/dL — ABNORMAL HIGH (ref 8.7–10.3)
Chloride: 100 mmol/L (ref 96–106)
Creatinine, Ser: 0.66 mg/dL (ref 0.57–1.00)
Potassium: 3.4 mmol/L — ABNORMAL LOW (ref 3.5–5.2)
Sodium: 140 mmol/L (ref 134–144)
eGFR: 85 mL/min/{1.73_m2} (ref >59)

## 2023-10-22 SURGERY — LAMINECTOMY WITH POSTERIOR LATERAL ARTHRODESIS LEVEL 1
Anesthesia: General | Site: Back | Laterality: Left

## 2023-10-22 MED ORDER — POTASSIUM CHLORIDE ER 10 MEQ PO TBCR: 10.0000 meq | EXTENDED_RELEASE_TABLET | Freq: Every day | ORAL | 6 refills | Status: DC

## 2023-10-25 ENCOUNTER — Ambulatory Visit

## 2023-10-25 ENCOUNTER — Ambulatory Visit
Admission: RE | Admit: 2023-10-25 | Discharge: 2023-10-25 | Disposition: A | Discharge status: Home / Self Care | Source: Ambulatory Visit | Attending: Family Medicine | Admitting: Family Medicine

## 2023-10-25 DIAGNOSIS — R928 Other abnormal and inconclusive findings on diagnostic imaging of breast: Secondary | ICD-10-CM

## 2023-10-28 NOTE — Progress Notes (Signed)
 Pt called for pre procedure instructions.  Left message on machine with instructions.   Arrival time 0830 NPO after midnight explained Instructed to take am meds with sip of water and confirmed blood thinner consistency Instructed pt need for ride home tomorrow and have responsible adult with them for 24 hrs post procedure.[id number]

## 2023-10-29 ENCOUNTER — Encounter (HOSPITAL_COMMUNITY)
Admission: RE | Disposition: A | Discharge status: Home / Self Care | Payer: Self-pay | Source: Home / Self Care | Attending: Cardiovascular Disease

## 2023-10-29 ENCOUNTER — Ambulatory Visit (HOSPITAL_COMMUNITY): Admitting: Certified Registered"

## 2023-10-29 ENCOUNTER — Encounter (HOSPITAL_COMMUNITY): Payer: Self-pay | Admitting: Cardiovascular Disease

## 2023-10-29 ENCOUNTER — Ambulatory Visit (HOSPITAL_COMMUNITY)
Admission: RE | Admit: 2023-10-29 | Discharge: 2023-10-29 | Disposition: A | Discharge status: Home / Self Care | Attending: Cardiovascular Disease | Admitting: Cardiovascular Disease

## 2023-10-29 ENCOUNTER — Other Ambulatory Visit: Payer: Self-pay

## 2023-10-29 DIAGNOSIS — I10 Essential (primary) hypertension: Secondary | ICD-10-CM | POA: Insufficient documentation

## 2023-10-29 DIAGNOSIS — D6869 Other thrombophilia: Secondary | ICD-10-CM | POA: Diagnosis not present

## 2023-10-29 DIAGNOSIS — I4891 Unspecified atrial fibrillation: Secondary | ICD-10-CM | POA: Diagnosis not present

## 2023-10-29 DIAGNOSIS — R251 Tremor, unspecified: Secondary | ICD-10-CM | POA: Insufficient documentation

## 2023-10-29 DIAGNOSIS — I4819 Other persistent atrial fibrillation: Secondary | ICD-10-CM | POA: Diagnosis not present

## 2023-10-29 DIAGNOSIS — Z7901 Long term (current) use of anticoagulants: Secondary | ICD-10-CM | POA: Insufficient documentation

## 2023-10-29 DIAGNOSIS — Z79899 Other long term (current) drug therapy: Secondary | ICD-10-CM | POA: Diagnosis not present

## 2023-10-29 HISTORY — PX: CARDIOVERSION: EP1203

## 2023-10-29 LAB — POCT I-STAT, CHEM 8
BUN: 29 mg/dL — ABNORMAL HIGH (ref 8–23)
Calcium, Ion: 1.29 mmol/L (ref 1.15–1.40)
Chloride: 101 mmol/L (ref 98–111)
Creatinine, Ser: 0.8 mg/dL (ref 0.44–1.00)
Glucose, Bld: 115 mg/dL — ABNORMAL HIGH (ref 70–99)
HCT: 41 % (ref 36.0–46.0)
Hemoglobin: 13.9 g/dL (ref 12.0–15.0)
Potassium: 3.5 mmol/L (ref 3.5–5.1)
Sodium: 140 mmol/L (ref 135–145)
TCO2: 28 mmol/L (ref 22–32)

## 2023-10-29 SURGERY — CARDIOVERSION (CATH LAB)
Anesthesia: Monitor Anesthesia Care

## 2023-10-29 MED ORDER — PROPOFOL 10 MG/ML IV BOLUS
INTRAVENOUS | Status: DC | PRN
  Administered 2023-10-29: 50 mg via INTRAVENOUS

## 2023-10-29 MED ORDER — LIDOCAINE 2% (20 MG/ML) 5 ML SYRINGE
INTRAMUSCULAR | Status: DC | PRN
  Administered 2023-10-29: 40 mg via INTRAVENOUS

## 2023-10-29 SURGICAL SUPPLY — 1 items: PAD DEFIB RADIO PHYSIO CONN (PAD) ×1 IMPLANT

## 2023-10-29 NOTE — Transfer of Care (Signed)
 Immediate Anesthesia Transfer of Care Note  Patient: Kerry Coleman  Procedure(s) Performed: CARDIOVERSION  Patient Location: PACU  Anesthesia Type:MAC  Level of Consciousness: awake, alert , and oriented  Airway & Oxygen Therapy: Patient Spontanous Breathing and Patient connected to face mask oxygen  Post-op Assessment: Report given to RN, Post -op Vital signs reviewed and stable, and Patient moving all extremities X 4  Post vital signs: Reviewed and stable  Last Vitals:  Vitals Value Taken Time  BP    Temp    Pulse 87 10/29/23 0920  Resp 22 10/29/23 0920  SpO2 95 % 10/29/23 0920  Vitals shown include unfiled device data.  Last Pain:  Vitals:   10/29/23 0846  TempSrc: Temporal         Complications: No notable events documented.

## 2023-10-29 NOTE — Discharge Instructions (Signed)

## 2023-10-29 NOTE — Interval H&P Note (Signed)
 History and Physical Interval Note:  10/29/2023 9:12 AM  Kerry Coleman  has presented today for surgery, with the diagnosis of AFIB.  The various methods of treatment have been discussed with the patient and family. After consideration of risks, benefits and other options for treatment, the patient has consented to  Procedure(s): CARDIOVERSION (N/A) as a surgical intervention.  The patient's history has been reviewed, patient examined, no change in status, stable for surgery.  I have reviewed the patient's chart and labs.  Questions were answered to the patient's satisfaction.     [name], MD

## 2023-10-29 NOTE — CV Procedure (Signed)
 Electrical Cardioversion Procedure Note Kerry Coleman [id number] 24-Mar-1935  Procedure: Electrical Cardioversion Indications:  Atrial Fibrillation  Procedure Details Consent: Risks of procedure as well as the alternatives and risks of each were explained to the (patient/caregiver).  Consent for procedure obtained. Time Out: Verified patient identification, verified procedure, site/side was marked, verified correct patient position, special equipment/implants available, medications/allergies/relevent history reviewed, required imaging and test results available.  Performed  Patient placed on cardiac monitor, pulse oximetry, supplemental oxygen as necessary.  Sedation given: propofol Pacer pads placed anterior and posterior chest.  Cardioverted 1 time(s).  Cardioverted at 200J.  Evaluation Findings: Post procedure EKG shows: NSR Complications: None Patient did tolerate procedure well.   Kerry Sos, MD 10/29/2023, 9:27 AM

## 2023-10-29 NOTE — Anesthesia Preprocedure Evaluation (Addendum)
 Anesthesia Evaluation  Patient identified by MRN, date of birth, ID band Patient awake    Reviewed: Allergy & Precautions, NPO status , Patient's Chart, lab work & pertinent test results  Airway Mallampati: II  TM Distance: >3 FB Neck ROM: Full    Dental no notable dental hx.    Pulmonary neg pulmonary ROS   Pulmonary exam normal        Cardiovascular hypertension, Pt. on home beta blockers Normal cardiovascular exam     Neuro/Psych  PSYCHIATRIC DISORDERS      Tremor    GI/Hepatic negative GI ROS,,,(+)     substance abuse    Endo/Other  negative endocrine ROS    Renal/GU negative Renal ROS     Musculoskeletal negative musculoskeletal ROS (+)    Abdominal   Peds  Hematology  (+) Blood dyscrasia (Eliquis )   Anesthesia Other Findings A-FIB  Reproductive/Obstetrics                             Anesthesia Physical Anesthesia Plan  ASA: 3  Anesthesia Plan: MAC   Post-op Pain Management:    Induction:   PONV Risk Score and Plan: 2 and Propofol infusion and Treatment may vary due to age or medical condition  Airway Management Planned: Simple Face Mask  Additional Equipment:   Intra-op Plan:   Post-operative Plan:   Informed Consent: I have reviewed the patients History and Physical, chart, labs and discussed the procedure including the risks, benefits and alternatives for the proposed anesthesia with the patient or authorized representative who has indicated his/her understanding and acceptance.     Dental advisory given  Plan Discussed with: CRNA  Anesthesia Plan Comments:         Anesthesia Quick Evaluation

## 2023-10-30 ENCOUNTER — Encounter (HOSPITAL_COMMUNITY): Payer: Self-pay | Admitting: Cardiovascular Disease

## 2023-10-31 ENCOUNTER — Encounter (HOSPITAL_COMMUNITY): Payer: Self-pay | Admitting: Cardiovascular Disease

## 2023-10-31 NOTE — Anesthesia Postprocedure Evaluation (Signed)
 Anesthesia Post Note  Patient: Kerry Coleman  Procedure(s) Performed: CARDIOVERSION     Patient location during evaluation: Cath Lab Anesthesia Type: MAC Level of consciousness: awake Pain management: pain level controlled Vital Signs Assessment: post-procedure vital signs reviewed and stable Respiratory status: spontaneous breathing, nonlabored ventilation and respiratory function stable Cardiovascular status: blood pressure returned to baseline and stable Postop Assessment: no apparent nausea or vomiting Anesthetic complications: no   No notable events documented.  Last Vitals:  Vitals:   10/29/23 0943 10/29/23 0953  BP: 123/73 124/71  Pulse: 74 69  Resp: 20 11  Temp:    SpO2: 95% 97%    Last Pain:  Vitals:   10/29/23 0953  TempSrc:   PainSc: 0-No pain                 Anaiza Behrens P Constantin Hillery

## 2023-11-03 ENCOUNTER — Other Ambulatory Visit: Payer: Self-pay | Admitting: Family Medicine

## 2023-11-03 DIAGNOSIS — I10 Essential (primary) hypertension: Secondary | ICD-10-CM

## 2023-11-05 ENCOUNTER — Ambulatory Visit (HOSPITAL_COMMUNITY)
Admission: RE | Admit: 2023-11-05 | Discharge: 2023-11-05 | Disposition: A | Discharge status: Home / Self Care | Source: Ambulatory Visit | Attending: Physician Assistant | Admitting: Physician Assistant

## 2023-11-05 DIAGNOSIS — I081 Rheumatic disorders of both mitral and tricuspid valves: Secondary | ICD-10-CM | POA: Insufficient documentation

## 2023-11-05 DIAGNOSIS — I4891 Unspecified atrial fibrillation: Secondary | ICD-10-CM | POA: Diagnosis present

## 2023-11-05 DIAGNOSIS — I10 Essential (primary) hypertension: Secondary | ICD-10-CM | POA: Insufficient documentation

## 2023-11-05 LAB — ECHOCARDIOGRAM COMPLETE
AR max vel: 1.53 cm2
AV Area VTI: 1.72 cm2
AV Area mean vel: 1.31 cm2
AV Mean grad: 4 mmHg
AV Peak grad: 5.6 mmHg
Ao pk vel: 1.18 m/s
Area-P 1/2: 3.87 cm2
Calc EF: 67.1 %
MV VTI: 2.23 cm2
S' Lateral: 2.6 cm
Single Plane A2C EF: 60.2 %
Single Plane A4C EF: 72.5 %

## 2023-11-05 NOTE — Progress Notes (Signed)
  Echocardiogram 2D Echocardiogram has been performed.  Zannie Runkle L Ashely Joshua RDCS 11/05/2023, 11:32 AM

## 2023-11-08 ENCOUNTER — Ambulatory Visit (HOSPITAL_COMMUNITY): Payer: Self-pay | Admitting: Physician Assistant

## 2023-11-09 ENCOUNTER — Encounter: Payer: Self-pay | Admitting: Cardiology

## 2023-11-09 ENCOUNTER — Ambulatory Visit (HOSPITAL_COMMUNITY): Admitting: Physician Assistant

## 2023-11-09 ENCOUNTER — Ambulatory Visit (HOSPITAL_COMMUNITY): Attending: Cardiology | Admitting: Cardiology

## 2023-11-09 VITALS — BP 140/68 | HR 74 | Resp 16 | Ht 65.0 in | Wt 167.0 lb

## 2023-11-09 DIAGNOSIS — I4819 Other persistent atrial fibrillation: Secondary | ICD-10-CM | POA: Diagnosis present

## 2023-11-09 DIAGNOSIS — I10 Essential (primary) hypertension: Secondary | ICD-10-CM | POA: Diagnosis not present

## 2023-11-09 DIAGNOSIS — Z01810 Encounter for preprocedural cardiovascular examination: Secondary | ICD-10-CM | POA: Diagnosis not present

## 2023-11-09 MED ORDER — LOSARTAN POTASSIUM 25 MG PO TABS: 25.0000 mg | ORAL_TABLET | Freq: Every day | ORAL | 3 refills | Status: DC

## 2023-11-09 MED ORDER — METOPROLOL SUCCINATE ER 50 MG PO TB24: 50.0000 mg | ORAL_TABLET | Freq: Every day | ORAL | 3 refills | Status: AC

## 2023-11-09 NOTE — Patient Instructions (Signed)
 Medication Instructions:  DECREASE to Metoprolol  50 mg once daily  START Losartan 25 mg take one tablet by mouth daily   *If you need a refill on your cardiac medications before your next appointment, please call your pharmacy*  Lab Work: BMP in 1 week  If you have labs (blood work) drawn today and your tests are completely normal, you will receive your results only by: MyChart Message (if you have MyChart) OR A paper copy in the mail If you have any lab test that is abnormal or we need to change your treatment, we will call you to review the results.  Follow-Up: At [facility], you and your health needs are our priority.  As part of our continuing mission to provide you with exceptional heart care, our providers are all part of one team.  This team includes your primary Cardiologist (physician) and Advanced Practice Providers or APPs (Physician Assistants and Nurse Practitioners) who all work together to provide you with the care you need, when you need it.  Your next appointment:   6 month(s)  Provider:   Cody Das, MD

## 2023-11-09 NOTE — Progress Notes (Signed)
 Cardiology Office Note:  .   Date:  11/09/2023  ID:  Kerry Coleman, DOB 1934/08/30, MRN [id number] PCP: Tonna Frederic, MD  [facility] Providers Cardiologist:  Fransico Ivy, MD PCP: Tonna Frederic, MD  Chief Complaint  Patient presents with   Atrial Fibrillation     Kerry Coleman is a 89 y.o. female with hypertension, hyperlipidemia, persistent A-fib s/p cardioversion 10/29/2023  History of Present Illness  Patient was seen in A-fib clinic in April and May 2025 with A-fib.  She underwent successful cardioversion on 10/29/2023.  She is here to establish general cardiology care today.   Patient is here today with her husband and daughter-in-law.  She did not have any symptoms with A-fib, but was alerted by her Apple Watch, that led to incidental finding of A-fib.  She denies any chest pain, shortness of breath, palpitations.  Her biggest complaint at this time is back pain.  In fact, she was going to undergo spine surgery on 10/22/2023, which was postponed due to discovery of A-fib and need for cardioversion.  Before her back pain started, she was still able to walk up to 10,000 steps without any chest pain or shortness of breath.  She is compliant with her medical therapy.  Currently taking metoprolol  succinate 100 mg daily, Eliquis  milligram daily.     Vitals:   11/09/23 1536  BP: (!) 140/68  Pulse: 74  Resp: 16  SpO2: 95%      Review of Systems  Cardiovascular:  Negative for chest pain, dyspnea on exertion, leg swelling, palpitations and syncope.  Musculoskeletal:  Positive for back pain.  Neurological:  Positive for light-headedness (Occasional).        Studies Reviewed: Aaron Aas        EKG 11/09/2023: Sinus rhythm with marked sinus arrhythmia Left anterior fascicular block Moderate voltage criteria for LVH, may be normal variant ( R in aVL , Cornell product ) When compared with ECG of 21-Oct-2023 13:39, Sinus rhythm has replaced Atrial  fibrillation Vent. rate has decreased BY  43 BPM Minimal criteria for Septal infarct are no longer Present  Independently interpreted 10/2023: Cr 0.8   12/2022: Chol 213, TG 74, HDL 78, LDL 119  Echocardiogram 11/05/2023: 1. Left ventricular ejection fraction, by estimation, is 60 to 65%. The  left ventricle has normal function. The left ventricle has no regional  wall motion abnormalities. Left ventricular diastolic parameters were  normal.   2. Right ventricular systolic function is normal. The right ventricular  size is normal. There is normal pulmonary artery systolic pressure. The  estimated right ventricular systolic pressure is 26.8 mmHg.   3. Left atrial size was mildly dilated.   4. The mitral valve is normal in structure. Mild mitral valve  regurgitation. No evidence of mitral stenosis.   5. The aortic valve is normal in structure. Aortic valve regurgitation is  not visualized. No aortic stenosis is present.   6. The inferior vena cava is normal in size with greater than 50%  respiratory variability, suggesting right atrial pressure of 3 mmHg.   Risk Assessment/Calculations:    CHA2DS2-VASc Score = 4   This indicates a 4.8% annual risk of stroke. The patient's score is based upon: CHF History: 0 HTN History: 1 Diabetes History: 0 Stroke History: 0 Vascular Disease History: 0 Age Score: 2 Gender Score: 1     Physical Exam Vitals and nursing note reviewed.  Constitutional:      General: She is not in acute distress.  Neck:     Vascular: No JVD.  Cardiovascular:     Rate and Rhythm: Normal rate and regular rhythm.     Heart sounds: Normal heart sounds. No murmur heard. Pulmonary:     Effort: Pulmonary effort is normal.     Breath sounds: Normal breath sounds. No wheezing or rales.  Musculoskeletal:     Right lower leg: No edema.     Left lower leg: No edema.      VISIT DIAGNOSES:   ICD-10-CM   1. Persistent atrial fibrillation (HCC)  I48.19 EKG  12-Lead    2. Essential hypertension  I10 Basic metabolic panel with GFR    metoprolol  succinate (TOPROL -XL) 50 MG 24 hr tablet    Basic metabolic panel with GFR    3. Preop cardiovascular exam  Z01.810        Kerry Coleman is a 89 y.o. female with hypertension, hyperlipidemia, paroxysmal A-fib s/p cardioversion 10/29/2023 Assessment & Plan  Persistent A-fib: Maintaining sinus rhythm post cardioversion on 10/29/2023. Continue Eliquis  5 mg twice daily. Episodes of low blood pressure on metoprolol  succinate 100 mg daily.  Therefore, reduce metoprolol  succinate to 50 mg daily, and add losartan 25 mg daily as antihypertensive agent of choice in the setting of hypertension and A-fib.  Preop risk stratification: Anticipate upcoming spine surgery by Dr. Rochelle Chu. Excellent baseline functional capacity.  Structure currently normal heart on echocardiogram.  Low perioperative cardiac risk.  No stress testing necessary. Given her cardioversion on 10/29/2023, I recommend at least 4 to 6 weeks of uninterrupted anticoagulation post cardioversion.  Patient can proceed with spine surgery anytime after December 05, 2023. I would recommend holding Eliquis  for 2 days before spine surgery, resume day after surgery.  If Dr. Rochelle Chu would prefer, I am okay with Eliquis  interruption up to 3 days before the surgery.  Hypertension: Management as above.    Meds ordered this encounter  Medications   losartan (COZAAR) 25 MG tablet    Sig: Take 1 tablet (25 mg total) by mouth daily.    Dispense:  90 tablet    Refill:  3   metoprolol  succinate (TOPROL -XL) 50 MG 24 hr tablet    Sig: Take 1 tablet (50 mg total) by mouth daily.    Dispense:  90 tablet    Refill:  3     F/u in 6 months  Signed, Cody Das, MD

## 2023-11-10 ENCOUNTER — Other Ambulatory Visit: Payer: Self-pay | Admitting: Neurological Surgery

## 2023-11-17 ENCOUNTER — Ambulatory Visit: Payer: Self-pay | Admitting: Cardiology

## 2023-11-17 LAB — BASIC METABOLIC PANEL WITH GFR
BUN/Creatinine Ratio: 16 (ref 12–28)
BUN: 11 mg/dL (ref 8–27)
CO2: 25 mmol/L (ref 20–29)
Calcium: 10.8 mg/dL — ABNORMAL HIGH (ref 8.7–10.3)
Chloride: 99 mmol/L (ref 96–106)
Creatinine, Ser: 0.69 mg/dL (ref 0.57–1.00)
Glucose: 87 mg/dL (ref 70–99)
Potassium: 4.2 mmol/L (ref 3.5–5.2)
Sodium: 142 mmol/L (ref 134–144)
eGFR: 84 mL/min/{1.73_m2} (ref >59)

## 2023-11-22 ENCOUNTER — Telehealth (HOSPITAL_BASED_OUTPATIENT_CLINIC_OR_DEPARTMENT_OTHER): Payer: Self-pay | Admitting: *Deleted

## 2023-11-22 NOTE — Telephone Encounter (Signed)
   Pre-operative Risk Assessment    Patient Name: Kerry Coleman  DOB: 1934/07/30 MRN: [id number]   Date of last office visit: 11/09/2023 Date of next office visit: None  Request for Surgical Clearance    Procedure:  L2-3 Posterior Lateral Fusion/Hemifacectomy  Date of Surgery:  Clearance 12/27/23                                 Surgeon:  Ottie Blonder Surgeon's Group or Practice Name:  [facility] Phone number:  [phone] Fax number:  [phone]   Type of Clearance Requested:   - Medical  - Pharmacy:  Hold Apixaban  (Eliquis ) Not Indicated   Type of Anesthesia:  General    Additional requests/questions:    Signed, Lauris Port   11/22/2023, 3:37 PM

## 2023-11-23 NOTE — Telephone Encounter (Signed)
 Patient with diagnosis of afib on Eliquis  for anticoagulation.    Procedure:  L2-3 Posterior Lateral Fusion/Hemifacectomy  Date of procedure: 12/27/2023   CHA2DS2-VASc Score = 4   This indicates a 4.8% annual risk of stroke. The patient's score is based upon: CHF History: 0 HTN History: 1 Diabetes History: 0 Stroke History: 0 Vascular Disease History: 0 Age Score: 2 Gender Score: 1    CrCl 69 mL/min Platelet count 271 K  Cardioversion Date: 10/29/2023 Uninterrupted anticoagulation is recommended for at least 4 weeks post-cardioversion, through 11/26/2023, to reduce the risk of thromboembolic events.  Per office protocol, patient can hold Eliquis  for 3 days prior to procedure.     **This guidance is not considered finalized until pre-operative APP has relayed final recommendations.**

## 2023-11-24 NOTE — Telephone Encounter (Signed)
   Patient Name: Kerry Coleman  DOB: 11-22-1934 MRN: [id number]  Primary Cardiologist: Cody Das, MD  Chart reviewed as part of pre-operative protocol coverage. Given past medical history and time since last visit, based on ACC/AHA guidelines, Tiffny Gemmer is at acceptable risk for the planned procedure without further cardiovascular testing.   The patient was advised that if she develops new symptoms prior to surgery to contact our office to arrange for a follow-up visit, and she verbalized understanding.  Uninterrupted anticoagulation is recommended for at least 4 weeks post-cardioversion, through 11/26/2023, to reduce the risk of thromboembolic events.   Per office protocol, patient can hold Eliquis  for 3 days prior to procedure.    I will route this recommendation to the requesting party via Epic fax function and remove from pre-op pool.  Please call with questions.  Francene Ing, Retha Cast, NP 11/24/2023, 8:05 AM

## 2023-11-25 ENCOUNTER — Telehealth: Payer: Self-pay | Admitting: Cardiology

## 2023-11-25 MED ORDER — DILTIAZEM HCL ER COATED BEADS 120 MG PO CP24: 120.0000 mg | ORAL_CAPSULE | Freq: Every day | ORAL | 1 refills | Status: DC

## 2023-11-25 NOTE — Telephone Encounter (Signed)
 Patient c/o Palpitations:  STAT if patient reporting lightheadedness, shortness of breath, or chest pain  How long have you had palpitations/irregular HR/ Afib? Are you having the symptoms now? afib  Are you currently experiencing lightheadedness, SOB or CP? Just weakness  Do you have a history of afib (atrial fibrillation) or irregular heart rhythm? afib  Have you checked your BP or HR? (document readings if available): yesterday 123/81 HR 105, today 113/58 HR 95  Are you experiencing any other symptoms? No. Pt was seen at pcp office this morning and was advised that she was in afib

## 2023-11-25 NOTE — Telephone Encounter (Signed)
 Pt called to report that she was at her PCP office this morning at her husbands appt and the MD listened to her heart while she was there and told her that she was in Afib... no EKG.   She says she feels well but her pulse monitor at home notes that her HR has been "jumping" around but not getting above 110.   Her BP has been:  123/81 HR 105, today 113/58 HR 95   She denies SOB, dizziness, just feels tired.   She is worried that she has back surgery planned for 12/26/23...   I advised her to continue to monitor and to let us  know if she has any change in her symptoms.   She will be sure to take her meds daily.   I will send to Dr Filiberto Hug for his review.

## 2023-11-25 NOTE — Telephone Encounter (Signed)
 Known paroxysmal A-fib.  It is possible that she has gone back into A-fib after successful cardioversion earlier.  At this point, I would recommend rate control approach without repeat attempt for cardioversion, as that would further delay her back surgery.  As long as rate is controlled, she should be able to undergo surgery as planned.  To help with rate control, we could either go back on metoprolol  to 100 mg daily, or keep metoprolol  at 50 mg daily and add diltiazem 120 mg daily.  Hope that helps.  Thanks MJP

## 2023-11-25 NOTE — Telephone Encounter (Signed)
 Spoke to patient and she advised that she has been having blood pressure and heart rates ranging as follows:   123/81   HR 105 124/73   HR 90's 93/62     HR 102 (Pt symptomatic with weakness and "woozy")  Confirmed with patient that she is taking all medications as prescribed. Pt is taking her Eliquis . Pt agreed with continuing metoprolol  50 mg and starting diltiazem CD 120 MG. Prescription has been sent in and pt reports that she will pick up the prescription tomorrow. Advised patient to call on Monday to advise how she is feeling and advised heart rates/ blood pressures. Pt agrees with plan of care.

## 2023-11-26 NOTE — Telephone Encounter (Signed)
 Agree. We can do a visit with me or APP in the next week if symptoms no better.  Thanks MJP

## 2023-11-30 NOTE — Telephone Encounter (Signed)
 Spoke to patient Dr.[name]'s advice given.

## 2023-11-30 NOTE — Telephone Encounter (Signed)
 Glad to hear diltiazem  is working. I do not anticipate any other immediate medication changes.  Thanks MJP

## 2023-11-30 NOTE — Telephone Encounter (Signed)
 Spoke to patient she stated Dr.[name] wanted her to call back to report symptoms and B/P.Stated she started taking Diltiazem  120 mg every morning this past Saturday 6/7.Stated afib has settled down.Pulse today 84 B/P 112/63.Stated she is very weak,no energy.She wanted to ask Dr.[name] if she needs to decrease any medication.She is still in a lot of back pain.She does not take any pain med.She uses ice and heat to help pain.Advised I will send message to Dr.[name] for advice.

## 2023-11-30 NOTE — Telephone Encounter (Signed)
 Patient is requesting to speak with RN Ruthann Cover.

## 2023-12-09 ENCOUNTER — Ambulatory Visit

## 2023-12-10 ENCOUNTER — Ambulatory Visit (INDEPENDENT_AMBULATORY_CARE_PROVIDER_SITE_OTHER): Admitting: Family Medicine

## 2023-12-10 ENCOUNTER — Encounter: Payer: Self-pay | Admitting: Family Medicine

## 2023-12-10 VITALS — BP 132/84 | HR 99 | Temp 96.5°F | Ht 65.0 in | Wt 164.6 lb

## 2023-12-10 DIAGNOSIS — I482 Chronic atrial fibrillation, unspecified: Secondary | ICD-10-CM | POA: Insufficient documentation

## 2023-12-10 DIAGNOSIS — E7800 Pure hypercholesterolemia, unspecified: Secondary | ICD-10-CM

## 2023-12-10 DIAGNOSIS — I10 Essential (primary) hypertension: Secondary | ICD-10-CM | POA: Diagnosis not present

## 2023-12-10 DIAGNOSIS — R7303 Prediabetes: Secondary | ICD-10-CM | POA: Diagnosis not present

## 2023-12-10 NOTE — Progress Notes (Signed)
 Established Patient Office Visit   Subjective:  Patient ID: Kerry Coleman, female    DOB: 1934/11/12  Age: 89 y.o. MRN: [id number]  Chief Complaint  Patient presents with   Medical Management of Chronic Issues    8 week follow up. Afib. Pt states she did see a cardiologist and was placed on Eliquis . PT did not start Asprin daily.     HPI Encounter Diagnoses  Name Primary?   Elevated cholesterol Yes   Essential hypertension    Chronic atrial fibrillation (HCC)    Prediabetes    For follow-up of above.  She is seeing cardiology.  They have added losartan  and diltiazem  for rate and blood pressure control. Patient does feel lightheaded at times.  She is following closely with cardiology.  She is having a lot of pain with her back.  Surgery is scheduled on 7 July.   Review of Systems  Constitutional: Negative.   HENT: Negative.    Eyes:  Negative for blurred vision, discharge and redness.  Respiratory: Negative.    Cardiovascular: Negative.   Gastrointestinal:  Negative for abdominal pain.  Genitourinary: Negative.   Musculoskeletal:  Positive for back pain. Negative for myalgias.  Skin:  Negative for rash.  Neurological:  Negative for tingling, loss of consciousness and weakness.  Endo/Heme/Allergies:  Negative for polydipsia.     Current Outpatient Medications:    apixaban  (ELIQUIS ) 5 MG TABS tablet, Take 1 tablet (5 mg total) by mouth 2 (two) times daily., Disp: 60 tablet, Rfl: 3   calcium  carbonate (OS-CAL) 600 MG tablet, Take 1 tablet (600 mg total) by mouth 2 (two) times daily., Disp: 180 tablet, Rfl: 4   chlorthalidone  (HYGROTON ) 25 MG tablet, TAKE 1 TABLET(25 MG) BY MOUTH DAILY, Disp: 90 tablet, Rfl: 0   Cholecalciferol (VITAMIN D3) 50 MCG (2000 UT) capsule, Take 2,000 Units by mouth daily., Disp: , Rfl:    diltiazem  (CARDIZEM  CD) 120 MG 24 hr capsule, Take 1 capsule (120 mg total) by mouth daily., Disp: 90 capsule, Rfl: 1   losartan  (COZAAR ) 25 MG tablet, Take 1  tablet (25 mg total) by mouth daily., Disp: 90 tablet, Rfl: 3   metoprolol  succinate (TOPROL -XL) 50 MG 24 hr tablet, Take 1 tablet (50 mg total) by mouth daily., Disp: 90 tablet, Rfl: 3   Multiple Vitamins-Minerals (CENTRUM SILVER 50+WOMEN PO), Take 1 tablet by mouth every morning., Disp: , Rfl:    potassium chloride  (KLOR-CON ) 10 MEQ tablet, Take 1 tablet (10 mEq total) by mouth daily., Disp: 30 tablet, Rfl: 6   rosuvastatin  (CRESTOR ) 20 MG tablet, TAKE 1 TABLET(20 MG) BY MOUTH DAILY, Disp: 90 tablet, Rfl: 3   Objective:     BP 132/84 (Cuff Size: Normal)   Pulse 99   Temp (!) 96.5 F (35.8 C) (Temporal)   Ht 5' 5 (1.651 m)   Wt 164 lb 9.6 oz (74.7 kg)   SpO2 98%   BMI 27.39 kg/m    Physical Exam Constitutional:      General: She is not in acute distress.    Appearance: Normal appearance. She is not ill-appearing, toxic-appearing or diaphoretic.  HENT:     Head: Normocephalic and atraumatic.     Right Ear: External ear normal.     Left Ear: External ear normal.   Eyes:     General: No scleral icterus.       Right eye: No discharge.        Left eye: No discharge.  Extraocular Movements: Extraocular movements intact.     Conjunctiva/sclera: Conjunctivae normal.    Cardiovascular:     Rate and Rhythm: Normal rate. Rhythm irregularly irregular.  Pulmonary:     Effort: Pulmonary effort is normal. No respiratory distress.     Breath sounds: Normal breath sounds. No wheezing or rales.  Abdominal:     General: Bowel sounds are normal.   Musculoskeletal:     Cervical back: No rigidity or tenderness.   Skin:    General: Skin is warm and dry.   Neurological:     Mental Status: She is alert and oriented to person, place, and time.   Psychiatric:        Mood and Affect: Mood normal.        Behavior: Behavior normal.      No results found for any visits on 12/10/23.    The ASCVD Risk score (Arnett DK, et al., 2019) failed to calculate for the following  reasons:   The 2019 ASCVD risk score is only valid for ages 75 to 56    Assessment & Plan:   Elevated cholesterol  Essential hypertension  Chronic atrial fibrillation (HCC)  Prediabetes    Return in about 3 months (around 03/11/2024).  Continue current medications.  Patient has some Ultram left that I had previously prescribed for her.  She will try these as needed for pain and then continue follow-up with neurosurgery for pain management after her surgery.  Tonna Frederic, MD

## 2023-12-15 NOTE — Progress Notes (Signed)
 Surgical Instructions   Your procedure is scheduled on December 27, 2023. Report to [facility] Main Entrance A at 8:40 A.M., then check in with the Admitting office. Any questions or running late day of surgery: call [phone]  Questions prior to your surgery date: call [phone], Monday-Friday, 8am-4pm. If you experience any cold or flu symptoms such as cough, fever, chills, shortness of breath, etc. between now and your scheduled surgery, please notify us  at the above number.     Remember:  Do not eat after midnight the night before your surgery  You may drink clear liquids until 7:40 the morning of your surgery.   Clear liquids allowed are: Water, Non-Citrus Juices (without pulp), Carbonated Beverages, Clear Tea (no milk, honey, etc.), Black Coffee Only (NO MILK, CREAM OR POWDERED CREAMER of any kind), and Gatorade.    Take these medicines the morning of surgery with A SIP OF WATER  diltiazem  (CARDIZEM  CD)  metoprolol  succinate (TOPROL -XL)  rosuvastatin  (CRESTOR )    May take these medicines IF NEEDED: LUBRICANT EYE DROPS   apixaban  (ELIQUIS ) Last dose 12-23-23 per instructions given by MD.   One week prior to surgery, STOP taking any Aspirin (unless otherwise instructed by your surgeon) Aleve, Naproxen, Ibuprofen, Motrin, Advil, Goody's, BC's, all herbal medications, fish oil, and non-prescription vitamins.                     Do NOT Smoke (Tobacco/Vaping) for 24 hours prior to your procedure.  If you use a CPAP at night, you may bring your mask/headgear for your overnight stay.   You will be asked to remove any contacts, glasses, piercing's, hearing aid's, dentures/partials prior to surgery. Please bring cases for these items if needed.    Patients discharged the day of surgery will not be allowed to drive home, and someone needs to stay with them for 24 hours.  SURGICAL WAITING ROOM VISITATION Patients may have no more than 2 support people in the waiting area - these  visitors may rotate.   Pre-op nurse will coordinate an appropriate time for 1 ADULT support person, who may not rotate, to accompany patient in pre-op.  Children under the age of 79 must have an adult with them who is not the patient and must remain in the main waiting area with an adult.  If the patient needs to stay at the hospital during part of their recovery, the visitor guidelines for inpatient rooms apply.  Please refer to the [facility] website for the visitor guidelines for any additional information.   If you received a COVID test during your pre-op visit  it is requested that you wear a mask when out in public, stay away from anyone that may not be feeling well and notify your surgeon if you develop symptoms. If you have been in contact with anyone that has tested positive in the last 10 days please notify you surgeon.      Pre-operative 5 CHG Bathing Instructions   You can play a key role in reducing the risk of infection after surgery. Your skin needs to be as free of germs as possible. You can reduce the number of germs on your skin by washing with CHG (chlorhexidine gluconate) soap before surgery. CHG is an antiseptic soap that kills germs and continues to kill germs even after washing.   DO NOT use if you have an allergy to chlorhexidine/CHG or antibacterial soaps. If your skin becomes reddened or irritated, stop using the CHG  and notify one of our RNs at [phone].   Please shower with the CHG soap starting 4 days before surgery using the following schedule:     Please keep in mind the following:  DO NOT shave, including legs and underarms, starting the day of your first shower.   You may shave your face at any point before/day of surgery.  Place clean sheets on your bed the day you start using CHG soap. Use a clean washcloth (not used since being washed) for each shower. DO NOT sleep with pets once you start using the CHG.   CHG Shower Instructions:  Wash your  face and private area with normal soap. If you choose to wash your hair, wash first with your normal shampoo.  After you use shampoo/soap, rinse your hair and body thoroughly to remove shampoo/soap residue.  Turn the water OFF and apply about 3 tablespoons (45 ml) of CHG soap to a CLEAN washcloth.  Apply CHG soap ONLY FROM YOUR NECK DOWN TO YOUR TOES (washing for 3-5 minutes)  DO NOT use CHG soap on face, private areas, open wounds, or sores.  Pay special attention to the area where your surgery is being performed.  If you are having back surgery, having someone wash your back for you may be helpful. Wait 2 minutes after CHG soap is applied, then you may rinse off the CHG soap.  Pat dry with a clean towel  Put on clean clothes/pajamas   If you choose to wear lotion, please use ONLY the CHG-compatible lotions that are listed below.  Additional instructions for the day of surgery: DO NOT APPLY any lotions, deodorants, cologne, or perfumes.   Do not bring valuables to the hospital. [facility] is not responsible for any belongings/valuables. Do not wear nail polish, gel polish, artificial nails, or any other type of covering on natural nails (fingers and toes) Do not wear jewelry or makeup Put on clean/comfortable clothes.  Please brush your teeth.  Ask your nurse before applying any prescription medications to the skin.     CHG Compatible Lotions   Aveeno Moisturizing lotion  Cetaphil Moisturizing Cream  Cetaphil Moisturizing Lotion  Clairol Herbal Essence Moisturizing Lotion, Dry Skin  Clairol Herbal Essence Moisturizing Lotion, Extra Dry Skin  Clairol Herbal Essence Moisturizing Lotion, Normal Skin  Curel Age Defying Therapeutic Moisturizing Lotion with Alpha Hydroxy  Curel Extreme Care Body Lotion  Curel Soothing Hands Moisturizing Hand Lotion  Curel Therapeutic Moisturizing Cream, Fragrance-Free  Curel Therapeutic Moisturizing Lotion, Fragrance-Free  Curel Therapeutic  Moisturizing Lotion, Original Formula  Eucerin Daily Replenishing Lotion  Eucerin Dry Skin Therapy Plus Alpha Hydroxy Crme  Eucerin Dry Skin Therapy Plus Alpha Hydroxy Lotion  Eucerin Original Crme  Eucerin Original Lotion  Eucerin Plus Crme Eucerin Plus Lotion  Eucerin TriLipid Replenishing Lotion  Keri Anti-Bacterial Hand Lotion  Keri Deep Conditioning Original Lotion Dry Skin Formula Softly Scented  Keri Deep Conditioning Original Lotion, Fragrance Free Sensitive Skin Formula  Keri Lotion Fast Absorbing Fragrance Free Sensitive Skin Formula  Keri Lotion Fast Absorbing Softly Scented Dry Skin Formula  Keri Original Lotion  Keri Skin Renewal Lotion Keri Silky Smooth Lotion  Keri Silky Smooth Sensitive Skin Lotion  Nivea Body Creamy Conditioning Oil  Nivea Body Extra Enriched Teacher, adult education Moisturizing Lotion Nivea Crme  Nivea Skin Firming Lotion  NutraDerm 30 Skin Lotion  NutraDerm Skin Lotion  NutraDerm Therapeutic Skin Cream  NutraDerm Therapeutic Skin Lotion  ProShield  Protective Hand Cream  Provon moisturizing lotion  Please read over the following fact sheets that you were given.

## 2023-12-16 ENCOUNTER — Encounter (HOSPITAL_COMMUNITY): Payer: Self-pay

## 2023-12-16 ENCOUNTER — Other Ambulatory Visit: Payer: Self-pay

## 2023-12-16 ENCOUNTER — Encounter (HOSPITAL_COMMUNITY)
Admission: RE | Admit: 2023-12-16 | Discharge: 2023-12-16 | Disposition: A | Discharge status: Home / Self Care | Source: Ambulatory Visit | Attending: Neurological Surgery | Admitting: Neurological Surgery

## 2023-12-16 VITALS — BP 144/81 | HR 97 | Temp 97.8°F | Resp 17 | Ht 65.0 in | Wt 164.2 lb

## 2023-12-16 DIAGNOSIS — E785 Hyperlipidemia, unspecified: Secondary | ICD-10-CM | POA: Insufficient documentation

## 2023-12-16 DIAGNOSIS — I4819 Other persistent atrial fibrillation: Secondary | ICD-10-CM | POA: Diagnosis not present

## 2023-12-16 DIAGNOSIS — I10 Essential (primary) hypertension: Secondary | ICD-10-CM | POA: Insufficient documentation

## 2023-12-16 DIAGNOSIS — Z7901 Long term (current) use of anticoagulants: Secondary | ICD-10-CM | POA: Insufficient documentation

## 2023-12-16 DIAGNOSIS — Z01812 Encounter for preprocedural laboratory examination: Secondary | ICD-10-CM | POA: Insufficient documentation

## 2023-12-16 DIAGNOSIS — Z01818 Encounter for other preprocedural examination: Secondary | ICD-10-CM

## 2023-12-16 HISTORY — DX: Unspecified atrial fibrillation: I48.91

## 2023-12-16 HISTORY — DX: Unspecified osteoarthritis, unspecified site: M19.90

## 2023-12-16 LAB — BASIC METABOLIC PANEL WITH GFR
Anion gap: 11 (ref 5–15)
BUN: 21 mg/dL (ref 8–23)
CO2: 27 mmol/L (ref 22–32)
Calcium: 10.5 mg/dL — ABNORMAL HIGH (ref 8.9–10.3)
Chloride: 101 mmol/L (ref 98–111)
Creatinine, Ser: 0.75 mg/dL (ref 0.44–1.00)
GFR, Estimated: >60 mL/min (ref >60)
Glucose, Bld: 95 mg/dL (ref 70–99)
Potassium: 3.8 mmol/L (ref 3.5–5.1)
Sodium: 139 mmol/L (ref 135–145)

## 2023-12-16 LAB — CBC
HCT: 45.3 % (ref 36.0–46.0)
Hemoglobin: 14.8 g/dL (ref 12.0–15.0)
MCH: 31 pg (ref 26.0–34.0)
MCHC: 32.7 g/dL (ref 30.0–36.0)
MCV: 95 fL (ref 80.0–100.0)
Platelets: 286 10*3/uL (ref 150–400)
RBC: 4.77 MIL/uL (ref 3.87–5.11)
RDW: 13.1 % (ref 11.5–15.5)
WBC: 7.8 10*3/uL (ref 4.0–10.5)
nRBC: 0 % (ref 0.0–0.2)

## 2023-12-16 LAB — TYPE AND SCREEN
ABO/RH(D): A POS
Antibody Screen: NEGATIVE

## 2023-12-16 LAB — SURGICAL PCR SCREEN
MRSA, PCR: NEGATIVE
Staphylococcus aureus: NEGATIVE

## 2023-12-16 NOTE — Progress Notes (Addendum)
 Anesthesia Chart Review:  89 year old female follows with cardiology for history of HTN, HLD, persistent A-fib s/p cardioversion 10/29/2023.  Seen by Dr. Elmira on 11/09/2023 and upcoming surgery was discussed.  Per note, Anticipate upcoming spine surgery by Dr. Joshua. Excellent baseline functional capacity.  Structure currently normal heart on echocardiogram.  Low perioperative cardiac risk.  No stress testing necessary. Given her cardioversion on 10/29/2023, I recommend at least 4 to 6 weeks of uninterrupted anticoagulation post cardioversion.  Patient can proceed with spine surgery anytime after December 05, 2023. I would recommend holding Eliquis  for 2 days before spine surgery, resume day after surgery.  If Dr. Joshua would prefer, I am okay with Eliquis  interruption up to 3 days before the surgery.  Patient reports last dose of Eliquis  12/23/2023.  Preop labs reviewed, unremarkable.  EKG 11/09/2023: Sinus rhythm with marked sinus arrhythmia. Rate 69. Left anterior fascicular block Moderate voltage criteria for LVH, may be normal variant   TTE 11/05/2023:  1. Left ventricular ejection fraction, by estimation, is 60 to 65%. The  left ventricle has normal function. The left ventricle has no regional  wall motion abnormalities. Left ventricular diastolic parameters were  normal.   2. Right ventricular systolic function is normal. The right ventricular  size is normal. There is normal pulmonary artery systolic pressure. The  estimated right ventricular systolic pressure is 26.8 mmHg.   3. Left atrial size was mildly dilated.   4. The mitral valve is normal in structure. Mild mitral valve  regurgitation. No evidence of mitral stenosis.   5. The aortic valve is normal in structure. Aortic valve regurgitation is  not visualized. No aortic stenosis is present.   6. The inferior vena cava is normal in size with greater than 50%  respiratory variability, suggesting right atrial pressure of 3 mmHg.       Lynwood Geofm RIGGERS [facility] Short Stay Center/Anesthesiology Phone [phone] 12/16/2023 4:02 PM

## 2023-12-16 NOTE — Progress Notes (Signed)
 PCP - Dr. Elsie Lent Cardiologist - Dr. Ritchie  PPM/ICD - denies   Chest x-ray - denies EKG - 10/21/23 Stress Test - ECHO - 11/05/23 Cardiac Cath -   Sleep Study - denies  No DM  Last dose of GLP1 agonist-  n/a GLP1 instructions:  n/a  Blood Thinner Instructions:  per cardiologist - Eliquis  should be stopped 3 days prior to surgery.  Last dose will be 7/3 Aspirin Instructions: n/a  ERAS Protcol - clears until 0740 PRE-SURGERY Ensure or G2- n/a  COVID TEST-  n/a   Anesthesia review: yes - A. Fib  Patient denies shortness of breath, fever, cough and chest pain at PAT appointment   All instructions explained to the patient, with a verbal understanding of the material. Patient agrees to go over the instructions while at home for a better understanding. Patient also instructed to self quarantine after being tested for COVID-19. The opportunity to ask questions was provided.

## 2023-12-16 NOTE — Anesthesia Preprocedure Evaluation (Addendum)
 Anesthesia Evaluation  Patient identified by MRN, date of birth, ID band Patient awake    Reviewed: Allergy & Precautions, NPO status , Patient's Chart, lab work & pertinent test results, reviewed documented beta blocker date and time   History of Anesthesia Complications Negative for: history of anesthetic complications  Airway Mallampati: III     Mouth opening: Limited Mouth Opening  Dental no notable dental hx.    Pulmonary neg pulmonary ROS, neg COPD   breath sounds clear to auscultation       Cardiovascular hypertension, Pt. on medications and Pt. on home beta blockers (-) CAD and (-) Past MI + dysrhythmias Atrial Fibrillation  Rhythm:Irregular Rate:Normal  TTE 11/05/23:  1. Left ventricular ejection fraction, by estimation, is 60 to 65%. The  left ventricle has normal function. The left ventricle has no regional  wall motion abnormalities. Left ventricular diastolic parameters were  normal.   2. Right ventricular systolic function is normal. The right ventricular  size is normal. There is normal pulmonary artery systolic pressure. The  estimated right ventricular systolic pressure is 26.8 mmHg.   3. Left atrial size was mildly dilated.   4. The mitral valve is normal in structure. Mild mitral valve  regurgitation. No evidence of mitral stenosis.   5. The aortic valve is normal in structure. Aortic valve regurgitation is  not visualized. No aortic stenosis is present.   6. The inferior vena cava is normal in size with greater than 50%  respiratory variability, suggesting right atrial pressure of 3 mmHg.     Neuro/Psych neg Seizures PSYCHIATRIC DISORDERS      Radiculopathy, lumbar region    GI/Hepatic negative GI ROS, Neg liver ROS,,,  Endo/Other  negative endocrine ROS    Renal/GU negative Renal ROS  negative genitourinary   Musculoskeletal  (+) Arthritis ,    Abdominal   Peds  Hematology negative hematology  ROS (+)   Anesthesia Other Findings Day of surgery medications reviewed with patient.  Reproductive/Obstetrics negative OB ROS                              Anesthesia Physical Anesthesia Plan  ASA: 2  Anesthesia Plan: General   Post-op Pain Management: Tylenol  PO (pre-op)*   Induction: Intravenous  PONV Risk Score and Plan: 3 and Treatment may vary due to age or medical condition, Ondansetron  and Dexamethasone   Airway Management Planned: Oral ETT  Additional Equipment: None  Intra-op Plan:   Post-operative Plan: Extubation in OR  Informed Consent:   Plan Discussed with:   Anesthesia Plan Comments: (PAT note by Lynwood Hope, PA-C:  89 year old female follows with cardiology for history of HTN, HLD, persistent A-fib s/p cardioversion 10/29/2023.  Seen by Dr. Elmira on 11/09/2023 and upcoming surgery was discussed.  Per note, Anticipate upcoming spine surgery by Dr. Joshua. Excellent baseline functional capacity.  Structure currently normal heart on echocardiogram.  Low perioperative cardiac risk.  No stress testing necessary. Given her cardioversion on 10/29/2023, I recommend at least 4 to 6 weeks of uninterrupted anticoagulation post cardioversion.  Patient can proceed with spine surgery anytime after December 05, 2023. I would recommend holding Eliquis  for 2 days before spine surgery, resume day after surgery.  If Dr. Joshua would prefer, I am okay with Eliquis  interruption up to 3 days before the surgery.  Patient reports last dose of Eliquis  12/23/2023.  Preop labs reviewed, unremarkable.  EKG 11/09/2023: Sinus rhythm with marked sinus  arrhythmia. Rate 69. Left anterior fascicular block Moderate voltage criteria for LVH, may be normal variant   TTE 11/05/2023: 1. Left ventricular ejection fraction, by estimation, is 60 to 65%. The  left ventricle has normal function. The left ventricle has no regional  wall motion abnormalities. Left ventricular  diastolic parameters were  normal.  2. Right ventricular systolic function is normal. The right ventricular  size is normal. There is normal pulmonary artery systolic pressure. The  estimated right ventricular systolic pressure is 26.8 mmHg.  3. Left atrial size was mildly dilated.  4. The mitral valve is normal in structure. Mild mitral valve  regurgitation. No evidence of mitral stenosis.  5. The aortic valve is normal in structure. Aortic valve regurgitation is  not visualized. No aortic stenosis is present.  6. The inferior vena cava is normal in size with greater than 50%  respiratory variability, suggesting right atrial pressure of 3 mmHg. )         Anesthesia Quick Evaluation

## 2023-12-17 ENCOUNTER — Other Ambulatory Visit: Payer: Self-pay | Admitting: Family Medicine

## 2023-12-17 DIAGNOSIS — E7800 Pure hypercholesterolemia, unspecified: Secondary | ICD-10-CM

## 2023-12-27 ENCOUNTER — Ambulatory Visit (HOSPITAL_COMMUNITY)
Admission: RE | Disposition: A | Discharge status: Home / Self Care | Payer: Self-pay | Source: Home / Self Care | Attending: Neurological Surgery

## 2023-12-27 ENCOUNTER — Other Ambulatory Visit: Payer: Self-pay

## 2023-12-27 ENCOUNTER — Ambulatory Visit (HOSPITAL_COMMUNITY)

## 2023-12-27 ENCOUNTER — Encounter (HOSPITAL_COMMUNITY): Payer: Self-pay | Admitting: Neurological Surgery

## 2023-12-27 ENCOUNTER — Ambulatory Visit (HOSPITAL_BASED_OUTPATIENT_CLINIC_OR_DEPARTMENT_OTHER): Payer: Self-pay | Admitting: Anesthesiology

## 2023-12-27 ENCOUNTER — Ambulatory Visit (HOSPITAL_COMMUNITY): Payer: Self-pay | Admitting: Physician Assistant

## 2023-12-27 ENCOUNTER — Observation Stay (HOSPITAL_COMMUNITY)
Admission: RE | Admit: 2023-12-27 | Discharge: 2023-12-28 | Disposition: A | Discharge status: Home / Self Care | Attending: Neurological Surgery | Admitting: Neurological Surgery

## 2023-12-27 DIAGNOSIS — Z96653 Presence of artificial knee joint, bilateral: Secondary | ICD-10-CM | POA: Insufficient documentation

## 2023-12-27 DIAGNOSIS — M5416 Radiculopathy, lumbar region: Secondary | ICD-10-CM | POA: Diagnosis not present

## 2023-12-27 DIAGNOSIS — I10 Essential (primary) hypertension: Secondary | ICD-10-CM | POA: Insufficient documentation

## 2023-12-27 DIAGNOSIS — M48061 Spinal stenosis, lumbar region without neurogenic claudication: Principal | ICD-10-CM | POA: Insufficient documentation

## 2023-12-27 DIAGNOSIS — Z981 Arthrodesis status: Principal | ICD-10-CM

## 2023-12-27 DIAGNOSIS — Z7901 Long term (current) use of anticoagulants: Secondary | ICD-10-CM | POA: Insufficient documentation

## 2023-12-27 DIAGNOSIS — I4891 Unspecified atrial fibrillation: Secondary | ICD-10-CM | POA: Insufficient documentation

## 2023-12-27 DIAGNOSIS — Z79899 Other long term (current) drug therapy: Secondary | ICD-10-CM | POA: Diagnosis not present

## 2023-12-27 HISTORY — PX: LAMINECTOMY WITH POSTERIOR LATERAL ARTHRODESIS LEVEL 1: SHX6335

## 2023-12-27 LAB — ABO/RH: ABO/RH(D): A POS

## 2023-12-27 SURGERY — LAMINECTOMY WITH POSTERIOR LATERAL ARTHRODESIS LEVEL 1
Anesthesia: General | Site: Back | Laterality: Left

## 2023-12-27 MED ORDER — POTASSIUM CHLORIDE ER 10 MEQ PO TBCR
10.0000 meq | EXTENDED_RELEASE_TABLET | Freq: Every day | ORAL | Status: DC
  Administered 2023-12-27: 10 meq via ORAL
  Filled 2023-12-27 (×3): qty 1

## 2023-12-27 MED ORDER — METOPROLOL TARTRATE 5 MG/5ML IV SOLN
INTRAVENOUS | Status: AC
  Filled 2023-12-27: qty 5

## 2023-12-27 MED ORDER — PHENOL 1.4 % MT LIQD: 1.0000 | OROMUCOSAL | Status: DC | PRN

## 2023-12-27 MED ORDER — THROMBIN 20000 UNITS EX SOLR: CUTANEOUS | Status: DC | PRN

## 2023-12-27 MED ORDER — PROPOFOL 10 MG/ML IV BOLUS
INTRAVENOUS | Status: AC
  Filled 2023-12-27: qty 20

## 2023-12-27 MED ORDER — CEFAZOLIN SODIUM-DEXTROSE 2-4 GM/100ML-% IV SOLN
2.0000 g | Freq: Three times a day (TID) | INTRAVENOUS | Status: AC
  Administered 2023-12-27 – 2023-12-28 (×2): 2 g via INTRAVENOUS
  Filled 2023-12-27 (×2): qty 100

## 2023-12-27 MED ORDER — PHENYLEPHRINE 80 MCG/ML (10ML) SYRINGE FOR IV PUSH (FOR BLOOD PRESSURE SUPPORT)
PREFILLED_SYRINGE | INTRAVENOUS | Status: DC | PRN
  Administered 2023-12-27 (×6): 80 ug via INTRAVENOUS

## 2023-12-27 MED ORDER — DEXAMETHASONE SODIUM PHOSPHATE 10 MG/ML IJ SOLN
INTRAMUSCULAR | Status: AC
  Filled 2023-12-27: qty 1

## 2023-12-27 MED ORDER — ONDANSETRON HCL 4 MG/2ML IJ SOLN
INTRAMUSCULAR | Status: AC
  Filled 2023-12-27: qty 2

## 2023-12-27 MED ORDER — FENTANYL CITRATE (PF) 100 MCG/2ML IJ SOLN
INTRAMUSCULAR | Status: AC
  Filled 2023-12-27: qty 2

## 2023-12-27 MED ORDER — LIDOCAINE 2% (20 MG/ML) 5 ML SYRINGE
INTRAMUSCULAR | Status: DC | PRN
  Administered 2023-12-27: 100 mg via INTRAVENOUS

## 2023-12-27 MED ORDER — POTASSIUM CHLORIDE IN NACL 20-0.9 MEQ/L-% IV SOLN
INTRAVENOUS | Status: AC
  Filled 2023-12-27: qty 1000

## 2023-12-27 MED ORDER — CELECOXIB 200 MG PO CAPS
200.0000 mg | ORAL_CAPSULE | Freq: Two times a day (BID) | ORAL | Status: DC
  Administered 2023-12-27: 200 mg via ORAL
  Filled 2023-12-27: qty 1

## 2023-12-27 MED ORDER — GABAPENTIN 300 MG PO CAPS
300.0000 mg | ORAL_CAPSULE | ORAL | Status: AC
  Administered 2023-12-27: 300 mg via ORAL
  Filled 2023-12-27: qty 1

## 2023-12-27 MED ORDER — LIDOCAINE 2% (20 MG/ML) 5 ML SYRINGE
INTRAMUSCULAR | Status: AC
  Filled 2023-12-27: qty 5

## 2023-12-27 MED ORDER — THROMBIN 5000 UNITS EX KIT
PACK | CUTANEOUS | Status: AC
  Filled 2023-12-27: qty 1

## 2023-12-27 MED ORDER — OXYCODONE HCL 5 MG/5ML PO SOLN
ORAL | Status: AC
  Filled 2023-12-27: qty 5

## 2023-12-27 MED ORDER — DEXAMETHASONE SODIUM PHOSPHATE 10 MG/ML IJ SOLN
INTRAMUSCULAR | Status: DC | PRN
Start: 2023-12-27 — End: 2023-12-27
  Administered 2023-12-27: 4 mg via INTRAVENOUS

## 2023-12-27 MED ORDER — LOSARTAN POTASSIUM 25 MG PO TABS
25.0000 mg | ORAL_TABLET | Freq: Every day | ORAL | Status: DC
  Administered 2023-12-27: 25 mg via ORAL
  Filled 2023-12-27: qty 1

## 2023-12-27 MED ORDER — BUPIVACAINE HCL (PF) 0.25 % IJ SOLN
INTRAMUSCULAR | Status: DC | PRN
  Administered 2023-12-27: 6 mL

## 2023-12-27 MED ORDER — CHLORHEXIDINE GLUCONATE CLOTH 2 % EX PADS: 6.0000 | MEDICATED_PAD | Freq: Once | CUTANEOUS | Status: DC

## 2023-12-27 MED ORDER — ONDANSETRON HCL 4 MG/2ML IJ SOLN: 4.0000 mg | Freq: Four times a day (QID) | INTRAMUSCULAR | Status: DC | PRN

## 2023-12-27 MED ORDER — BUPIVACAINE HCL (PF) 0.25 % IJ SOLN
INTRAMUSCULAR | Status: AC
  Filled 2023-12-27: qty 30

## 2023-12-27 MED ORDER — CHLORHEXIDINE GLUCONATE CLOTH 2 % EX PADS
6.0000 | MEDICATED_PAD | Freq: Once | CUTANEOUS | Status: AC
Start: 2023-12-27 — End: 2023-12-27
  Administered 2023-12-27: 6 via TOPICAL

## 2023-12-27 MED ORDER — FENTANYL CITRATE (PF) 100 MCG/2ML IJ SOLN
25.0000 ug | INTRAMUSCULAR | Status: DC | PRN
  Administered 2023-12-27 (×3): 25 ug via INTRAVENOUS

## 2023-12-27 MED ORDER — OXYCODONE HCL 5 MG PO TABS: 5.0000 mg | ORAL_TABLET | ORAL | Status: DC | PRN

## 2023-12-27 MED ORDER — ROCURONIUM BROMIDE 10 MG/ML (PF) SYRINGE
PREFILLED_SYRINGE | INTRAVENOUS | Status: DC | PRN
  Administered 2023-12-27: 60 mg via INTRAVENOUS
  Administered 2023-12-27 (×2): 20 mg via INTRAVENOUS

## 2023-12-27 MED ORDER — LACTATED RINGERS IV SOLN: INTRAVENOUS | Status: DC

## 2023-12-27 MED ORDER — METHOCARBAMOL 1000 MG/10ML IJ SOLN: 500.0000 mg | Freq: Four times a day (QID) | INTRAMUSCULAR | Status: DC | PRN

## 2023-12-27 MED ORDER — METHOCARBAMOL 500 MG PO TABS
500.0000 mg | ORAL_TABLET | Freq: Four times a day (QID) | ORAL | Status: DC | PRN
Start: 2023-12-27 — End: 2023-12-28
  Administered 2023-12-27 – 2023-12-28 (×2): 500 mg via ORAL
  Filled 2023-12-27 (×2): qty 1

## 2023-12-27 MED ORDER — SUGAMMADEX SODIUM 200 MG/2ML IV SOLN
INTRAVENOUS | Status: DC | PRN
  Administered 2023-12-27: 147 mg via INTRAVENOUS

## 2023-12-27 MED ORDER — SODIUM CHLORIDE 0.9% FLUSH
3.0000 mL | Freq: Two times a day (BID) | INTRAVENOUS | Status: DC
  Administered 2023-12-27: 3 mL via INTRAVENOUS

## 2023-12-27 MED ORDER — SUGAMMADEX SODIUM 200 MG/2ML IV SOLN
INTRAVENOUS | Status: AC
  Filled 2023-12-27: qty 2

## 2023-12-27 MED ORDER — THROMBIN 20000 UNITS EX KIT
PACK | CUTANEOUS | Status: AC
  Filled 2023-12-27: qty 1

## 2023-12-27 MED ORDER — METOPROLOL TARTRATE 5 MG/5ML IV SOLN
INTRAVENOUS | Status: DC | PRN
Start: 2023-12-27 — End: 2023-12-27
  Administered 2023-12-27: 1 mg via INTRAVENOUS

## 2023-12-27 MED ORDER — OXYCODONE HCL 5 MG/5ML PO SOLN
5.0000 mg | Freq: Once | ORAL | Status: AC | PRN
  Administered 2023-12-27: 5 mg via ORAL

## 2023-12-27 MED ORDER — FENTANYL CITRATE (PF) 250 MCG/5ML IJ SOLN
INTRAMUSCULAR | Status: DC | PRN
Start: 2023-12-27 — End: 2023-12-27
  Administered 2023-12-27: 25 ug via INTRAVENOUS
  Administered 2023-12-27: 50 ug via INTRAVENOUS
  Administered 2023-12-27: 25 ug via INTRAVENOUS

## 2023-12-27 MED ORDER — ONDANSETRON HCL 4 MG/2ML IJ SOLN
INTRAMUSCULAR | Status: DC | PRN
  Administered 2023-12-27: 4 mg via INTRAVENOUS

## 2023-12-27 MED ORDER — CHLORHEXIDINE GLUCONATE 0.12 % MT SOLN
15.0000 mL | Freq: Once | OROMUCOSAL | Status: AC
  Administered 2023-12-27: 15 mL via OROMUCOSAL
  Filled 2023-12-27: qty 15

## 2023-12-27 MED ORDER — PROPOFOL 10 MG/ML IV BOLUS
INTRAVENOUS | Status: DC | PRN
  Administered 2023-12-27: 80 mg via INTRAVENOUS

## 2023-12-27 MED ORDER — PHENYLEPHRINE HCL-NACL 20-0.9 MG/250ML-% IV SOLN
INTRAVENOUS | Status: DC | PRN
  Administered 2023-12-27: 10 ug/min via INTRAVENOUS

## 2023-12-27 MED ORDER — SODIUM CHLORIDE 0.9% FLUSH: 3.0000 mL | INTRAVENOUS | Status: DC | PRN

## 2023-12-27 MED ORDER — OXYCODONE HCL 5 MG PO TABS: 5.0000 mg | ORAL_TABLET | Freq: Once | ORAL | Status: AC | PRN

## 2023-12-27 MED ORDER — SODIUM CHLORIDE 0.9 % IV SOLN: 250.0000 mL | INTRAVENOUS | Status: DC

## 2023-12-27 MED ORDER — THROMBIN 20000 UNITS EX SOLR
CUTANEOUS | Status: AC
  Filled 2023-12-27: qty 20000

## 2023-12-27 MED ORDER — THROMBIN 5000 UNITS EX SOLR: OROMUCOSAL | Status: DC | PRN

## 2023-12-27 MED ORDER — METOPROLOL SUCCINATE ER 50 MG PO TB24: 50.0000 mg | ORAL_TABLET | Freq: Every day | ORAL | Status: DC

## 2023-12-27 MED ORDER — ORAL CARE MOUTH RINSE
15.0000 mL | Freq: Once | OROMUCOSAL | Status: AC
Start: 2023-12-27 — End: 2023-12-27

## 2023-12-27 MED ORDER — ROCURONIUM BROMIDE 10 MG/ML (PF) SYRINGE
PREFILLED_SYRINGE | INTRAVENOUS | Status: AC
  Filled 2023-12-27: qty 10

## 2023-12-27 MED ORDER — ONDANSETRON HCL 4 MG PO TABS: 4.0000 mg | ORAL_TABLET | Freq: Four times a day (QID) | ORAL | Status: DC | PRN

## 2023-12-27 MED ORDER — ACETAMINOPHEN 500 MG PO TABS
1000.0000 mg | ORAL_TABLET | Freq: Four times a day (QID) | ORAL | Status: DC
  Administered 2023-12-27 – 2023-12-28 (×3): 1000 mg via ORAL
  Filled 2023-12-27 (×3): qty 2

## 2023-12-27 MED ORDER — FENTANYL CITRATE (PF) 250 MCG/5ML IJ SOLN
INTRAMUSCULAR | Status: AC
  Filled 2023-12-27: qty 5

## 2023-12-27 MED ORDER — ROCURONIUM BROMIDE 10 MG/ML (PF) SYRINGE
PREFILLED_SYRINGE | INTRAVENOUS | Status: AC
Start: 2023-12-27 — End: 2023-12-27
  Filled 2023-12-27: qty 10

## 2023-12-27 MED ORDER — MENTHOL 3 MG MT LOZG: 1.0000 | LOZENGE | OROMUCOSAL | Status: DC | PRN

## 2023-12-27 MED ORDER — ACETAMINOPHEN 500 MG PO TABS
1000.0000 mg | ORAL_TABLET | ORAL | Status: AC
  Administered 2023-12-27: 1000 mg via ORAL
  Filled 2023-12-27: qty 2

## 2023-12-27 MED ORDER — CEFAZOLIN SODIUM-DEXTROSE 2-4 GM/100ML-% IV SOLN
2.0000 g | INTRAVENOUS | Status: AC
  Administered 2023-12-27: 2 g via INTRAVENOUS
  Filled 2023-12-27: qty 100

## 2023-12-27 MED ORDER — SENNA 8.6 MG PO TABS
1.0000 | ORAL_TABLET | Freq: Two times a day (BID) | ORAL | Status: DC
  Administered 2023-12-27: 8.6 mg via ORAL
  Filled 2023-12-27: qty 1

## 2023-12-27 MED ORDER — CHLORTHALIDONE 25 MG PO TABS: 25.0000 mg | ORAL_TABLET | Freq: Every day | ORAL | Status: DC

## 2023-12-27 MED ORDER — DILTIAZEM HCL ER COATED BEADS 120 MG PO CP24
120.0000 mg | ORAL_CAPSULE | Freq: Every day | ORAL | Status: DC
  Filled 2023-12-27: qty 1

## 2023-12-27 SURGICAL SUPPLY — 55 items
ALLOGRAFT BONE FIBER KORE 5 (Bone Implant) IMPLANT
BAG COUNTER SPONGE SURGICOUNT (BAG) ×1 IMPLANT
BASKET BONE COLLECTION (BASKET) IMPLANT
BENZOIN TINCTURE PRP APPL 2/3 (GAUZE/BANDAGES/DRESSINGS) ×1 IMPLANT
BLADE BONE MILL MEDIUM (MISCELLANEOUS) IMPLANT
BLADE CLIPPER SURG (BLADE) IMPLANT
BUR CARBIDE MATCH 3.0 (BURR) ×1 IMPLANT
CANISTER SUCTION 3000ML PPV (SUCTIONS) ×1 IMPLANT
CLEANSER WND VASHE 34 (WOUND CARE) ×1 IMPLANT
CNTNR URN SCR LID CUP LEK RST (MISCELLANEOUS) ×1 IMPLANT
CONNECTOR OPN REV11 5.0/6.35 (Connector) IMPLANT
COVER BACK TABLE 60X90IN (DRAPES) ×1 IMPLANT
DERMABOND ADVANCED .7 DNX12 (GAUZE/BANDAGES/DRESSINGS) IMPLANT
DRAPE C-ARM 42X72 X-RAY (DRAPES) IMPLANT
DRAPE LAPAROTOMY 100X72X124 (DRAPES) ×1 IMPLANT
DRAPE SURG 17X23 STRL (DRAPES) ×1 IMPLANT
DRSG OPSITE POSTOP 4X6 (GAUZE/BANDAGES/DRESSINGS) IMPLANT
DURAPREP 26ML APPLICATOR (WOUND CARE) ×1 IMPLANT
ELECTRODE REM PT RTRN 9FT ADLT (ELECTROSURGICAL) ×1 IMPLANT
EVACUATOR 1/8 PVC DRAIN (DRAIN) IMPLANT
GAUZE 4X4 16PLY ~~LOC~~+RFID DBL (SPONGE) IMPLANT
GLOVE BIO SURGEON STRL SZ7 (GLOVE) ×1 IMPLANT
GLOVE BIO SURGEON STRL SZ8 (GLOVE) ×2 IMPLANT
GLOVE BIOGEL PI IND STRL 7.0 (GLOVE) ×1 IMPLANT
GOWN STRL REUS W/ TWL LRG LVL3 (GOWN DISPOSABLE) IMPLANT
GOWN STRL REUS W/ TWL XL LVL3 (GOWN DISPOSABLE) ×2 IMPLANT
GOWN STRL REUS W/TWL 2XL LVL3 (GOWN DISPOSABLE) IMPLANT
GRAFT BN 5X1XSPNE CVD POST DBM (Bone Implant) IMPLANT
HEMOSTAT POWDER KIT SURGIFOAM (HEMOSTASIS) ×1 IMPLANT
KIT BASIN OR (CUSTOM PROCEDURE TRAY) ×1 IMPLANT
KIT INFUSE X SMALL 1.4CC (Orthopedic Implant) IMPLANT
KIT POSITIONER JACKSON TABLE (MISCELLANEOUS) IMPLANT
KIT TURNOVER KIT B (KITS) ×1 IMPLANT
MILL BONE PREP (MISCELLANEOUS) IMPLANT
NDL HYPO 25X1 1.5 SAFETY (NEEDLE) ×1 IMPLANT
NEEDLE HYPO 25X1 1.5 SAFETY (NEEDLE) ×1 IMPLANT
NS IRRIG 1000ML POUR BTL (IV SOLUTION) ×1 IMPLANT
PACK LAMINECTOMY NEURO (CUSTOM PROCEDURE TRAY) ×1 IMPLANT
PAD ARMBOARD POSITIONER FOAM (MISCELLANEOUS) ×3 IMPLANT
PATTIES SURGICAL .5 X.5 (GAUZE/BANDAGES/DRESSINGS) ×1 IMPLANT
PATTIES SURGICAL 1X1 (DISPOSABLE) ×1 IMPLANT
ROD LORD LIPPED TI 5.5X50 (Rod) IMPLANT
SCREW CORT SHANK MOD 6.5X40 (Screw) IMPLANT
SCREW POLYAXIAL TULIP (Screw) IMPLANT
SET SCREW SPNE (Screw) IMPLANT
SPONGE SURGIFOAM ABS GEL 100 (HEMOSTASIS) ×1 IMPLANT
SPONGE T-LAP 4X18 ~~LOC~~+RFID (SPONGE) IMPLANT
STRIP CLOSURE SKIN 1/2X4 (GAUZE/BANDAGES/DRESSINGS) ×2 IMPLANT
SUT VIC AB 0 CT1 18XCR BRD8 (SUTURE) ×1 IMPLANT
SUT VIC AB 2-0 CP2 18 (SUTURE) ×1 IMPLANT
SUT VIC AB 3-0 SH 8-18 (SUTURE) ×2 IMPLANT
SYR 30ML SLIP (SYRINGE) ×1 IMPLANT
TOWEL GREEN STERILE (TOWEL DISPOSABLE) ×1 IMPLANT
TOWEL GREEN STERILE FF (TOWEL DISPOSABLE) ×1 IMPLANT
WATER STERILE IRR 1000ML POUR (IV SOLUTION) ×1 IMPLANT

## 2023-12-27 NOTE — Transfer of Care (Signed)
 Immediate Anesthesia Transfer of Care Note  Patient: Kerry Coleman  Procedure(s) Performed: Posterior lateral fusion - Lumbar two-three, left Lumbar two-three hemifacetectomy, extension of instrumentation Lumbar two-three (Left: Back)  Patient Location: PACU  Anesthesia Type:General  Level of Consciousness: awake, alert , and oriented  Airway & Oxygen Therapy: Patient Spontanous Breathing and Patient connected to nasal cannula oxygen  Post-op Assessment: Report given to RN, Post -op Vital signs reviewed and stable, Patient moving all extremities X 4, and Patient able to stick tongue midline  Post vital signs: Reviewed and stable  Last Vitals:  Vitals Value Taken Time  BP 134/68 12/27/23 13:00  Temp 98.0   Pulse 79 12/27/23 13:02  Resp 9 12/27/23 13:02  SpO2 97 % 12/27/23 13:02  Vitals shown include unfiled device data.  Last Pain:  Vitals:   12/27/23 0851  TempSrc: Oral         Complications: No notable events documented.

## 2023-12-27 NOTE — H&P (Signed)
 Subjective: Patient is a 89 y.o. female admitted for L leg pain. Onset of symptoms was several months ago, gradually worsening since that time.  The pain is rated severe, and is located at the across the lower back and radiates to L thigh. The pain is described as aching and occurs all day. The symptoms have been progressive. Symptoms are exacerbated by lying down. MRI or CT showed adjacent level dz L2-3   Past Medical History:  Diagnosis Date   Arthritis    Atrial fibrillation (HCC)    HLD (hyperlipidemia)    HTN (hypertension)     Past Surgical History:  Procedure Laterality Date   BACK SURGERY     BACK SURGERY     10/2022   BREAST BIOPSY Left    @ age 86, Benign    BUNIONECTOMY     CARDIOVERSION N/A 10/29/2023   Procedure: CARDIOVERSION;  Surgeon: Raford Riggs, MD;  Location: [facility] INVASIVE CV LAB;  Service: Cardiovascular;  Laterality: N/A;   CARPAL TUNNEL RELEASE Left    KNEE SURGERY     right carpal tunnel release     right index finger excision of mass     ROTATOR CUFF REPAIR Left    SPINE SURGERY     TONSILLECTOMY     TOTAL KNEE ARTHROPLASTY Bilateral     Prior to Admission medications   Medication Sig Start Date End Date Taking? Authorizing Provider  apixaban  (ELIQUIS ) 5 MG TABS tablet Take 1 tablet (5 mg total) by mouth 2 (two) times daily. 10/06/23  Yes Fenton, Clint R, PA  calcium  carbonate (OS-CAL) 600 MG tablet Take 1 tablet (600 mg total) by mouth 2 (two) times daily. 08/01/20  Yes Berneta Elsie Sayre, MD  chlorthalidone  (HYGROTON ) 25 MG tablet TAKE 1 TABLET(25 MG) BY MOUTH DAILY 11/04/23  Yes Berneta Elsie Sayre, MD  Cholecalciferol (VITAMIN D3) 50 MCG (2000 UT) capsule Take 2,000 Units by mouth daily.   Yes [provider]  diltiazem  (CARDIZEM  CD) 120 MG 24 hr capsule Take 1 capsule (120 mg total) by mouth daily. 11/25/23  Yes [name], Manish J, MD  losartan  (COZAAR ) 25 MG tablet Take 1 tablet (25 mg total) by mouth daily. 11/09/23 02/07/24 Yes  [name], [name], MD  metoprolol  succinate (TOPROL -XL) 50 MG 24 hr tablet Take 1 tablet (50 mg total) by mouth daily. 11/09/23  Yes [name], Manish J, MD  Multiple Vitamins-Minerals (CENTRUM SILVER 50+WOMEN PO) Take 1 tablet by mouth every morning.   Yes [provider]  potassium chloride  (KLOR-CON ) 10 MEQ tablet Take 1 tablet (10 mEq total) by mouth daily. 10/22/23 01/20/24 Yes Fenton, Clint R, PA  rosuvastatin  (CRESTOR ) 20 MG tablet TAKE 1 TABLET(20 MG) BY MOUTH DAILY 12/17/23  Yes Berneta Elsie Sayre, MD  Polyethyl Glycol-Propyl Glycol (LUBRICANT EYE DROPS) 0.4-0.3 % SOLN Place 1-2 drops into both eyes 3 (three) times daily as needed (dry/irritated eyes.).    [provider]   No Known Allergies  Social History   Tobacco Use   Smoking status: Never   Smokeless tobacco: Never   Tobacco comments:    Never smoked 10/21/23  Substance Use Topics   Alcohol use: Yes    Alcohol/week: 7.0 standard drinks of alcohol    Types: 7 Glasses of wine per week    Comment: 1 glass of red wine daily.    Family History  Problem Relation Age of Onset   Heart disease Mother    Stroke Mother    Arthritis Father  Heart disease Father    Hyperlipidemia Sister    Hypertension Sister    Arthritis Sister    Cancer Sister    Breast cancer Sister 79   Breast cancer Sister    Breast cancer Cousin    Hypertension Son    Hyperlipidemia Son    Tremor Neg Hx      Review of Systems  Positive ROS: neg  All other systems have been reviewed and were otherwise negative with the exception of those mentioned in the HPI and as above.  Objective: Vital signs in last 24 hours: Temp:  [98.1 F (36.7 C)] 98.1 F (36.7 C) (07/07 0851) Pulse Rate:  [97] 97 (07/07 0851) Resp:  [19] 19 (07/07 0851) BP: (144)/(69) 144/69 (07/07 0851) SpO2:  [97 %] 97 % (07/07 0851) Weight:  [73.5 kg] 73.5 kg (07/07 0851)  General Appearance: Alert, cooperative, no distress, appears stated age Head:  Normocephalic, without obvious abnormality, atraumatic Eyes: PERRL, conjunctiva/corneas clear, EOM's intact    Neck: Supple, symmetrical, trachea midline Back: Symmetric, no curvature, ROM normal, no CVA tenderness Lungs:  respirations unlabored Heart: Regular rate and rhythm Abdomen: Soft, non-tender Extremities: Extremities normal, atraumatic, no cyanosis or edema Pulses: 2+ and symmetric all extremities Skin: Skin color, texture, turgor normal, no rashes or lesions  NEUROLOGIC:   Mental status: Alert and oriented x4,  no aphasia, good attention span, fund of knowledge, and memory Motor Exam - grossly normal Sensory Exam - grossly normal Reflexes: 1+ Coordination - grossly normal Gait - grossly normal Balance - grossly normal Cranial Nerves: I: smell Not tested  II: visual acuity  OS: nl    OD: nl  II: visual fields Full to confrontation  II: pupils Equal, round, reactive to light  III,VII: ptosis None  III,IV,VI: extraocular muscles  Full ROM  V: mastication Normal  V: facial light touch sensation  Normal  V,VII: corneal reflex  Present  VII: facial muscle function - upper  Normal  VII: facial muscle function - lower Normal  VIII: hearing Not tested  IX: soft palate elevation  Normal  IX,X: gag reflex Present  XI: trapezius strength  5/5  XI: sternocleidomastoid strength 5/5  XI: neck flexion strength  5/5  XII: tongue strength  Normal    Data Review Lab Results  Component Value Date   WBC 7.8 12/16/2023   HGB 14.8 12/16/2023   HCT 45.3 12/16/2023   MCV 95.0 12/16/2023   PLT 286 12/16/2023   Lab Results  Component Value Date   NA 139 12/16/2023   K 3.8 12/16/2023   CL 101 12/16/2023   CO2 27 12/16/2023   BUN 21 12/16/2023   CREATININE 0.75 12/16/2023   GLUCOSE 95 12/16/2023   No results found for: INR, PROTIME  Assessment/Plan:  Estimated body mass index is 26.96 kg/m as calculated from the following:   Height as of this encounter: 5' 5 (1.651  m).   Weight as of this encounter: 73.5 kg. Patient admitted for decompression and instrumented fusion L2-3. Patient has failed a reasonable attempt at conservative therapy.  I explained the condition and procedure to the patient and answered any questions.  Patient wishes to proceed with procedure as planned. Understands risks/ benefits and typical outcomes of procedure.   Kerry Coleman 12/27/2023 9:38 AM

## 2023-12-27 NOTE — Anesthesia Postprocedure Evaluation (Signed)
 Anesthesia Post Note  Patient: Kerry Coleman  Procedure(s) Performed: Posterior lateral fusion - Lumbar two-three, left Lumbar two-three hemifacetectomy, extension of instrumentation Lumbar two-three (Left: Back)     Patient location during evaluation: PACU Anesthesia Type: General Level of consciousness: awake and alert Pain management: pain level controlled Vital Signs Assessment: post-procedure vital signs reviewed and stable Respiratory status: spontaneous breathing, nonlabored ventilation and respiratory function stable Cardiovascular status: blood pressure returned to baseline Postop Assessment: no apparent nausea or vomiting Anesthetic complications: no   No notable events documented.  Last Vitals:  Vitals:   12/27/23 1415 12/27/23 1430  BP: 136/62 128/70  Pulse: 75 79  Resp: 19 15  Temp:    SpO2: 94% 100%    Last Pain:  Vitals:   12/27/23 1430  TempSrc:   PainSc: Asleep                 Vertell Row

## 2023-12-27 NOTE — Anesthesia Procedure Notes (Addendum)
 Procedure Name: Intubation Date/Time: 12/27/2023 10:39 AM  Performed by: Harrold Macintosh, CRNAPre-anesthesia Checklist: Patient identified, Emergency Drugs available, Suction available and Patient being monitored Patient Re-evaluated:Patient Re-evaluated prior to induction Oxygen Delivery Method: Circle system utilized Preoxygenation: Pre-oxygenation with 100% oxygen Induction Type: IV induction Ventilation: Mask ventilation without difficulty and Oral airway inserted - appropriate to patient size Laryngoscope Size: Cleotilde and 2 Grade View: Grade II Tube type: Oral Tube size: 7.0 mm Number of attempts: 1 Airway Equipment and Method: Stylet and Bite block Placement Confirmation: ETT inserted through vocal cords under direct vision, positive ETCO2 and breath sounds checked- equal and bilateral Secured at: 21 cm Tube secured with: Tape Dental Injury: Teeth and Oropharynx as per pre-operative assessment

## 2023-12-27 NOTE — Op Note (Signed)
 12/27/2023  12:42 PM  PATIENT:  Kerry Coleman  89 y.o. female  PRE-OPERATIVE DIAGNOSIS: Recurrent adjacent level stenosis L2-3 with left lower extremity radiculopathy  POST-OPERATIVE DIAGNOSIS:  same  PROCEDURE:   1. Decompressive lumbar laminectomy, hemi facetectomy and foraminotomies L2-3 left to decompress the left L2 and L3 nerve root 2. Posterior fixation L2-3 using ATEC cortical pedicle screws.  3. Intertransverse arthrodesis L2-3 using morcellized autograft and allograft.  SURGEON:  Alm Molt, MD  ASSISTANTS: Suzen Pean, FNP  ANESTHESIA:  General  EBL: 200 ml  Total I/O In: 700 [I.V.:600; IV Piggyback:100] Out: 200 [Blood:200]  BLOOD ADMINISTERED:none  DRAINS: none   INDICATION FOR PROCEDURE: This patient presented with severe left leg pain.  She had a previous L3-L5 fusion.  She had a L2-3 decompressive laminectomy. Imaging revealed recurrent adjacent level stenosis L2-3 with foraminal stenosis compressing the left L2 and L3 nerve roots. The patient tried a reasonable attempt at conservative medical measures without relief. I recommended decompression and instrumented fusion to address the stenosis as well as the segmental  instability.  Patient understood the risks, benefits, and alternatives and potential outcomes and wished to proceed.  PROCEDURE DETAILS:  The patient was brought to the operating room. After induction of generalized endotracheal anesthesia the patient was rolled into the prone position on chest rolls and all pressure points were padded. The patient's lumbar region was cleaned and then prepped with DuraPrep and draped in the usual sterile fashion. Anesthesia was injected and then a dorsal midline incision was made and carried down to the lumbosacral fascia. The fascia was opened and the paraspinous musculature was taken down in a subperiosteal fashion to expose L2-3 as well as the previously placed instrumentation. A self-retaining retractor was  placed. Intraoperative fluoroscopy confirmed my level, and I started with placement of the L2 cortical pedicle screws. The pedicle screw entry zones were identified utilizing surface landmarks and  AP and lateral fluoroscopy. I scored the cortex with the high-speed drill and then used the hand drill to drill an upward and outward direction into the pedicle. I then tapped line to line. I then placed a 6.5 x 40 mm cortical pedicle screw into the pedicles of L2 bilaterally.    I then turned my attention to the decompression and complete lumbar laminectomies, hemi- facetectomies, and foraminotomies were performed at L2-3 on the left.  My nurse practitioner was directly involved in the decompression and exposure of the neural elements. the patient had significant spinal stenosis.The yellow ligament was removed to expose the underlying dura and nerve roots, and generous foraminotomies were performed to adequately decompress the neural elements. Both the exiting and traversing nerve roots were decompressed.  a coronary dilator passed easily along the nerve roots on the left.   We then turned our attention to the placement of the connectors on the rod between L3 and L4.  These were locked to the rod with the antitorque device.  We then decorticated the transverse processes and laid a mixture of morcellized autograft and allograft out over these to perform intertransverse arthrodesis at L2-3. We then placed lordotic rods into the multiaxial screw heads of the pedicle screws and locked these in position with the locking caps and anti-torque device. We then checked our construct with AP and lateral fluoroscopy. Irrigated with copious amounts of saline solution. Inspected the nerve roots once again to assure adequate decompression, lined to the dura with Gelfoam,  and then we closed the muscle and the fascia with 0 Vicryl.  Closed the subcutaneous tissues with 2-0 Vicryl and subcuticular tissues with 3-0 Vicryl. The skin was  closed with benzoin and Steri-Strips. Dressing was then applied, the patient was awakened from general anesthesia and transported to the recovery room in stable condition. At the end of the procedure all sponge, needle and instrument counts were correct.   PLAN OF CARE: admit to inpatient  PATIENT DISPOSITION:  PACU - hemodynamically stable.   Delay start of Pharmacological VTE agent (>24hrs) due to surgical blood loss or risk of bleeding:  yes

## 2023-12-28 ENCOUNTER — Encounter (HOSPITAL_COMMUNITY): Payer: Self-pay | Admitting: Neurological Surgery

## 2023-12-28 DIAGNOSIS — M48061 Spinal stenosis, lumbar region without neurogenic claudication: Secondary | ICD-10-CM | POA: Diagnosis not present

## 2023-12-28 MED ORDER — OXYCODONE HCL 5 MG PO TABS: 5.0000 mg | ORAL_TABLET | ORAL | 0 refills | Status: DC | PRN

## 2023-12-28 MED ORDER — TIZANIDINE HCL 4 MG PO TABS
4.0000 mg | ORAL_TABLET | Freq: Three times a day (TID) | ORAL | 1 refills | Status: DC | PRN
Start: 2023-12-28 — End: 2024-03-10

## 2023-12-28 MED FILL — Thrombin For Soln Kit 20000 Unit: CUTANEOUS | Qty: 1 | Status: AC

## 2023-12-28 NOTE — Plan of Care (Signed)
 Pt doing well. Pt and family given D/C instructions with verbal understanding. Rx's were sent to the pharmacy by MD. Pt's incision is clean and dry with no sign of infection. Pt's IV was removed prior to D/C. Pt D/C'd home via wheelchair per MD order. Pt is stable @ D/C and has no other needs at this time. Rema Fendt, RN

## 2023-12-28 NOTE — Discharge Summary (Signed)
 Physician Discharge Summary  Patient ID: Kerry Coleman MRN: [id number] DOB/AGE: 12-07-1934 89 y.o.  Admit date: 12/27/2023 Discharge date: 12/28/2023  Admission Diagnoses: adjacent level stenosis with radiculopathy    Discharge Diagnoses: same   Discharged Condition: good  Hospital Course: The patient was admitted on 12/27/2023 and taken to the operating room where the patient underwent L2-3 fusion. The patient tolerated the procedure well and was taken to the recovery room and then to the floor in stable condition. The hospital course was routine. There were no complications. The wound remained clean dry and intact. Pt had appropriate back soreness. No complaints of leg pain or new N/T/W. The patient remained afebrile with stable vital signs, and tolerated a regular diet. The patient continued to increase activities, and pain was well controlled with oral pain medications.   Consults: None  Significant Diagnostic Studies:  Results for orders placed or performed during the hospital encounter of 12/27/23  ABO/Rh   Collection Time: 12/27/23  9:20 AM  Result Value Ref Range   ABO/RH(D)      A POS Performed at [facility], [address] [place], [place] [address]     DG Lumbar Spine 2-3 Views Result Date: 12/27/2023 CLINICAL DATA:  [id number] Surgery, elective [id number] EXAM: LUMBAR SPINE - 2-3 VIEW COMPARISON:  Preoperative imaging FINDINGS: Two fluoroscopic spot views of the lumbar spine submitted from the operating room. Previous L3 through L5 fusion. The fusion now extends to the L2 level. Fluoroscopy time 21 seconds. Dose 9.32 mGy. IMPRESSION: Intraoperative fluoroscopy during lumbar surgery. Electronically Signed   By: Andrea Gasman M.D.   On: 12/27/2023 14:37   DG C-Arm 1-60 Min-No Report Result Date: 12/27/2023 Fluoroscopy was utilized by the requesting physician.  No radiographic interpretation.   DG C-Arm 1-60 Min-No Report Result Date: 12/27/2023 Fluoroscopy was utilized by  the requesting physician.  No radiographic interpretation.    Antibiotics:  Anti-infectives (From admission, onward)    Start     Dose/Rate Route Frequency Ordered Stop   12/27/23 1900  ceFAZolin  (ANCEF ) IVPB 2g/100 mL premix        2 g 200 mL/hr over 30 Minutes Intravenous Every 8 hours 12/27/23 1554 12/28/23 0302   12/27/23 0845  ceFAZolin  (ANCEF ) IVPB 2g/100 mL premix        2 g 200 mL/hr over 30 Minutes Intravenous On call to O.R. 12/27/23 0842 12/27/23 1141       Discharge Exam: Blood pressure 113/65, pulse 72, temperature (!) 97.5 F (36.4 C), temperature source Oral, resp. rate 18, height 5' 5 (1.651 m), weight 73.5 kg, SpO2 98%. Neurologic: Grossly normal Dressing dry  Discharge Medications:   Allergies as of 12/28/2023   No Known Allergies      Medication List     TAKE these medications    apixaban  5 MG Tabs tablet Commonly known as: Eliquis  Take 1 tablet (5 mg total) by mouth 2 (two) times daily.   calcium  carbonate 600 MG tablet Commonly known as: OS-CAL Take 1 tablet (600 mg total) by mouth 2 (two) times daily.   CENTRUM SILVER 50+WOMEN PO Take 1 tablet by mouth every morning.   chlorthalidone  25 MG tablet Commonly known as: HYGROTON  TAKE 1 TABLET(25 MG) BY MOUTH DAILY   diltiazem  120 MG 24 hr capsule Commonly known as: Cardizem  CD Take 1 capsule (120 mg total) by mouth daily.   losartan  25 MG tablet Commonly known as: COZAAR  Take 1 tablet (25 mg total) by mouth daily.  Lubricant Eye Drops 0.4-0.3 % Soln Generic drug: Polyethyl Glycol-Propyl Glycol Place 1-2 drops into both eyes 3 (three) times daily as needed (dry/irritated eyes.).   metoprolol  succinate 50 MG 24 hr tablet Commonly known as: TOPROL -XL Take 1 tablet (50 mg total) by mouth daily.   oxyCODONE  5 MG immediate release tablet Commonly known as: Oxy IR/ROXICODONE  Take 1 tablet (5 mg total) by mouth every 4 (four) hours as needed for moderate pain (pain score 4-6).   potassium  chloride 10 MEQ tablet Commonly known as: KLOR-CON  Take 1 tablet (10 mEq total) by mouth daily.   rosuvastatin  20 MG tablet Commonly known as: CRESTOR  TAKE 1 TABLET(20 MG) BY MOUTH DAILY   tiZANidine  4 MG tablet Commonly known as: ZANAFLEX  Take 1 tablet (4 mg total) by mouth every 8 (eight) hours as needed for muscle spasms.   Vitamin D3 50 MCG (2000 UT) capsule Take 2,000 Units by mouth daily.               Durable Medical Equipment  (From admission, onward)           Start     Ordered   12/27/23 1555  DME Walker rolling  Once       Question:  Patient needs a walker to treat with the following condition  Answer:  S/P lumbar fusion   12/27/23 1554   12/27/23 1555  DME 3 n 1  Once        12/27/23 1554            Disposition: home   Final Dx: decompression and fusion L2-3  Discharge Instructions      Remove dressing in 72 hours   Complete by: As directed    Call MD for:  difficulty breathing, headache or visual disturbances   Complete by: As directed    Call MD for:  persistant nausea and vomiting   Complete by: As directed    Call MD for:  redness, tenderness, or signs of infection (pain, swelling, redness, odor or green/yellow discharge around incision site)   Complete by: As directed    Call MD for:  severe uncontrolled pain   Complete by: As directed    Call MD for:  temperature >100.4   Complete by: As directed    Diet - low sodium heart healthy   Complete by: As directed    Discharge instructions   Complete by: As directed    Resume eliquis  in 5 days   Increase activity slowly   Complete by: As directed           Signed: Alm GORMAN Molt 12/28/2023, 7:11 AM

## 2023-12-28 NOTE — Care Management Obs Status (Signed)
 MEDICARE OBSERVATION STATUS NOTIFICATION   Patient Details  Name: Kerry Coleman MRN: [id number] Date of Birth: 1935-01-14   Medicare Observation Status Notification Given:  Yes    Jon Cruel 12/28/2023, 8:41 AM

## 2023-12-28 NOTE — Evaluation (Signed)
 Physical Therapy Evaluation Patient Details Name: Kerry Coleman MRN: [id number] DOB: [birth year] Today's Date: 12/28/2023  History of Present Illness  Pt is an 89 y/o female admitted 7/7 for L2-3 fusion in setting of stenosis. PMH: OA, a fib, HTN, HLD, back sx, B TKA  Clinical Impression  Pt admitted for above. Pt educated with good return adherence and recall of back precautions. Educated on safe care transfers. Pt able to amb without AD with mild L LE antalgia. Encourage pt to go to outpt PT once cleared by surgeon for low back and L LE strengthening to improve stability and balance as pt very active and indep. Pt with no further acute PT needs at this time. PT SIGNING OFF. Please re-consult if needed in future.        If plan is discharge home, recommend the following: A little help with bathing/dressing/bathroom   Can travel by private vehicle        Equipment Recommendations None recommended by PT  Recommendations for Other Services       Functional Status Assessment       Precautions / Restrictions Precautions Precautions: Back Precaution Booklet Issued: Yes (comment) Recall of Precautions/Restrictions: Intact Restrictions Weight Bearing Restrictions Per Provider Order: No      Mobility  Bed Mobility Overal bed mobility: Modified Independent             General bed mobility comments: demonstrated good log roll technique    Transfers Overall transfer level: Modified independent Equipment used: None               General transfer comment: pushed up with UEs, cautious, guarded but steady, minimal trunk flexion    Ambulation/Gait Ambulation/Gait assistance: Supervision Gait Distance (Feet): 300 Feet Assistive device: None Gait Pattern/deviations: Step-through pattern, Decreased stride length, Antalgic Gait velocity: wfl for surgery and age Gait velocity interpretation: 1.31 - 2.62 ft/sec, indicative of limited community ambulator   General Gait  Details: mild L antaglia with trendelenburg gait but no LOB  Stairs Stairs: Yes Stairs assistance: Contact guard assist Stair Management: One rail Left, Step to pattern Number of Stairs: 2 General stair comments: step to leading with R LE going up and L LE going down  Wheelchair Mobility     Tilt Bed    Modified Rankin (Stroke Patients Only)       Balance Overall balance assessment: Mild deficits observed, not formally tested                                           Pertinent Vitals/Pain Pain Assessment Pain Assessment: 0-10 Pain Score: 3  Pain Location: incision site Pain Descriptors / Indicators: Sore    Home Living Family/patient expects to be discharged to:: Private residence Living Arrangements: Spouse/significant other;Children Available Help at Discharge: Family;Available 24 hours/day Type of Home: Other(Comment) (condo) Home Access: Level entry       Home Layout: One level Home Equipment: Shower seat - built in;Grab bars - toilet;Grab bars - tub/shower;Hand held shower head;Adaptive equipment;Other (comment) (adjustable bed)      Prior Function Prior Level of Function : Independent/Modified Independent;Driving             Mobility Comments: no AD despite increased LLE pain ADLs Comments: Indep with ADLs, IADLs, driving though pain limited     Extremity/Trunk Assessment   Upper Extremity Assessment Upper Extremity Assessment: Overall WFL for  tasks assessed    Lower Extremity Assessment Lower Extremity Assessment: Generalized weakness (L weaker than R)    Cervical / Trunk Assessment Cervical / Trunk Assessment: Normal  Communication   Communication Communication: No apparent difficulties    Cognition Arousal: Alert Behavior During Therapy: WFL for tasks assessed/performed   PT - Cognitive impairments: No apparent impairments                         Following commands: Intact       Cueing Cueing  Techniques: Verbal cues     General Comments General comments (skin integrity, edema, etc.): VSS    Exercises     Assessment/Plan    PT Assessment Patient does not need any further PT services  PT Problem List         PT Treatment Interventions      PT Goals (Current goals can be found in the Care Plan section)  Acute Rehab PT Goals Patient Stated Goal: home PT Goal Formulation: All assessment and education complete, DC therapy    Frequency       Co-evaluation               AM-PAC PT 6 Clicks Mobility  Outcome Measure Help needed turning from your back to your side while in a flat bed without using bedrails?: None Help needed moving from lying on your back to sitting on the side of a flat bed without using bedrails?: None Help needed moving to and from a bed to a chair (including a wheelchair)?: None Help needed standing up from a chair using your arms (e.g., wheelchair or bedside chair)?: None Help needed to walk in hospital room?: A Little Help needed climbing 3-5 steps with a railing? : A Little 6 Click Score: 22    End of Session   Activity Tolerance: Patient tolerated treatment well Patient left: in bed;with call bell/phone within reach (sitting EOB) Nurse Communication: Mobility status PT Visit Diagnosis: Muscle weakness (generalized) (M62.81)    Time: 9246-9191 PT Time Calculation (min) (ACUTE ONLY): 15 min   Charges:   PT Evaluation $PT Eval Low Complexity: 1 Low   PT General Charges $$ ACUTE PT VISIT: 1 Visit         Norene Ames, PT, DPT Acute Rehabilitation Services Secure chat preferred Office #: [phone]   [name] 12/28/2023, 10:07 AM

## 2023-12-28 NOTE — Evaluation (Signed)
 Occupational Therapy Evaluation/Discharge Patient Details Name: Kerry Coleman MRN: [id number] DOB: [birth year] Today's Date: 12/28/2023   History of Present Illness   Pt is an 89 y/o female admitted 7/7 for L2-3 fusion in setting of stenosis. PMH: OA, a fib, HTN, HLD, back sx, B TKA     Clinical Impressions PTA, pt lives with spouse and son, typically completely Independent with ADLs, IADLs, driving and mobility without AD though limited by pain. Pt presents with reported pain improvements post op. Educated re: back precautions for ADLs, IADLs, general body mechanics and bed mobility. Pt able to return demo all ADLs and in-room mobility without physical assistance. Pt denies questions or concerns. No further OT services needed at this time.     If plan is discharge home, recommend the following:   Assist for transportation     Functional Status Assessment   Patient has not had a recent decline in their functional status     Equipment Recommendations   None recommended by OT     Recommendations for Other Services         Precautions/Restrictions   Precautions Precautions: Back Precaution Booklet Issued: Yes (comment) Recall of Precautions/Restrictions: Intact Restrictions Weight Bearing Restrictions Per Provider Order: No     Mobility Bed Mobility Overal bed mobility: Modified Independent                  Transfers Overall transfer level: Independent Equipment used: None                      Balance Overall balance assessment: No apparent balance deficits (not formally assessed)                                         ADL either performed or assessed with clinical judgement   ADL Overall ADL's : Independent;Modified independent                                       General ADL Comments: Educated re: back precautions for ADLs, IADL considerations, bed mobility. Pt able to dress self, brush teeth and  perform other grooming tasks standing at sink independently after initial education     Vision Baseline Vision/History: 1 Wears glasses Ability to See in Adequate Light: 0 Adequate Patient Visual Report: No change from baseline Vision Assessment?: No apparent visual deficits     Perception         Praxis         Pertinent Vitals/Pain Pain Assessment Pain Assessment: Faces Faces Pain Scale: Hurts a little bit Pain Location: incision site Pain Descriptors / Indicators: Sore Pain Intervention(s): Monitored during session     Extremity/Trunk Assessment Upper Extremity Assessment Upper Extremity Assessment: Overall WFL for tasks assessed;Right hand dominant   Lower Extremity Assessment Lower Extremity Assessment: Defer to PT evaluation   Cervical / Trunk Assessment Cervical / Trunk Assessment: Normal   Communication Communication Communication: No apparent difficulties   Cognition Arousal: Alert Behavior During Therapy: WFL for tasks assessed/performed Cognition: No apparent impairments                               Following commands: Intact       Cueing  General Comments   Cueing  Techniques: Verbal cues      Exercises     Shoulder Instructions      Home Living Family/patient expects to be discharged to:: Private residence Living Arrangements: Spouse/significant other;Children Available Help at Discharge: Family;Available 24 hours/day Type of Home: Other(Comment) (condo) Home Access: Level entry     Home Layout: One level     Bathroom Shower/Tub: Producer, television/film/video: Handicapped height     Home Equipment: Shower seat - built in;Grab bars - toilet;Grab bars - tub/shower;Hand held shower head;Adaptive equipment;Other (comment) (adjustable bed) Adaptive Equipment: Reacher        Prior Functioning/Environment Prior Level of Function : Independent/Modified Independent;Driving             Mobility Comments: no AD  despite increased LLE pain ADLs Comments: Indep with ADLs, IADLs, driving though pain limited    OT Problem List:     OT Treatment/Interventions:        OT Goals(Current goals can be found in the care plan section)   Acute Rehab OT Goals Patient Stated Goal: go home today, for everything to heal and no more severe pain OT Goal Formulation: All assessment and education complete, DC therapy   OT Frequency:       Co-evaluation              AM-PAC OT 6 Clicks Daily Activity     Outcome Measure Help from another person eating meals?: None Help from another person taking care of personal grooming?: None Help from another person toileting, which includes using toliet, bedpan, or urinal?: None Help from another person bathing (including washing, rinsing, drying)?: None Help from another person to put on and taking off regular upper body clothing?: None Help from another person to put on and taking off regular lower body clothing?: None 6 Click Score: 24   End of Session Nurse Communication: Mobility status  Activity Tolerance: Patient tolerated treatment well Patient left: in bed;with call bell/phone within reach  OT Visit Diagnosis: Other abnormalities of gait and mobility (R26.89)                Time: 9272-9254 OT Time Calculation (min): 18 min Charges:  OT General Charges $OT Visit: 1 Visit OT Evaluation $OT Eval Low Complexity: 1 Low  Mliss NOVAK, OTR/L Acute Rehab Services Office: [phone]   Mliss Fish 12/28/2023, 8:25 AM

## 2024-01-20 ENCOUNTER — Telehealth: Payer: Self-pay | Admitting: Cardiology

## 2024-01-20 NOTE — Telephone Encounter (Signed)
 Pt contacted and appointment has been moved up to 01/26/2024. Pt admits that she has been having some lightheadedness and feels like she she is in A. Fib. Pt reports that heart rate are running 80's to upper 90's. Pt confirms taking Eliquis  and Carvedilol.

## 2024-01-20 NOTE — Telephone Encounter (Signed)
 Noted.  Thanks MJP

## 2024-01-20 NOTE — Telephone Encounter (Signed)
 Pt c/o medication issue:  1. Name of Medication:   apixaban  (ELIQUIS ) 5 MG TABS tablet  diltiazem  (CARDIZEM  CD) 120 MG 24 hr capsule  losartan  (COZAAR ) 25 MG tablet   2. How are you currently taking this medication (dosage and times per day)?   As prescribed  3. Are you having a reaction (difficulty breathing--STAT)?   4. What is your medication issue?   Patient stated she recently had back surgery and has been prescribed additional medications.  Patient stated she has still been in afib.  Patient wants a call back to discuss the medication changes.

## 2024-01-26 ENCOUNTER — Ambulatory Visit: Attending: Cardiology | Admitting: Cardiology

## 2024-01-26 ENCOUNTER — Encounter: Payer: Self-pay | Admitting: Cardiology

## 2024-01-26 VITALS — BP 130/62 | HR 89 | Resp 16 | Ht 65.0 in | Wt 163.6 lb

## 2024-01-26 DIAGNOSIS — I10 Essential (primary) hypertension: Secondary | ICD-10-CM | POA: Insufficient documentation

## 2024-01-26 DIAGNOSIS — I4819 Other persistent atrial fibrillation: Secondary | ICD-10-CM | POA: Diagnosis present

## 2024-01-26 NOTE — Progress Notes (Signed)
 Cardiology Office Note:  .   Date:  01/26/2024  ID:  Kerry Coleman, DOB 02-Sep-1934, MRN [id number] PCP: Kerry Elsie Sayre, MD  [facility] Providers Cardiologist:  Kerry Lawrence, MD PCP: Kerry Elsie Sayre, MD  Chief Complaint  Patient presents with   Persistent atrial fibrillation    Follow-up     Kerry Coleman is a 89 y.o. female with hypertension, hyperlipidemia, persistent A-fib   History of Present Illness  Patient is here today with her husband and daughter-in-law.  Since her last visit with me, she underwent successful spinal surgery and is recovering from the same.  She did not have any fatigue symptoms prior to the surgery while in A-fib, she is been experiencing fatigue and occasional lightheadedness since surgery.  She nervously keeps checking her Apple monitor from time to time and sees heart rates between 70 and 120 bpm.  Patient underwent successful cardioversion 10/2023, but returned to A-fib soon after and has stayed in A-fib ever since then.  Today, patient asks if she could come off at least some medications given the pill burden, including Eliquis .    Vitals:   01/26/24 1432  BP: 130/62  Pulse: 89  Resp: 16  SpO2: 97%       Review of Systems  Constitutional: Positive for malaise/fatigue.  Cardiovascular:  Negative for chest pain, dyspnea on exertion, leg swelling, palpitations and syncope.  Neurological:  Positive for light-headedness (Occasional).        Studies Reviewed: SABRA        EKG 01/26/2024: Atrial fibrillation 79 bpm Left axis deviation Left ventricular hypertrophy ( R in aVL , Cornell product , Romhilt-Estes ) When compared with ECG of 09-Nov-2023 16:24, Atrial fibrillation has replaced Sinus rhythm     Independently interpreted 10/2023: Cr 0.8   12/2022: Chol 213, TG 74, HDL 78, LDL 119  Echocardiogram 11/05/2023: 1. Left ventricular ejection fraction, by estimation, is 60 to 65%. The  left ventricle has  normal function. The left ventricle has no regional  wall motion abnormalities. Left ventricular diastolic parameters were  normal.   2. Right ventricular systolic function is normal. The right ventricular  size is normal. There is normal pulmonary artery systolic pressure. The  estimated right ventricular systolic pressure is 26.8 mmHg.   3. Left atrial size was mildly dilated.   4. The mitral valve is normal in structure. Mild mitral valve  regurgitation. No evidence of mitral stenosis.   5. The aortic valve is normal in structure. Aortic valve regurgitation is  not visualized. No aortic stenosis is present.   6. The inferior vena cava is normal in size with greater than 50%  respiratory variability, suggesting right atrial pressure of 3 mmHg.   Risk Assessment/Calculations:    CHA2DS2-VASc Score = 4   This indicates a 4.8% annual risk of stroke. The patient's score is based upon: CHF History: 0 HTN History: 1 Diabetes History: 0 Stroke History: 0 Vascular Disease History: 0 Age Score: 2 Gender Score: 1     Physical Exam Vitals and nursing note reviewed.  Constitutional:      General: She is not in acute distress. Neck:     Vascular: No JVD.  Cardiovascular:     Rate and Rhythm: Normal rate. Rhythm irregular.     Heart sounds: Normal heart sounds. No murmur heard. Pulmonary:     Effort: Pulmonary effort is normal.     Breath sounds: Normal breath sounds. No wheezing or rales.  Musculoskeletal:  Right lower leg: No edema.     Left lower leg: No edema.      VISIT DIAGNOSES:   ICD-10-CM   1. Persistent atrial fibrillation (HCC)  I48.19 EKG 12-Lead    2. Essential hypertension  I10         Kerry Coleman is a 89 y.o. female with hypertension, hyperlipidemia, persistent A-fib  Assessment & Plan  Persistent A-fib: Back in A-fib since successful cardioversion in 10/2023. I believe this is now persistent A-fib with fairly good rate control. While fatigue  could be attributed to A-fib, she did not have the symptoms before spinal surgery. I think most of her symptoms are related to expected recovery period post spine surgery. Overall, I think she is doing fairly well. I do not think benefits of rhythm control therapy outweigh risks given her relative lack of symptoms. We mutually decided to wait for the 4 to 6 weeks.  If her fatigue symptoms do not improve, we can then consider referral to Dr. Cindie for consideration of rhythm control therapy of A-fib, as well as LAA closure, as she has expressed wanting to come off Eliquis  at some point.  Hypertension: Well-controlled. To reduce pill burden, have stopped her potassium. We could also consider reducing or stopping chlorthalidone  in the future, and increase losartan  dose as needed. She will like to hold off stopping chlorthalidone  for today.   I spent 30 minutes in the care of [name] today including discussing treatment options for atrial fibrillation including rate versus rhythm control, options for stroke prevention and importance of anticoagulation, and documenting in the encounter.     F/u in 6 months  Signed, Kerry [name], MD

## 2024-01-26 NOTE — Patient Instructions (Signed)
 Medication Instructions:  STOP Potassium   *If you need a refill on your cardiac medications before your next appointment, please call your pharmacy*  Follow-Up: At [facility], you and your health needs are our priority.  As part of our continuing mission to provide you with exceptional heart care, our providers are all part of one team.  This team includes your primary Cardiologist (physician) and Advanced Practice Providers or APPs (Physician Assistants and Nurse Practitioners) who all work together to provide you with the care you need, when you need it.  Your next appointment:   4-6 week(s)  Provider:   Newman [name], MD

## 2024-02-10 ENCOUNTER — Other Ambulatory Visit: Payer: Self-pay | Admitting: Family Medicine

## 2024-02-10 DIAGNOSIS — I10 Essential (primary) hypertension: Secondary | ICD-10-CM

## 2024-02-14 ENCOUNTER — Other Ambulatory Visit (HOSPITAL_COMMUNITY): Payer: Self-pay | Admitting: Physician Assistant

## 2024-03-09 ENCOUNTER — Ambulatory Visit: Admitting: Family Medicine

## 2024-03-10 ENCOUNTER — Ambulatory Visit: Attending: Cardiology | Admitting: Cardiology

## 2024-03-10 ENCOUNTER — Ambulatory Visit (INDEPENDENT_AMBULATORY_CARE_PROVIDER_SITE_OTHER)

## 2024-03-10 ENCOUNTER — Encounter: Payer: Self-pay | Admitting: Cardiology

## 2024-03-10 VITALS — BP 140/70 | HR 92 | Ht 65.0 in | Wt 163.3 lb

## 2024-03-10 DIAGNOSIS — I10 Essential (primary) hypertension: Secondary | ICD-10-CM | POA: Insufficient documentation

## 2024-03-10 DIAGNOSIS — I4819 Other persistent atrial fibrillation: Secondary | ICD-10-CM

## 2024-03-10 LAB — CBC

## 2024-03-10 NOTE — Progress Notes (Signed)
 Cardiology Office Note:  .   Date:  03/10/2024  ID:  Kerry Coleman, DOB 09-20-1934, MRN [id number] PCP: Berneta Elsie Sayre, MD  [name] Cardiologist:  Newman Lawrence, MD PCP: Berneta Elsie Sayre, MD  Chief Complaint  Patient presents with   Atrial Fibrillation     Kerry Coleman is a 89 y.o. female with hypertension, hyperlipidemia, persistent A-fib   History of Present Illness  Patient is here today with her husband and daughter-in-law.  She continues to feel fatigued and tired.  She also has continued pain since her back surgery.  She is compliant with her medical therapy.    Vitals:   03/10/24 1347  BP: (!) 140/70  Pulse: 92  SpO2: 96%        Review of Systems  Constitutional: Positive for malaise/fatigue.  Cardiovascular:  Negative for chest pain, dyspnea on exertion, leg swelling, palpitations and syncope.  Neurological:  Positive for light-headedness (Occasional).        Studies Reviewed: SABRA        EKG 01/26/2024: Atrial fibrillation 79 bpm Left axis deviation Left ventricular hypertrophy ( R in aVL , Cornell product , Romhilt-Estes ) When compared with ECG of 09-Nov-2023 16:24, Atrial fibrillation has replaced Sinus rhythm     Independently interpreted 10/2023: Cr 0.8   12/2022: Chol 213, TG 74, HDL 78, LDL 119  Echocardiogram 11/05/2023: 1. Left ventricular ejection fraction, by estimation, is 60 to 65%. The  left ventricle has normal function. The left ventricle has no regional  wall motion abnormalities. Left ventricular diastolic parameters were  normal.   2. Right ventricular systolic function is normal. The right ventricular  size is normal. There is normal pulmonary artery systolic pressure. The  estimated right ventricular systolic pressure is 26.8 mmHg.   3. Left atrial size was mildly dilated.   4. The mitral valve is normal in structure. Mild mitral valve  regurgitation. No evidence of mitral  stenosis.   5. The aortic valve is normal in structure. Aortic valve regurgitation is  not visualized. No aortic stenosis is present.   6. The inferior vena cava is normal in size with greater than 50%  respiratory variability, suggesting right atrial pressure of 3 mmHg.   Risk Assessment/Calculations:    CHA2DS2-VASc Score = 4   This indicates a 4.8% annual risk of stroke. The patient's score is based upon: CHF History: 0 HTN History: 1 Diabetes History: 0 Stroke History: 0 Vascular Disease History: 0 Age Score: 2 Gender Score: 1     Physical Exam Vitals and nursing note reviewed.  Constitutional:      General: She is not in acute distress. Neck:     Vascular: No JVD.  Cardiovascular:     Rate and Rhythm: Normal rate. Rhythm irregular.     Heart sounds: Normal heart sounds. No murmur heard. Pulmonary:     Effort: Pulmonary effort is normal.     Breath sounds: Normal breath sounds. No wheezing or rales.  Musculoskeletal:     Right lower leg: No edema.     Left lower leg: No edema.      VISIT DIAGNOSES:   ICD-10-CM   1. Primary hypertension  I10     2. Persistent atrial fibrillation [facility])  I48.19          Kerry Coleman is a 89 y.o. female with hypertension, hyperlipidemia, persistent A-fib  Assessment & Plan  Persistent A-fib: Successful cardioversion 10/2023 with return to A-fib. Challenge has been trying  to figure out the etiology of her symptoms, especially fatigue. It is quite possible that fatigue is related to A-fib. Before subjecting her to antiarrhythmic therapy, we had mutually decided on the following plan: Will perform cardioversion to see if her symptoms improve while she is out of A-fib.  It is quite likely that she would return to A-fib even after successful cardioversion.  She will wear a 2-week monitor after cardioversion to assess for A-fib burden post cardioversion.  That will help us  decide on further decision making whether she would  benefit from rhythm control therapy.  At her age, it is less likely that ablation would be opted for, but could need antiarrhythmic medications at that time. Emphasized on the importance of continuation of anticoagulation with Eliquis  5 mg twice daily. Continue metoprolol , diltiazem .  Hypertension: Generally well-controlled.    Informed Consent   Shared Decision Making/Informed Consent The risks (stroke, cardiac arrhythmias rarely resulting in the need for a temporary or permanent pacemaker, skin irritation or burns and complications associated with conscious sedation including aspiration, arrhythmia, respiratory failure and death), benefits (restoration of normal sinus rhythm) and alternatives of a direct current cardioversion were explained in detail to [name] and she agrees to proceed.        F/u in 4-6 weeks after cardioversion  Signed, Newman [name], MD

## 2024-03-10 NOTE — Patient Instructions (Signed)
 Lab Work: CBC BMP  If you have labs (blood work) drawn today and your tests are completely normal, you will receive your results only by: MyChart Message (if you have MyChart) OR A paper copy in the mail If you have any lab test that is abnormal or we need to change your treatment, we will call you to review the results.  Testing/Procedures: Cardioversion  2 WEEK ZIO (AFTER CARDIOVERSION)  Your physician has requested that you wear a Zio heart monitor for __14___ days. This will be mailed to your home with instructions on how to apply the monitor and how to return it when finished. Please allow 2 weeks after returning the heart monitor before our office calls you with the results.   Follow-Up: At [facility], you and your health needs are our priority.  As part of our continuing mission to provide you with exceptional heart care, our providers are all part of one team.  This team includes your primary Cardiologist (physician) and Advanced Practice Providers or APPs (Physician Assistants and Nurse Practitioners) who all work together to provide you with the care you need, when you need it.  Your next appointment:   4-6 week(s) AFTER CARDIOVERSION  Provider:   Newman [name], MD    We recommend signing up for the patient portal called MyChart.  Sign up information is provided on this After Visit Summary.  MyChart is used to connect with patients for Virtual Visits (Telemedicine).  Patients are able to view lab/test results, encounter notes, upcoming appointments, etc.  Non-urgent messages can be sent to your provider as well.   To learn more about what you can do with MyChart, go to [email or web address].   Other Instructions     Dear Kerry Coleman  You are scheduled for a Cardioversion on Tuesday, September 23 with Dr. Coleta.  Please arrive at the [facility] (Main Entrance A) at [facility]: [address] [place], [place] [address] at 10:30 AM (This  time is 1 hour(s) before your procedure to ensure your preparation).   Free valet parking service is available. You will check in at ADMITTING.   *Please Note: You will receive a call the day before your procedure to confirm the appointment time. That time may have changed from the original time based on the schedule for that day.*    DIET:  Nothing to eat or drink after midnight except a sip of water with medications (see medication instructions below)  MEDICATION INSTRUCTIONS: !!IF ANY NEW MEDICATIONS ARE STARTED AFTER TODAY, PLEASE NOTIFY YOUR PROVIDER AS SOON AS POSSIBLE!!  FYI: Medications such as Semaglutide (Ozempic, [place]), Tirzepatide (Mounjaro, Zepbound), Dulaglutide (Trulicity), etc (GLP1 agonists) AND Canagliflozin (Invokana), Dapagliflozin (Farxiga), Empagliflozin (Jardiance), Ertugliflozin (Steglatro), Bexagliflozin [employer]) or any combination with one of these drugs such as Invokamet (Canagliflozin/Metformin), Synjardy (Empagliflozin/Metformin), etc (SGLT2 inhibitors) must be held around the time of a procedure. This is not a comprehensive list of all of these drugs. Please review all of your medications and talk to your provider if you take any one of these. If you are not sure, ask your provider.     Continue taking your anticoagulant (blood thinner): Apixaban  (Eliquis ).  You will need to continue this after your procedure until you are told by your provider that it is safe to stop.    LABS:  TODAY  FYI:  For your safety, and to allow us  to monitor your vital signs accurately during the surgery/procedure we request: If you have artificial  nails, gel coating, SNS etc, please have those removed prior to your surgery/procedure. Not having the nail coverings /polish removed may result in cancellation or delay of your surgery/procedure.  Your support person will be asked to wait in the waiting room during your procedure.  It is OK to have someone drop you off and come back  when you are ready to be discharged.  You cannot drive after the procedure and will need someone to drive you home.  Bring your insurance cards.  *Special Note: Every effort is made to have your procedure done on time. Occasionally there are emergencies that occur at the hospital that may cause delays. Please be patient if a delay does occur.

## 2024-03-10 NOTE — Progress Notes (Unsigned)
 Enrolled patient for a 14 day Zio XT monitor to be mailed to patients home around 03-22-24

## 2024-03-11 LAB — CBC
Hematocrit: 41.3 % (ref 34.0–46.6)
Hemoglobin: 13.5 g/dL (ref 11.1–15.9)
MCH: 31.3 pg (ref 26.6–33.0)
MCHC: 32.7 g/dL (ref 31.5–35.7)
MCV: 96 fL (ref 79–97)
Platelets: 271 x10E3/uL (ref 150–450)
RBC: 4.32 x10E6/uL (ref 3.77–5.28)
RDW: 11.9 % (ref 11.7–15.4)
WBC: 8.5 x10E3/uL (ref 3.4–10.8)

## 2024-03-11 LAB — BASIC METABOLIC PANEL WITH GFR
BUN/Creatinine Ratio: 28 (ref 12–28)
BUN: 20 mg/dL (ref 8–27)
CO2: 24 mmol/L (ref 20–29)
Calcium: 10.3 mg/dL (ref 8.7–10.3)
Chloride: 101 mmol/L (ref 96–106)
Creatinine, Ser: 0.71 mg/dL (ref 0.57–1.00)
Glucose: 91 mg/dL (ref 70–99)
Potassium: 3.9 mmol/L (ref 3.5–5.2)
Sodium: 141 mmol/L (ref 134–144)
eGFR: 82 mL/min/1.73 (ref >59)

## 2024-03-12 ENCOUNTER — Ambulatory Visit: Payer: Self-pay | Admitting: Cardiology

## 2024-03-13 NOTE — Progress Notes (Signed)
 Spoke to patient and instructed them to come at 1030  and to be NPO after 0000.     Confirmed that patient will have a ride home and someone to stay with them for 24 hours after the procedure.   Medications reviewed.  Confirmed blood thinner.  Confirmed no breaks in taking blood thinner for 3+ weeks prior to procedure.

## 2024-03-14 ENCOUNTER — Ambulatory Visit (HOSPITAL_COMMUNITY): Admitting: Anesthesiology

## 2024-03-14 ENCOUNTER — Encounter (HOSPITAL_COMMUNITY)
Admission: RE | Disposition: A | Discharge status: Home / Self Care | Payer: Self-pay | Source: Home / Self Care | Attending: Cardiology

## 2024-03-14 ENCOUNTER — Other Ambulatory Visit: Payer: Self-pay

## 2024-03-14 ENCOUNTER — Encounter (HOSPITAL_COMMUNITY): Payer: Self-pay | Admitting: Cardiology

## 2024-03-14 ENCOUNTER — Ambulatory Visit (HOSPITAL_COMMUNITY)
Admission: RE | Admit: 2024-03-14 | Discharge: 2024-03-14 | Disposition: A | Discharge status: Home / Self Care | Attending: Cardiology | Admitting: Cardiology

## 2024-03-14 DIAGNOSIS — I4891 Unspecified atrial fibrillation: Secondary | ICD-10-CM | POA: Diagnosis not present

## 2024-03-14 DIAGNOSIS — E7800 Pure hypercholesterolemia, unspecified: Secondary | ICD-10-CM | POA: Diagnosis not present

## 2024-03-14 DIAGNOSIS — I4819 Other persistent atrial fibrillation: Secondary | ICD-10-CM | POA: Insufficient documentation

## 2024-03-14 DIAGNOSIS — R7303 Prediabetes: Secondary | ICD-10-CM | POA: Diagnosis not present

## 2024-03-14 DIAGNOSIS — Z7901 Long term (current) use of anticoagulants: Secondary | ICD-10-CM | POA: Insufficient documentation

## 2024-03-14 DIAGNOSIS — E785 Hyperlipidemia, unspecified: Secondary | ICD-10-CM

## 2024-03-14 DIAGNOSIS — I482 Chronic atrial fibrillation, unspecified: Secondary | ICD-10-CM

## 2024-03-14 DIAGNOSIS — I10 Essential (primary) hypertension: Secondary | ICD-10-CM | POA: Insufficient documentation

## 2024-03-14 HISTORY — PX: CARDIOVERSION: EP1203

## 2024-03-14 SURGERY — CARDIOVERSION (CATH LAB)
Anesthesia: General

## 2024-03-14 MED ORDER — LIDOCAINE 2% (20 MG/ML) 5 ML SYRINGE
INTRAMUSCULAR | Status: DC | PRN
  Administered 2024-03-14: 40 mg via INTRAVENOUS

## 2024-03-14 MED ORDER — PROPOFOL 10 MG/ML IV BOLUS
INTRAVENOUS | Status: DC | PRN
  Administered 2024-03-14: 50 mg via INTRAVENOUS

## 2024-03-14 MED ORDER — SODIUM CHLORIDE 0.9 % IV SOLN: 250.0000 mL | INTRAVENOUS | Status: DC | PRN

## 2024-03-14 MED ORDER — SODIUM CHLORIDE 0.9% FLUSH: 3.0000 mL | Freq: Two times a day (BID) | INTRAVENOUS | Status: DC

## 2024-03-14 MED ORDER — SODIUM CHLORIDE 0.9% FLUSH: 3.0000 mL | INTRAVENOUS | Status: DC | PRN

## 2024-03-14 MED ORDER — SODIUM CHLORIDE 0.9% FLUSH
INTRAVENOUS | Status: DC | PRN
Start: 2024-03-14 — End: 2024-03-14
  Administered 2024-03-14: 5 mL via INTRAVENOUS

## 2024-03-14 SURGICAL SUPPLY — 1 items: PAD DEFIB RADIO PHYSIO CONN (PAD) ×1 IMPLANT

## 2024-03-14 NOTE — Transfer of Care (Signed)
 Immediate Anesthesia Transfer of Care Note  Patient: Kerry Coleman  Procedure(s) Performed: CARDIOVERSION  Patient Location: Cath Lab  Anesthesia Type:General  Level of Consciousness: awake, alert , oriented, and patient cooperative  Airway & Oxygen Therapy: Patient Spontanous Breathing and Patient connected to nasal cannula oxygen  Post-op Assessment: Report given to RN and Post -op Vital signs reviewed and stable  Post vital signs: Reviewed and stable11:44  Last Vitals:  Vitals Value Taken Time  BP 108/78 MAP 87 11:44  Temp    Pulse 69   Resp 16   SpO2 98     Last Pain:  Vitals:   03/14/24 1055  TempSrc:   PainSc: 0-No pain         Complications: There were no known notable events for this encounter.

## 2024-03-14 NOTE — H&P (Signed)
 Office Visit 03/10/2024 [facility] at [facility]    [name], [name], MD Cardiology Primary hypertension +1 more Dx Atrial Fibrillation; Referred by Berneta Elsie Sayre, MD Reason for Visit   Additional Documentation  Vitals: BP 140/70 Important    Pulse 92   Ht 5' 5 (1.651 m)   Wt 74.1 kg   SpO2 96%   BMI 27.17 kg/m   BSA 1.84 m  Flowsheets: NEWS,   MEWS Score,   Vital Signs,   Anthropometrics  Encounter Info: Billing Info,   History,   Allergies,   Detailed Report   All Notes   Progress Notes by Elmira [name], MD at 03/10/2024 1:20 PM  Author: Elmira [name], MD Author Type: Physician Filed: 03/10/2024  3:04 PM  Note Status: Signed Cosign: Cosign Not Required Encounter Date: 03/10/2024  Editor: Elmira [name], MD (Physician)             Expand All Collapse All  Cardiology Office Note:  .   Date:  03/10/2024  ID:  Kerry Coleman, DOB 03/19/1935, MRN [id number] PCP: Berneta Elsie Sayre, MD  [facility] Providers Cardiologist:  Newman Elmira, MD PCP: Berneta Elsie Sayre, MD      Chief Complaint  Patient presents with   Atrial Fibrillation        Kerry Coleman is a 89 y.o. female with hypertension, hyperlipidemia, persistent A-fib    History of Present Illness   Patient is here today with her husband and daughter-in-law.  She continues to feel fatigued and tired.  She also has continued pain since her back surgery.  She is compliant with her medical therapy.          Vitals:    03/10/24 1347  BP: (!) 140/70  Pulse: 92  SpO2: 96%              Review of Systems  Constitutional: Positive for malaise/fatigue.  Cardiovascular:  Negative for chest pain, dyspnea on exertion, leg swelling, palpitations and syncope.  Neurological:  Positive for light-headedness (Occasional).            Studies Reviewed: SABRA         EKG 01/26/2024: Atrial fibrillation 79  bpm Left axis deviation Left ventricular hypertrophy ( R in aVL , Cornell product , Romhilt-Estes ) When compared with ECG of 09-Nov-2023 16:24, Atrial fibrillation has replaced Sinus rhythm      Independently interpreted 10/2023: Cr 0.8     12/2022: Chol 213, TG 74, HDL 78, LDL 119   Echocardiogram 11/05/2023: 1. Left ventricular ejection fraction, by estimation, is 60 to 65%. The  left ventricle has normal function. The left ventricle has no regional  wall motion abnormalities. Left ventricular diastolic parameters were  normal.   2. Right ventricular systolic function is normal. The right ventricular  size is normal. There is normal pulmonary artery systolic pressure. The  estimated right ventricular systolic pressure is 26.8 mmHg.   3. Left atrial size was mildly dilated.   4. The mitral valve is normal in structure. Mild mitral valve  regurgitation. No evidence of mitral stenosis.   5. The aortic valve is normal in structure. Aortic valve regurgitation is  not visualized. No aortic stenosis is present.   6. The inferior vena cava is normal in size with greater than 50%  respiratory variability, suggesting right atrial pressure of 3 mmHg.    Risk Assessment/Calculations:     CHA2DS2-VASc  Score = 4   This indicates a 4.8% annual risk of stroke. The patient's score is based upon: CHF History: 0 HTN History: 1 Diabetes History: 0 Stroke History: 0 Vascular Disease History: 0 Age Score: 2 Gender Score: 1       Physical Exam Vitals and nursing note reviewed.  Constitutional:      General: She is not in acute distress. Neck:     Vascular: No JVD.  Cardiovascular:     Rate and Rhythm: Normal rate. Rhythm irregular.     Heart sounds: Normal heart sounds. No murmur heard. Pulmonary:     Effort: Pulmonary effort is normal.     Breath sounds: Normal breath sounds. No wheezing or rales.  Musculoskeletal:     Right lower leg: No edema.     Left lower leg: No edema.          VISIT DIAGNOSES:     ICD-10-CM    1. Primary hypertension  I10       2. Persistent atrial fibrillation [facility])  I48.19                 Kerry Coleman is a 89 y.o. female with hypertension, hyperlipidemia, persistent A-fib  Assessment & Plan   Persistent A-fib: Successful cardioversion 10/2023 with return to A-fib. Challenge has been trying to figure out the etiology of her symptoms, especially fatigue. It is quite possible that fatigue is related to A-fib. Before subjecting her to antiarrhythmic therapy, we had mutually decided on the following plan: Will perform cardioversion to see if her symptoms improve while she is out of A-fib.  It is quite likely that she would return to A-fib even after successful cardioversion.  She will wear a 2-week monitor after cardioversion to assess for A-fib burden post cardioversion.  That will help us  decide on further decision making whether she would benefit from rhythm control therapy.  At her age, it is less likely that ablation would be opted for, but could need antiarrhythmic medications at that time. Emphasized on the importance of continuation of anticoagulation with Eliquis  5 mg twice daily. Continue metoprolol , diltiazem .   Hypertension: Generally well-controlled.     Informed Consent Shared Decision Making/Informed Consent The risks (stroke, cardiac arrhythmias rarely resulting in the need for a temporary or permanent pacemaker, skin irritation or burns and complications associated with conscious sedation including aspiration, arrhythmia, respiratory failure and death), benefits (restoration of normal sinus rhythm) and alternatives of a direct current cardioversion were explained in detail to Ms. Hoose and she agrees to proceed.           F/u in 4-6 weeks after cardioversion   Signed, Newman [name], MD          Patient Instructions by Manda Lyle NOVAK, RN at 03/10/2024 1:20 PM  Author: Manda Lyle NOVAK, RN  Author Type: Registered Nurse Filed: 03/10/2024  2:14 PM  Note Status: Signed Cosign: Cosign Not Required Encounter Date: 03/10/2024  Editor: Manda Lyle NOVAK, RN (Registered Nurse)               Lab Work: CBC BMP   If you have labs (blood work) drawn today and your tests are completely normal, you will receive your results only by: MyChart Message (if you have MyChart) OR A paper copy in the mail If you have any lab test that is abnormal or we need to change your treatment, we will call you to review the results.   Testing/Procedures: Cardioversion  2 WEEK ZIO (AFTER CARDIOVERSION)   Your physician has requested that you wear a Zio heart monitor for __14___ days. This will be mailed to your home with instructions on how to apply the monitor and how to return it when finished. Please allow 2 weeks after returning the heart monitor before our office calls you with the results.    Follow-Up: At [facility], you and your health needs are our priority.  As part of our continuing mission to provide you with exceptional heart care, our providers are all part of one team.  This team includes your primary Cardiologist (physician) and Advanced Practice Providers or APPs (Physician Assistants and Nurse Practitioners) who all work together to provide you with the care you need, when you need it.   Your next appointment:   4-6 week(s) AFTER CARDIOVERSION   Provider:   Newman [name], MD       For DCCV; compliant with apixaban ; no changes. Luster Shallow

## 2024-03-14 NOTE — Anesthesia Postprocedure Evaluation (Signed)
 Anesthesia Post Note  Patient: Kerry Coleman  Procedure(s) Performed: CARDIOVERSION     Patient location during evaluation: PACU Anesthesia Type: General Level of consciousness: awake Pain management: pain level controlled Vital Signs Assessment: post-procedure vital signs reviewed and stable Respiratory status: spontaneous breathing, nonlabored ventilation and respiratory function stable Cardiovascular status: blood pressure returned to baseline and stable Postop Assessment: no apparent nausea or vomiting Anesthetic complications: no   There were no known notable events for this encounter.  Last Vitals:  Vitals:   03/14/24 1215 03/14/24 1220  BP: 137/72 133/61  Pulse: 70 74  Resp: 18 17  Temp:    SpO2: 98% 98%    Last Pain:  Vitals:   03/14/24 1220  TempSrc:   PainSc: 0-No pain                 Delon Aisha Arch

## 2024-03-14 NOTE — Procedures (Signed)
 Electrical Cardioversion Procedure Note [name] [id number] Dec 26, 1934  Procedure: Electrical Cardioversion Indications:  Atrial Fibrillation  Procedure Details Consent: Risks of procedure as well as the alternatives and risks of each were explained to the (patient/caregiver).  Consent for procedure obtained. Time Out: Verified patient identification, verified procedure, site/side was marked, verified correct patient position, special equipment/implants available, medications/allergies/relevent history reviewed, required imaging and test results available.  Performed  Patient placed on cardiac monitor, pulse oximetry, supplemental oxygen as necessary.  Sedation given: Pt sedated by anesthesia with lidocaine  40 mg and diprovan 50 mg IV.  Pacer pads placed anterior and posterior chest.  Cardioverted 1 time(s).  Cardioverted at 300J.  Evaluation Findings: Post procedure EKG shows: NSR Complications: None Patient did tolerate procedure well.   Kerry Coleman 03/14/2024, 10:53 AM

## 2024-03-14 NOTE — Anesthesia Preprocedure Evaluation (Addendum)
 Anesthesia Evaluation  Patient identified by MRN, date of birth, ID band Patient awake    Reviewed: Allergy & Precautions, NPO status , Patient's Chart, lab work & pertinent test results  History of Anesthesia Complications Negative for: history of anesthetic complications  Airway Mallampati: III  TM Distance: >3 FB Neck ROM: Full   Comment: Previous grade II view with Miller 2, easy mask with OPA Dental  (+) Dental Advisory Given   Pulmonary neg pulmonary ROS, neg shortness of breath, neg sleep apnea, neg COPD, neg recent URI   Pulmonary exam normal breath sounds clear to auscultation       Cardiovascular hypertension (losartan , chlorthalidone , metoprolol ), Pt. on medications and Pt. on home beta blockers (-) angina (-) Past MI, (-) Cardiac Stents and (-) CABG + dysrhythmias Atrial Fibrillation  Rhythm:Regular Rate:Normal  HLD  TTE 11/05/2023: IMPRESSIONS    1. Left ventricular ejection fraction, by estimation, is 60 to 65%. The  left ventricle has normal function. The left ventricle has no regional  wall motion abnormalities. Left ventricular diastolic parameters were  normal.   2. Right ventricular systolic function is normal. The right ventricular  size is normal. There is normal pulmonary artery systolic pressure. The  estimated right ventricular systolic pressure is 26.8 mmHg.   3. Left atrial size was mildly dilated.   4. The mitral valve is normal in structure. Mild mitral valve  regurgitation. No evidence of mitral stenosis.   5. The aortic valve is normal in structure. Aortic valve regurgitation is  not visualized. No aortic stenosis is present.   6. The inferior vena cava is normal in size with greater than 50%  respiratory variability, suggesting right atrial pressure of 3 mmHg.     Neuro/Psych  PSYCHIATRIC DISORDERS      negative neurological ROS     GI/Hepatic negative GI ROS, Neg liver ROS,,,  Endo/Other   Pre-diabetes  Renal/GU negative Renal ROS     Musculoskeletal  (+) Arthritis ,    Abdominal   Peds  Hematology negative hematology ROS (+) Lab Results      Component                Value               Date                      WBC                      8.5                 03/10/2024                HGB                      13.5                03/10/2024                HCT                      41.3                03/10/2024                MCV                      96  03/10/2024                PLT                      271                 03/10/2024              Anesthesia Other Findings Last Eliquis : this morning  Reproductive/Obstetrics                              Anesthesia Physical Anesthesia Plan  ASA: 3  Anesthesia Plan: General   Post-op Pain Management: Minimal or no pain anticipated   Induction: Intravenous  PONV Risk Score and Plan: 3 and Treatment may vary due to age or medical condition  Airway Management Planned: Natural Airway and Nasal Cannula  Additional Equipment:   Intra-op Plan:   Post-operative Plan:   Informed Consent: I have reviewed the patients History and Physical, chart, labs and discussed the procedure including the risks, benefits and alternatives for the proposed anesthesia with the patient or authorized representative who has indicated his/her understanding and acceptance.     Dental advisory given  Plan Discussed with: Anesthesiologist and CRNA  Anesthesia Plan Comments: (Risks of general anesthesia discussed including, but not limited to, sore throat, hoarse voice, chipped/damaged teeth, injury to vocal cords, nausea and vomiting, allergic reactions, lung infection, heart attack, stroke, and death. All questions answered. )         Anesthesia Quick Evaluation

## 2024-03-14 NOTE — Interval H&P Note (Signed)
 History and Physical Interval Note:  03/14/2024 10:54 AM  Kerry Coleman  has presented today for surgery, with the diagnosis of AFIB.  The various methods of treatment have been discussed with the patient and family. After consideration of risks, benefits and other options for treatment, the patient has consented to  Procedure(s): CARDIOVERSION (N/A) as a surgical intervention.  The patient's history has been reviewed, patient examined, no change in status, stable for surgery.  I have reviewed the patient's chart and labs.  Questions were answered to the patient's satisfaction.     [name]

## 2024-03-15 ENCOUNTER — Telehealth: Payer: Self-pay | Admitting: Cardiology

## 2024-03-15 ENCOUNTER — Encounter (HOSPITAL_COMMUNITY): Payer: Self-pay | Admitting: Cardiology

## 2024-03-15 NOTE — Telephone Encounter (Signed)
 Appt made with DOD tomorrow, currently do not have opening for nurse visit. Pt agrees to hold off on placing ZIO until seen tomorrow.

## 2024-03-15 NOTE — Telephone Encounter (Signed)
 Patient wants a call back directly from RN Brooke regarding follow-up on her procedure and noted her heart monitor will not arrive until tomorrow (9/25).

## 2024-03-15 NOTE — Telephone Encounter (Signed)
 I wonder if she is already back in A-fib.  If possible, can we do an EKG in the office today?.  If EKG shows A-fib, she may not need monitor.  Thanks MJP

## 2024-03-15 NOTE — Telephone Encounter (Signed)
 Thank you.  Copying my note from last time as FYI for Dr. Michele.  Thank you for seeing her.  Persistent A-fib: Successful cardioversion 10/2023 with return to A-fib. Challenge has been trying to figure out the etiology of her symptoms, especially fatigue. It is quite possible that fatigue is related to A-fib. Before subjecting her to antiarrhythmic therapy, we mutually decided on the following plan: Will perform cardioversion to see if her symptoms improve while she is out of A-fib.  It is quite likely that she would return to A-fib even after successful cardioversion.  She will wear a 2-week monitor after cardioversion to assess for A-fib burden post cardioversion.  That will help us  decide on further decision making whether she would benefit from rhythm control therapy.  At her age, it is less likely that ablation would be opted for, but could need antiarrhythmic medications at that time. Emphasized on the importance of continuation of anticoagulation with Eliquis  5 mg twice daily. Continue metoprolol , diltiazem .    Thanks MJP

## 2024-03-15 NOTE — Telephone Encounter (Signed)
 Spoke to patient and she reports that she started to have weakness and feeling shaky this morning. Checked blood pressure and heart rate. BP 114/50 and HR 70. Cardioversion was on 9/23. Pt should have zio monitor by mail tomorrow to be started. Pt reports that she is taking her medications as prescribed, staying hydrated, and eating. Pt advised to continue to monitor vitals and symptoms. ER precautions reviewed with  patient. Pt agrees with plan of care.

## 2024-03-16 ENCOUNTER — Ambulatory Visit: Attending: Cardiology | Admitting: Cardiology

## 2024-03-16 ENCOUNTER — Encounter: Payer: Self-pay | Admitting: Cardiology

## 2024-03-16 VITALS — BP 108/62 | HR 68 | Resp 16 | Ht 65.0 in | Wt 162.8 lb

## 2024-03-16 DIAGNOSIS — Z7901 Long term (current) use of anticoagulants: Secondary | ICD-10-CM | POA: Diagnosis present

## 2024-03-16 DIAGNOSIS — I4819 Other persistent atrial fibrillation: Secondary | ICD-10-CM | POA: Insufficient documentation

## 2024-03-16 DIAGNOSIS — I10 Essential (primary) hypertension: Secondary | ICD-10-CM | POA: Insufficient documentation

## 2024-03-16 MED ORDER — CHLORTHALIDONE 25 MG PO TABS: 12.5000 mg | ORAL_TABLET | Freq: Every morning | ORAL | 3 refills | Status: DC

## 2024-03-16 MED ORDER — LOSARTAN POTASSIUM 25 MG PO TABS: 25.0000 mg | ORAL_TABLET | Freq: Every evening | ORAL | Status: DC

## 2024-03-16 NOTE — Progress Notes (Signed)
 Cardiology Office Note:  .   Date:  03/16/2024  ID:  Kerry Coleman, DOB 10-01-1934, MRN [id number] PCP:  Kerry Elsie Sayre, MD  Cardiologist:  Kerry [name], MD  Click to update primary MD,subspecialty MD or APP then REFRESH:1}    Chief Complaint  Patient presents with   Atrial Fibrillation   Follow-up    History of Present Illness: Kerry Coleman   Kerry Coleman is a 89 y.o.  female whose past medical history and cardiovascular risk factors includes: Persistent atrial fibrillation status post cardioversion, hypertension, hyperlipidemia.  Patient is being seen on a DOD day due to concerns for ERAF status post cardioversion.  Patient follows up with Dr. Lawrence on a regular basis.  Patient reached out 03/15/2024 due to concerns for weakness and feeling shaky.  She has history of persistent atrial fibrillation and underwent cardioversion on 03/14/2024 at which time she converted to normal sinus rhythm after 300 J x 1.  The initial plan was to proceed forward with a Zio patch after cardioversion to reevaluate A-fib burden.  Depending on the A-fib burden could consider the role of antiarrhythmics for rhythm control strategies  Since her cardioversion patient states that she has been having episodes of feeling tired, fatigued, lightheadedness and is concerned that she may be back in A-fib.  She is accompanied by her husband at today's office visit.  She denies any anginal chest pain or heart failure symptoms.   Review of Systems: .   Review of Systems  Cardiovascular:  Negative for chest pain, claudication, irregular heartbeat, leg swelling, near-syncope, orthopnea, palpitations, paroxysmal nocturnal dyspnea and syncope.  Respiratory:  Negative for shortness of breath.   Hematologic/Lymphatic: Negative for bleeding problem.  Neurological:  Positive for light-headedness.    Studies Reviewed:   EKG: EKG Interpretation Date/Time:  Thursday March 16 2024 15:30:28 EDT Ventricular Rate:   79 PR Interval:  170 QRS Duration:  114 QT Interval:  392 QTC Calculation: 449 R Axis:   -70  Text Interpretation: Sinus rhythm with marked sinus arrhythmia Left axis deviation Moderate voltage criteria for LVH, may be normal variant ( R in aVL , Cornell product ) Consider Anterior infarct , age undetermined When compared with ECG of 14-Mar-2024 11:50, Premature atrial complexes are no longer Present Confirmed by Michele Richardson [phone]) on 03/16/2024 3:36:43 PM  Risk Assessment/Calculations:   Click Here to Calculate/Change CHADS2VASc Score The patient's CHADS2-VASc score is 4, indicating a 4.8% annual risk of stroke.   CHF History: No HTN History: Yes Diabetes History: No Stroke History: No Vascular Disease History: No  Labs:       Latest Ref Rng & Units 03/10/2024    2:43 PM 12/16/2023    9:56 AM 10/29/2023    9:08 AM  CBC  WBC 3.4 - 10.8 x10E3/uL 8.5  7.8    Hemoglobin 11.1 - 15.9 g/dL 86.4  85.1  86.0   Hematocrit 34.0 - 46.6 % 41.3  45.3  41.0   Platelets 150 - 450 x10E3/uL 271  286         Latest Ref Rng & Units 03/10/2024    2:43 PM 12/16/2023    9:56 AM 11/16/2023    2:30 PM  BMP  Glucose 70 - 99 mg/dL 91  95  87   BUN 8 - 27 mg/dL 20  21  11    Creatinine 0.57 - 1.00 mg/dL 9.28  9.24  9.30   BUN/Creat Ratio 12 - 28 28   16    Sodium 134 -  144 mmol/L 141  139  142   Potassium 3.5 - 5.2 mmol/L 3.9  3.8  4.2   Chloride 96 - 106 mmol/L 101  101  99   CO2 20 - 29 mmol/L 24  27  25    Calcium  8.7 - 10.3 mg/dL 89.6  89.4  89.1       Latest Ref Rng & Units 03/10/2024    2:43 PM 12/16/2023    9:56 AM 11/16/2023    2:30 PM  CMP  Glucose 70 - 99 mg/dL 91  95  87   BUN 8 - 27 mg/dL 20  21  11    Creatinine 0.57 - 1.00 mg/dL 9.28  9.24  9.30   Sodium 134 - 144 mmol/L 141  139  142   Potassium 3.5 - 5.2 mmol/L 3.9  3.8  4.2   Chloride 96 - 106 mmol/L 101  101  99   CO2 20 - 29 mmol/L 24  27  25    Calcium  8.7 - 10.3 mg/dL 89.6  89.4  89.1     Lab Results  Component Value  Date   CHOL 213 (H) 12/23/2022   HDL 78.80 12/23/2022   LDLCALC 119 (H) 12/23/2022   LDLDIRECT 86.0 09/27/2023   TRIG 74.0 12/23/2022   CHOLHDL 3 12/23/2022   No results for input(s): LIPOA in the last 8760 hours. No components found for: NTPROBNP No results for input(s): PROBNP in the last 8760 hours. No results for input(s): TSH in the last 8760 hours.  Physical Exam:    Today's Vitals   03/16/24 1527  BP: 108/62  Pulse: 68  Resp: 16  SpO2: 95%  Weight: 162 lb 12.8 oz (73.8 kg)  Height: 5' 5 (1.651 m)   Body mass index is 27.09 kg/m. Wt Readings from Last 3 Encounters:  03/16/24 162 lb 12.8 oz (73.8 kg)  03/14/24 158 lb (71.7 kg)  03/10/24 163 lb 4.8 oz (74.1 kg)    Physical Exam  Constitutional: No distress. She appears chronically ill.  hemodynamically stable  Neck: No JVD present.  Cardiovascular: Normal rate, regular rhythm, S1 normal and S2 normal. Exam reveals no gallop, no S3 and no S4.  No murmur heard. Pulmonary/Chest: Effort normal and breath sounds normal. No stridor. She has no wheezes. She has no rales.  Musculoskeletal:        General: No edema.     Cervical back: Neck supple.  Skin: Skin is warm.     Impression & Recommendation(s):  Impression:   ICD-10-CM   1. Persistent atrial fibrillation (HCC)  I48.19 EKG 12-Lead    2. Long term (current) use of anticoagulants  Z79.01     3. Essential hypertension  I10        Recommendation(s):  Persistent atrial fibrillation (HCC) Rate control: Toprol -XL and Cardizem  Rhythm control: N/A. Thromboembolic prophylaxis: Eliquis  Status post direct-current cardioversion 03/14/2024: 300 J x 1 EKG today illustrates sinus rhythm. Recommended that she continues with the original plan with the Zio patch to evaluate for A-fib burden. Based on the results her primary cardiologist Dr. Elmira will formulate a plan if antiarrhythmics are needed to maintain sinus rhythm Plan of care discussed with  patient and husband at today's visit  Long term (current) use of anticoagulants Does not endorse evidence of bleeding. Indication: Persistent atrial fibrillation. Risks, benefits, and alternatives to anticoagulation discussed.  Essential hypertension Office blood pressures are soft. Upon further review patient takes all of her blood pressure medications in the morning  with the exception of Toprol -XL at night. I reviewed her home blood pressure log and SBP ranges between 100-120 mmHg Given the fact that she feels tired fatigue and lightheadedness recommend down titration of blood pressure medications. Currently on chlorthalidone  25 mg p.o. every morning, will transition to 12.5 mg p.o. every morning She is on losartan  25 mg p.o. every morning, recommended that she takes it at night.  Orders Placed:  Orders Placed This Encounter  Procedures   EKG 12-Lead     Final Medication List:    Meds ordered this encounter  Medications   chlorthalidone  (HYGROTON ) 25 MG tablet    Sig: Take 0.5 tablets (12.5 mg total) by mouth in the morning.    Dispense:  45 tablet    Refill:  3    Discontinue previous prescription, patient will call you when she needs it   losartan  (COZAAR ) 25 MG tablet    Sig: Take 1 tablet (25 mg total) by mouth every evening.    Medications Discontinued During This Encounter  Medication Reason   chlorthalidone  (HYGROTON ) 25 MG tablet Dose change   losartan  (COZAAR ) 25 MG tablet Reorder     Current Outpatient Medications:    calcium  carbonate (OS-CAL) 600 MG tablet, Take 1 tablet (600 mg total) by mouth 2 (two) times daily., Disp: 180 tablet, Rfl: 4   chlorthalidone  (HYGROTON ) 25 MG tablet, Take 0.5 tablets (12.5 mg total) by mouth in the morning., Disp: 45 tablet, Rfl: 3   Cholecalciferol (VITAMIN D3) 50 MCG (2000 UT) capsule, Take 2,000 Units by mouth daily., Disp: , Rfl:    diltiazem  (CARDIZEM  CD) 120 MG 24 hr capsule, Take 1 capsule (120 mg total) by mouth daily.,  Disp: 90 capsule, Rfl: 1   ELIQUIS  5 MG TABS tablet, TAKE 1 TABLET(5 MG) BY MOUTH TWICE DAILY, Disp: 60 tablet, Rfl: 3   gabapentin  (NEURONTIN ) 300 MG capsule, Take 300 mg by mouth at bedtime as needed (back pain)., Disp: , Rfl:    ibuprofen (ADVIL) 200 MG tablet, Take 200 mg by mouth as needed for moderate pain (pain score 4-6)., Disp: , Rfl:    metoprolol  succinate (TOPROL -XL) 50 MG 24 hr tablet, Take 1 tablet (50 mg total) by mouth daily., Disp: 90 tablet, Rfl: 3   Multiple Vitamins-Minerals (CENTRUM SILVER 50+WOMEN PO), Take 1 tablet by mouth every morning., Disp: , Rfl:    Polyethyl Glycol-Propyl Glycol (LUBRICANT EYE DROPS) 0.4-0.3 % SOLN, Place 1-2 drops into both eyes 3 (three) times daily as needed (dry/irritated eyes.)., Disp: , Rfl:    rosuvastatin  (CRESTOR ) 20 MG tablet, TAKE 1 TABLET(20 MG) BY MOUTH DAILY, Disp: 90 tablet, Rfl: 3   losartan  (COZAAR ) 25 MG tablet, Take 1 tablet (25 mg total) by mouth every evening., Disp: , Rfl:   Consent:   NA  Disposition:   As originally scheduled with Dr. Elmira  Her questions and concerns were addressed to her satisfaction. She voices understanding of the recommendations provided during this encounter.    Signed, Madonna Michele HAS, [facility]  A Division of [facility] [address] [place], [place] [address]  03/16/2024 4:17 PM

## 2024-03-16 NOTE — Patient Instructions (Addendum)
 Medication Instructions:    Decrease Chlorthalidone  12.5 mg  ( 1/2 tablet of 25 mg)  in the morning    Start taking Losartan  25 mg in the evening    *If you need a refill on your cardiac medications before your next appointment, please call your pharmacy*   Lab Work: Not needed    Testing/Procedures: Not  needed    Continue to wear your monitor for 2 weeks   Follow-Up: At [facility], you and your health needs are our priority.  As part of our continuing mission to provide you with exceptional heart care, we have created designated Provider Care Teams.  These Care Teams include your primary Cardiologist (physician) and Advanced Practice Providers (APPs -  Physician Assistants and Nurse Practitioners) who all work together to provide you with the care you need, when you need it.     Your next appointment:   Keep your appt with Dr Elmira   The format for your next appointment:   In Person  Provider:   Newman JINNY Elmira, MD

## 2024-03-16 NOTE — Telephone Encounter (Signed)
 Forwarded you the note.   ST

## 2024-03-17 NOTE — Telephone Encounter (Signed)
Thanks MJP  

## 2024-03-30 ENCOUNTER — Ambulatory Visit: Payer: Self-pay | Admitting: Family Medicine

## 2024-03-30 ENCOUNTER — Encounter: Payer: Self-pay | Admitting: Family Medicine

## 2024-03-30 ENCOUNTER — Ambulatory Visit (INDEPENDENT_AMBULATORY_CARE_PROVIDER_SITE_OTHER): Admitting: Family Medicine

## 2024-03-30 VITALS — BP 112/70 | HR 75 | Temp 97.5°F | Ht 65.0 in | Wt 160.8 lb

## 2024-03-30 DIAGNOSIS — Z23 Encounter for immunization: Secondary | ICD-10-CM | POA: Diagnosis not present

## 2024-03-30 DIAGNOSIS — E7800 Pure hypercholesterolemia, unspecified: Secondary | ICD-10-CM | POA: Diagnosis not present

## 2024-03-30 DIAGNOSIS — R7303 Prediabetes: Secondary | ICD-10-CM | POA: Diagnosis not present

## 2024-03-30 LAB — HEMOGLOBIN A1C: Hgb A1c MFr Bld: 5.9 % (ref 4.6–6.5)

## 2024-03-30 LAB — URINALYSIS, ROUTINE W REFLEX MICROSCOPIC
Bilirubin Urine: NEGATIVE
Hgb urine dipstick: NEGATIVE
Ketones, ur: NEGATIVE
Leukocytes,Ua: NEGATIVE
Nitrite: NEGATIVE
RBC / HPF: NONE SEEN (ref 0–?)
Specific Gravity, Urine: 1.015 (ref 1.000–1.030)
Total Protein, Urine: NEGATIVE
Urine Glucose: NEGATIVE
Urobilinogen, UA: 0.2 (ref 0.0–1.0)
pH: 7.5 (ref 5.0–8.0)

## 2024-03-30 LAB — LDL CHOLESTEROL, DIRECT: Direct LDL: 85 mg/dL

## 2024-03-30 NOTE — Progress Notes (Signed)
 Established Patient Office Visit   Subjective:  Patient ID: Kerry Coleman, female    DOB: 1934/09/28  Age: 89 y.o. MRN: [id number]  Chief Complaint  Patient presents with   Medical Management of Chronic Issues    3 month follow up. Pt is not fasting. Finished with her heart monitor on yesterday. Pt received flu shot on yesterday waiting for records from pharmacy.     HPI Encounter Diagnoses  Name Primary?   Elevated cholesterol Yes   Prediabetes    Immunization due    Doing relatively well but recovery is gradual after recent back surgery.  She is walking and lifting hand weights.  Blood pressure well-controlled with current therapy.  Continues Eliquis  for history of A-fib.  Prediabetes remains diet controlled.  Continues rosuvastatin  for elevated cholesterol.  Will be moving to [facility].  No problems with urination.    Review of Systems  Constitutional: Negative.   HENT: Negative.    Eyes:  Negative for blurred vision, discharge and redness.  Respiratory: Negative.    Cardiovascular: Negative.   Gastrointestinal:  Negative for abdominal pain.  Genitourinary: Negative.   Musculoskeletal: Negative.  Negative for myalgias.  Skin:  Negative for rash.  Neurological:  Negative for tingling, loss of consciousness and weakness.  Endo/Heme/Allergies:  Negative for polydipsia.     Current Outpatient Medications:    calcium  carbonate (OS-CAL) 600 MG tablet, Take 1 tablet (600 mg total) by mouth 2 (two) times daily., Disp: 180 tablet, Rfl: 4   chlorthalidone  (HYGROTON ) 25 MG tablet, Take 0.5 tablets (12.5 mg total) by mouth in the morning., Disp: 45 tablet, Rfl: 3   Cholecalciferol (VITAMIN D3) 50 MCG (2000 UT) capsule, Take 2,000 Units by mouth daily., Disp: , Rfl:    diltiazem  (CARDIZEM  CD) 120 MG 24 hr capsule, Take 1 capsule (120 mg total) by mouth daily., Disp: 90 capsule, Rfl: 1   ELIQUIS  5 MG TABS tablet, TAKE 1 TABLET(5 MG) BY MOUTH TWICE DAILY,  Disp: 60 tablet, Rfl: 3   gabapentin  (NEURONTIN ) 300 MG capsule, Take 300 mg by mouth at bedtime as needed (back pain)., Disp: , Rfl:    ibuprofen (ADVIL) 200 MG tablet, Take 200 mg by mouth as needed for moderate pain (pain score 4-6)., Disp: , Rfl:    losartan  (COZAAR ) 25 MG tablet, Take 1 tablet (25 mg total) by mouth every evening., Disp: , Rfl:    metoprolol  succinate (TOPROL -XL) 50 MG 24 hr tablet, Take 1 tablet (50 mg total) by mouth daily., Disp: 90 tablet, Rfl: 3   Multiple Vitamins-Minerals (CENTRUM SILVER 50+WOMEN PO), Take 1 tablet by mouth every morning., Disp: , Rfl:    Polyethyl Glycol-Propyl Glycol (LUBRICANT EYE DROPS) 0.4-0.3 % SOLN, Place 1-2 drops into both eyes 3 (three) times daily as needed (dry/irritated eyes.)., Disp: , Rfl:    rosuvastatin  (CRESTOR ) 20 MG tablet, TAKE 1 TABLET(20 MG) BY MOUTH DAILY, Disp: 90 tablet, Rfl: 3   Objective:     BP 112/70 (BP Location: Right Arm, Patient Position: Sitting, Cuff Size: Normal)   Pulse 75   Temp (!) 97.5 F (36.4 C) (Temporal)   Ht 5' 5 (1.651 m)   Wt 160 lb 12.8 oz (72.9 kg)   SpO2 98%   BMI 26.76 kg/m  BP Readings from Last 3 Encounters:  03/30/24 112/70  03/16/24 108/62  03/14/24 133/61   Wt Readings from Last 3 Encounters:  03/30/24 160 lb 12.8 oz (72.9 kg)  03/16/24 162 lb  12.8 oz (73.8 kg)  03/14/24 158 lb (71.7 kg)      Physical Exam Constitutional:      General: She is not in acute distress.    Appearance: Normal appearance. She is not ill-appearing, toxic-appearing or diaphoretic.  HENT:     Head: Normocephalic and atraumatic.     Right Ear: External ear normal.     Left Ear: External ear normal.     Mouth/Throat:     Mouth: Mucous membranes are moist.     Pharynx: Oropharynx is clear. No oropharyngeal exudate or posterior oropharyngeal erythema.  Eyes:     General: No scleral icterus.       Right eye: No discharge.        Left eye: No discharge.     Extraocular Movements: Extraocular  movements intact.     Conjunctiva/sclera: Conjunctivae normal.     Pupils: Pupils are equal, round, and reactive to light.  Cardiovascular:     Rate and Rhythm: Normal rate and regular rhythm.  Pulmonary:     Effort: Pulmonary effort is normal. No respiratory distress.     Breath sounds: No wheezing or rales.  Skin:    General: Skin is warm and dry.  Neurological:     Mental Status: She is alert and oriented to person, place, and time.  Psychiatric:        Mood and Affect: Mood normal.        Behavior: Behavior normal.      No results found for any visits on 03/30/24.    The ASCVD Risk score (Arnett DK, et al., 2019) failed to calculate for the following reasons:   The 2019 ASCVD risk score is only valid for ages 73 to 28    Assessment & Plan:   Elevated cholesterol -     LDL cholesterol, direct  Prediabetes -     Hemoglobin A1c -     Urinalysis, Routine w reflex microscopic  Immunization due -     Pneumococcal conjugate vaccine 20-valent    Return in about 6 months (around 09/28/2024).    Elsie Sim Lent, MD

## 2024-04-04 ENCOUNTER — Telehealth: Payer: Self-pay | Admitting: Family Medicine

## 2024-04-04 NOTE — Telephone Encounter (Signed)
 Patient dropped off document [employer] form, to be filled out by provider. Patient requested to send it back via Call Patient to pick up within 2-days. Document is located in providers tray at front office.Please advise at [facility] [phone]

## 2024-04-07 DIAGNOSIS — I4819 Other persistent atrial fibrillation: Secondary | ICD-10-CM

## 2024-04-13 NOTE — Telephone Encounter (Unsigned)
 Copied from CRM #[id number]. Topic: General - Other >> Apr 13, 2024 11:33 AM Burnard DEL wrote: Reason for CRM: Patient would like to know if provider is still working on the forms for [employer] for her and her husband? >> Apr 13, 2024 11:37 AM Burnard DEL wrote: Patient would also like to knpw has she ever brought in a copy of her living will to be scanned into her chart?

## 2024-04-19 ENCOUNTER — Telehealth: Payer: Self-pay | Admitting: Family Medicine

## 2024-04-19 NOTE — Telephone Encounter (Signed)
 Patient dropped off document Medical orders for scope of treatment & dnr, to be filled out by provider. Patient requested to send it back via within ASAP. Document is located in providers tray at front office.Please advise at [facility] [phone]

## 2024-04-26 NOTE — Telephone Encounter (Signed)
 Copied from CRM [id number]. Topic: General - Other >> Apr 26, 2024  9:59 AM Rosina BIRCH wrote: Reason for CRM: patient called wanting to know if her living will forms were completed for her and her husband. They have to turn it in to where they live [phone]

## 2024-04-26 NOTE — Telephone Encounter (Signed)
Patient following up on forms. Please advise.

## 2024-04-27 ENCOUNTER — Ambulatory Visit: Attending: Cardiovascular Disease | Admitting: Cardiology

## 2024-04-27 ENCOUNTER — Encounter: Payer: Self-pay | Admitting: Cardiology

## 2024-04-27 VITALS — BP 110/50 | HR 80 | Ht 65.0 in | Wt 159.0 lb

## 2024-04-27 DIAGNOSIS — I471 Supraventricular tachycardia, unspecified: Secondary | ICD-10-CM | POA: Insufficient documentation

## 2024-04-27 DIAGNOSIS — I4819 Other persistent atrial fibrillation: Secondary | ICD-10-CM | POA: Diagnosis present

## 2024-04-27 DIAGNOSIS — E782 Mixed hyperlipidemia: Secondary | ICD-10-CM | POA: Insufficient documentation

## 2024-04-27 DIAGNOSIS — I4729 Other ventricular tachycardia: Secondary | ICD-10-CM | POA: Insufficient documentation

## 2024-04-27 DIAGNOSIS — I10 Essential (primary) hypertension: Secondary | ICD-10-CM | POA: Insufficient documentation

## 2024-04-27 MED ORDER — LOSARTAN POTASSIUM 25 MG PO TABS: 25.0000 mg | ORAL_TABLET | Freq: Every morning | ORAL | Status: AC

## 2024-04-27 MED ORDER — APIXABAN 5 MG PO TABS: 5.0000 mg | ORAL_TABLET | Freq: Two times a day (BID) | ORAL | 3 refills | Status: AC

## 2024-04-27 NOTE — Telephone Encounter (Unsigned)
 Copied from CRM [id number]. Topic: General - Other >> Apr 26, 2024  9:59 AM Rosina BIRCH wrote: Reason for CRM: patient called wanting to know if her living will forms were completed for her and her husband. They have to turn it in to where they live [phone] >> Apr 27, 2024  9:08 AM Robinson H wrote: Patient following up on living will paperwork, states she has to turn in paperwork today.  [place] [phone]

## 2024-04-27 NOTE — Patient Instructions (Signed)
 Medication Instructions:    Stop taking chlorthalidone     Stat taking Losartan   25 mg in the morning   Refill done  *If you need a refill on your cardiac medications before your next appointment, please call your pharmacy*   Lab Work:  If you have labs (blood work) drawn today and your tests are completely normal, you will receive your results only by: MyChart Message (if you have MyChart) OR A paper copy in the mail If you have any lab test that is abnormal or we need to change your treatment, we will call you to review the results.   Testing/Procedures: Not needed   Follow-Up: At [facility], you and your health needs are our priority.  As part of our continuing mission to provide you with exceptional heart care, we have created designated Provider Care Teams.  These Care Teams include your primary Cardiologist (physician) and Advanced Practice Providers (APPs -  Physician Assistants and Nurse Practitioners) who all work together to provide you with the care you need, when you need it.     Your next appointment:   6 month(s)  The format for your next appointment:   In Person  Provider:   Newman [name], MD

## 2024-04-27 NOTE — Progress Notes (Signed)
 Cardiology Office Note:  .   Date:  04/27/2024  ID:  Kerry Coleman, DOB 1935/02/14, MRN [id number] PCP: Berneta Elsie Sayre, MD  [facility] Providers Cardiologist:  Newman Lawrence, MD PCP: Berneta Elsie Sayre, MD  Chief Complaint  Patient presents with   Atrial Fibrillation     Kerry Coleman is a 89 y.o. female with hypertension, hyperlipidemia, persistent A-fib, PSVT, NSVT  History of Present Illness  Patient is here today with her husband and daughter-in-law.  She is feeling better recently, has not noticed any palpitations.  Fatigue has improved, but not resolved.  She has occasions of lightheadedness and nausea.  Blood pressure is on the lower side.  Reviewed recent monitor results with the patient family just flew.  ncluding Eliquis .    Vitals:   04/27/24 0800  BP: (!) 110/50  Pulse: 80  SpO2: 99%       Review of Systems  Constitutional: Positive for malaise/fatigue.  Cardiovascular:  Negative for chest pain, dyspnea on exertion, leg swelling, palpitations and syncope.  Neurological:  Positive for light-headedness (Occasional).        Studies Reviewed: SABRA        EKG 04/27/2024: Normal sinus rhythm Left axis deviation Left ventricular hypertrophy ( R in aVL , Cornell product , Romhilt-Estes ) When compared with ECG of 16-Mar-2024 15:30, No significant change was found  Zio patch monitor 13 days 03/16/2024 - 03/30/2024: Dominant rhythm: Sinus. HR 51-73 bpm. Avg HR 125 bpm, in sinus rhythm. 100 episodes of SVT/atrial tachycardia, fastest at 190 bpm for 8.1 secs, longest for 13.2 secs at 125 bpm. 2.2% isolated SVE, <1% couplet/triplets. 5 episodes of VT, fastest at 174 bpm for 4 beats, longest for 17 beats at 116 bpm. <1% isolated VE, couplet/triplets. No atrial fibrillation/atrial flutter/VT/high grade AV block, sinus pause >3sec noted. 0 patient triggered events.    Echocardiogram 11/05/2023: 1. Left ventricular ejection fraction, by  estimation, is 60 to 65%. The  left ventricle has normal function. The left ventricle has no regional  wall motion abnormalities. Left ventricular diastolic parameters were  normal.   2. Right ventricular systolic function is normal. The right ventricular  size is normal. There is normal pulmonary artery systolic pressure. The  estimated right ventricular systolic pressure is 26.8 mmHg.   3. Left atrial size was mildly dilated.   4. The mitral valve is normal in structure. Mild mitral valve  regurgitation. No evidence of mitral stenosis.   5. The aortic valve is normal in structure. Aortic valve regurgitation is  not visualized. No aortic stenosis is present.   6. The inferior vena cava is normal in size with greater than 50%  respiratory variability, suggesting right atrial pressure of 3 mmHg.   Labs 02/2024: Direct LDL 85 HbA1C 5.9% Hb 13.5 Cr 0.71   Risk Assessment/Calculations:    CHA2DS2-VASc Score = 4   This indicates a 4.8% annual risk of stroke. The patient's score is based upon: CHF History: 0 HTN History: 1 Diabetes History: 0 Stroke History: 0 Vascular Disease History: 0 Age Score: 2 Gender Score: 1     Physical Exam Vitals and nursing note reviewed.  Constitutional:      General: She is not in acute distress. Neck:     Vascular: No JVD.  Cardiovascular:     Rate and Rhythm: Normal rate. Rhythm irregular.     Heart sounds: Normal heart sounds. No murmur heard. Pulmonary:     Effort: Pulmonary effort is normal.  Breath sounds: Normal breath sounds. No wheezing or rales.  Musculoskeletal:     Right lower leg: No edema.     Left lower leg: No edema.      VISIT DIAGNOSES:   ICD-10-CM   1. Persistent atrial fibrillation (HCC)  I48.19 EKG 12-Lead    2. Primary hypertension  I10 EKG 12-Lead    3. PSVT (paroxysmal supraventricular tachycardia)  I47.10     4. NSVT (nonsustained ventricular tachycardia) (HCC)  I47.29     5. Mixed hyperlipidemia   E78.2         Kerry Coleman is a 89 y.o. female with hypertension, hyperlipidemia, persistent A-fib, PSVT, NSVT Assessment & Plan  Persistent A-fib: Recurrence of A-fib since cardioversion 5/25, but has recently resolved.   Maintaining sinus rhythm.   No A-fib seen on monitor in 03/2024. Few episodes of SVT and NSVT, but no significant symptoms associated with it. Recommend continue metoprolol  succinate 50 mg daily, diltiazem  120 mg daily. Continue Eliquis  5 mg twice daily.  Hypertension: Low normal blood pressure with occasional lightheadedness. I will stop chlorthalidone . Continue losartan , metoprolol  and diltiazem  for now. In future, if blood pressure continues to run lower, and fatigue is persistent, we could consider reducing metoprolol  and increasing diltiazem  dose instead.   Mixed hyperlipidemia: LDL 85 on Crestor  20 mg daily.  Continue the same.  F/u in 6 months  Signed, Newman [name], MD

## 2024-05-10 ENCOUNTER — Telehealth: Payer: Self-pay | Admitting: Family Medicine

## 2024-05-10 NOTE — Telephone Encounter (Signed)
 Form placed in providers box to be signed upon returning. Dm/cma

## 2024-05-10 NOTE — Telephone Encounter (Signed)
 Patient dropped off document swim program form, to be filled out by provider. Patient requested to send it back via Call Patient to pick up within ASAP. Document is located in providers tray at front office.Please advise at [facility] [phone]

## 2024-05-11 ENCOUNTER — Other Ambulatory Visit: Payer: Self-pay | Admitting: Cardiology

## 2024-05-30 ENCOUNTER — Ambulatory Visit

## 2024-05-30 DIAGNOSIS — M7918 Myalgia, other site: Secondary | ICD-10-CM | POA: Insufficient documentation

## 2024-05-30 DIAGNOSIS — M5459 Other low back pain: Secondary | ICD-10-CM | POA: Diagnosis present

## 2024-05-30 NOTE — Therapy (Signed)
 OUTPATIENT PHYSICAL THERAPY THORACOLUMBAR EVALUATION   Patient Name: Kerry Coleman MRN: [id number] DOB:[birth year], 89 y.o., female Today's Date: 05/30/2024  END OF SESSION:  PT End of Session - 05/30/24 1457     Visit Number 1    Date for Recertification  08/08/24    Authorization Type Medicare    PT Start Time 1500    PT Stop Time 1540    PT Time Calculation (min) 40 min    Activity Tolerance Patient tolerated treatment well    Behavior During Therapy [facility] for tasks assessed/performed         Past Medical History:  Diagnosis Date   Arthritis    Atrial fibrillation (HCC)    HLD (hyperlipidemia)    HTN (hypertension)    Past Surgical History:  Procedure Laterality Date   BACK SURGERY     BACK SURGERY     10/2022   BREAST BIOPSY Left    @ age 77, Benign    BUNIONECTOMY     CARDIOVERSION N/A 10/29/2023   Procedure: CARDIOVERSION;  Surgeon: Raford Riggs, MD;  Location: [facility] INVASIVE CV LAB;  Service: Cardiovascular;  Laterality: N/A;   CARDIOVERSION N/A 03/14/2024   Procedure: CARDIOVERSION;  Surgeon: Pietro Redell RAMAN, MD;  Location: [facility] INVASIVE CV LAB;  Service: Cardiovascular;  Laterality: N/A;   CARPAL TUNNEL RELEASE Left    KNEE SURGERY     LAMINECTOMY WITH POSTERIOR LATERAL ARTHRODESIS LEVEL 1 Left 12/27/2023   Procedure: Posterior lateral fusion - Lumbar two-three, left Lumbar two-three hemifacetectomy, extension of instrumentation Lumbar two-three;  Surgeon: Joshua Alm Hamilton, MD;  Location: [facility] OR;  Service: Neurosurgery;  Laterality: Left;   right carpal tunnel release     right index finger excision of mass     ROTATOR CUFF REPAIR Left    SPINE SURGERY     TONSILLECTOMY     TOTAL KNEE ARTHROPLASTY Bilateral    Patient Active Problem List   Diagnosis Date Noted   PSVT (paroxysmal supraventricular tachycardia) 04/27/2024   NSVT (nonsustained ventricular tachycardia) (HCC) 04/27/2024   Mixed hyperlipidemia 04/27/2024   S/P lumbar fusion 12/27/2023    Prediabetes 12/10/2023   Chronic atrial fibrillation (HCC) 12/10/2023   Persistent atrial fibrillation (HCC) 10/06/2023   Hypercoagulable state due to persistent atrial fibrillation (HCC) 10/06/2023   Elevated glucose 03/25/2023   Chronic left-sided low back pain 06/24/2022   Worries 09/18/2021   Stressful life events affecting family and household 06/17/2021   Urge incontinence 09/26/2020   Tremor of left hand 09/26/2020   Stress incontinence of urine 10/26/2019   Vitamin D  deficiency 10/26/2019   Osteopenia of lumbar spine 05/08/2019   History of melanoma 04/27/2019   Preop cardiovascular exam 04/27/2019   Estrogen deficiency 04/27/2019   Urinary tract infection with hematuria 03/31/2019   Urinary tract infection without hematuria 03/31/2019   Abnormal urinalysis 03/23/2019   Elevated cholesterol 03/20/2019   Primary hypertension 03/20/2019    PCP: Berneta Elsie Sayre, MD  REFERRING PROVIDER: Joshua Alm Hamilton, MD  REFERRING DIAG: LBP  Rationale for Evaluation and Treatment: Rehabilitation  THERAPY DIAG:  Other low back pain  Gluteal pain  ONSET DATE: May 2025  SUBJECTIVE:  SUBJECTIVE STATEMENT: I have pain in my lower back that radiates a little into my hips but has been pretty central to my lower back straight across. I still have been able to swim and do light activity I am just cautious in my movements; I don't lift heavy things or bend to do much. I have been working on my balance as well and haven't had any falls in the past year.   PERTINENT HISTORY:  See PMH chart above   PAIN:  Are you having pain? Yes: NPRS scale: 4/10 Pain location: Bilateral superior gluteal  Pain description: discomfort, ache  Aggravating factors: Prolonged standing  Relieving factors: Heat; knees  elevated when lying down   PRECAUTIONS: None  RED FLAGS: None   WEIGHT BEARING RESTRICTIONS: No  FALLS:  Has patient fallen in last 6 months? No  LIVING ENVIRONMENT: Lives with: lives with their family Lives in: House/apartment Stairs: Doesn't have to use stairs on the regular  Has following equipment at home: None  OCCUPATION: Retired   PLOF: Independent with basic ADLs  PATIENT GOALS: To be able to dance again and move with better quality without pain   NEXT MD VISIT: January 2026   OBJECTIVE:  Note: Objective measures were completed at Evaluation unless otherwise noted.  DIAGNOSTIC FINDINGS:  Teresa Bottcher I, RT on 12/27/2023 12:41 PM  Surgery: Posterior lateral fusion - L2-L3, left L2-3 hemifacetectomy, extension of instrumentation L2-3  RSTO: BIW    COGNITION: Overall cognitive status: Within functional limits for tasks assessed     SENSATION: WFL   PALPATION: Superior gluteal tightness R and L side   LUMBAR ROM:   AROM eval  Flexion To mid shin with pain   Extension WNL  Right lateral flexion To knee joint with pain   Left lateral flexion To knee joint with pain   Right rotation WNL no pain   Left rotation WNL no pain    (Blank rows = not tested)  LOWER EXTREMITY ROM:     WFL  LOWER EXTREMITY MMT:    MMT Right eval Left eval  Hip flexion WNL WNL  Hip extension    Hip abduction Check next visit  Check next visit   Hip adduction Check next visit  Check next visit   Hip internal rotation    Hip external rotation    Knee flexion Check next visit  Check next visit   Knee extension Check next visit  Check next visit  Ankle dorsiflexion    Ankle plantarflexion    Ankle inversion    Ankle eversion     (Blank rows = not tested)   FUNCTIONAL TESTS:   SPPB: 7/12 Balance - 2 points  Gait Speed - 2 points (7s) Chair test - 3 points (13.5s)  GAIT: Distance walked: In Clinic  Assistive device utilized: None Level of assistance: Complete  Independence Comments: Pt with no significant gait deficits   TREATMENT DATE:   05/30/24-Eval  Trigger Point Dry Needling   Initial Treatment: Pt instructed on Dry Needling rational, procedures, and possible side effects. Pt instructed to expect mild to moderate muscle soreness later in the day and/or into the next day.  Pt instructed in methods to reduce muscle soreness. Pt instructed to continue prescribed HEP. Because Dry Needling was performed over or adjacent to a lung field, pt was educated on S/S of pneumothorax and to seek immediate medical attention should they occur.  Patient was educated on signs and symptoms of infection and other risk factors and advised to seek medical attention should they occur.  Patient verbalized understanding of these instructions and education.    Patient Verbal Consent Given: Yes Education Handout Provided: Yes Muscles Treated: bilateral upper traps Treatment Response/Outcome: LTR's Performed and documented by M. Obadiah, PT  PATIENT EDUCATION:  Education details: HEP Person educated: Patient Education method: Medical Illustrator Education comprehension: verbalized understanding and returned demonstration  HOME EXERCISE PROGRAM: Access Code: [email or web address] Date: 05/30/2024 Exercises - Standing Shoulder Row with Anchored Resistance  - 1 x daily - 7 x weekly - 2 sets - 10 reps - Shoulder extension with resistance - Neutral  - 1 x daily - 7 x weekly - 2 sets - 10 reps - Standing March with Counter Support  - 1 x daily - 7 x weekly - 2 sets - 10 reps   ASSESSMENT:  CLINICAL IMPRESSION: Patient is a 89 y.o. female who was seen today for physical therapy evaluation and treatment for lumbar spine radiculopathy into bilateral sides of hip. Pt radicular symptoms seem to be centralizing into the low back/superior gluteal  musculature across the low back. Pt with good quality ROM in back just has pain with movements of flex/ext. Pt with some deficits in higher level balance activity and will benefit from static and dynamic balance activities in PT along with light strengthening of low back musculature. Pt participated in dry needling today to alleviate muscle tightness in the superior glutes on the L and R side. Pt tolerated dry needling well with no adverse reactions and will report back in next session.   OBJECTIVE IMPAIRMENTS: decreased endurance, increased muscle spasms, and pain.   ACTIVITY LIMITATIONS: carrying, lifting, bending, and standing  PARTICIPATION LIMITATIONS: Cautious with house chores   PERSONAL FACTORS: Age, Time since onset of injury/illness/exacerbation, and 1-2 comorbidities:   are also affecting patient's functional outcome.   REHAB POTENTIAL: Good  CLINICAL DECISION MAKING: Stable/uncomplicated  EVALUATION COMPLEXITY: Low  GOALS: Goals reviewed with patient? Yes  SHORT TERM GOALS: Target date: 07/04/2024  Pt will complete full initial HEP independently  Baseline: Goal status: INITIAL   LONG TERM GOALS: Target date: 08/08/2024  Pt will be able to hold tandem stance for >15s with L and R LE in front  Baseline:  Goal status: INITIAL  2.  Pt will increase SPPB score to >9  Baseline:  Goal status: INITIAL  3.  Pt will be able to report 50% decrease in pain with carryover in quality of ADLs  Baseline:  Goal status: INITIAL    PLAN:  PT FREQUENCY: 1x/week  PT DURATION: 10 weeks  PLANNED INTERVENTIONS: 97110-Therapeutic exercises, 97530- Therapeutic activity, [id number]- Neuromuscular re-education, 97535- Self Care, [id number]- Manual therapy, [phone]- Gait training, and Patient/Family education.  PLAN FOR NEXT SESSION: Assess BLE MMT, assess how dry needling trial went, light strengthening of low back and gluteal musculature, begin static and dynamic balance activities    Thersia Alder, Student-PT 05/30/2024, 4:20 PM

## 2024-05-30 NOTE — Patient Instructions (Signed)

## 2024-06-29 ENCOUNTER — Ambulatory Visit: Attending: Family Medicine

## 2024-06-29 DIAGNOSIS — M5416 Radiculopathy, lumbar region: Secondary | ICD-10-CM | POA: Insufficient documentation

## 2024-06-29 DIAGNOSIS — M7918 Myalgia, other site: Secondary | ICD-10-CM | POA: Diagnosis present

## 2024-06-29 DIAGNOSIS — M6281 Muscle weakness (generalized): Secondary | ICD-10-CM | POA: Insufficient documentation

## 2024-06-29 DIAGNOSIS — M5459 Other low back pain: Secondary | ICD-10-CM | POA: Diagnosis present

## 2024-06-29 NOTE — Therapy (Signed)
 " OUTPATIENT PHYSICAL THERAPY THORACOLUMBAR TREATMENT   Patient Name: Kerry Coleman MRN: [id number] DOB:1935-03-08, 89 y.o., female Today's Date: 06/29/2024  END OF SESSION:  PT End of Session - 06/29/24 0921     Visit Number 2    Date for Recertification  08/08/24    Authorization Type Medicare    PT Start Time 0917    PT Stop Time 0955    PT Time Calculation (min) 38 min    Activity Tolerance Patient tolerated treatment well    Behavior During Therapy [facility] for tasks assessed/performed          Past Medical History:  Diagnosis Date   Arthritis    Atrial fibrillation (HCC)    HLD (hyperlipidemia)    HTN (hypertension)    Past Surgical History:  Procedure Laterality Date   BACK SURGERY     BACK SURGERY     10/2022   BREAST BIOPSY Left    @ age 82, Benign    BUNIONECTOMY     CARDIOVERSION N/A 10/29/2023   Procedure: CARDIOVERSION;  Surgeon: Raford Riggs, MD;  Location: [facility] INVASIVE CV LAB;  Service: Cardiovascular;  Laterality: N/A;   CARDIOVERSION N/A 03/14/2024   Procedure: CARDIOVERSION;  Surgeon: Pietro Redell RAMAN, MD;  Location: [facility] INVASIVE CV LAB;  Service: Cardiovascular;  Laterality: N/A;   CARPAL TUNNEL RELEASE Left    KNEE SURGERY     LAMINECTOMY WITH POSTERIOR LATERAL ARTHRODESIS LEVEL 1 Left 12/27/2023   Procedure: Posterior lateral fusion - Lumbar two-three, left Lumbar two-three hemifacetectomy, extension of instrumentation Lumbar two-three;  Surgeon: Joshua Alm Hamilton, MD;  Location: [facility] OR;  Service: Neurosurgery;  Laterality: Left;   right carpal tunnel release     right index finger excision of mass     ROTATOR CUFF REPAIR Left    SPINE SURGERY     TONSILLECTOMY     TOTAL KNEE ARTHROPLASTY Bilateral    Patient Active Problem List   Diagnosis Date Noted   PSVT (paroxysmal supraventricular tachycardia) 04/27/2024   NSVT (nonsustained ventricular tachycardia) (HCC) 04/27/2024   Mixed hyperlipidemia 04/27/2024   S/P lumbar fusion 12/27/2023    Prediabetes 12/10/2023   Chronic atrial fibrillation (HCC) 12/10/2023   Persistent atrial fibrillation (HCC) 10/06/2023   Hypercoagulable state due to persistent atrial fibrillation (HCC) 10/06/2023   Elevated glucose 03/25/2023   Chronic left-sided low back pain 06/24/2022   Worries 09/18/2021   Stressful life events affecting family and household 06/17/2021   Urge incontinence 09/26/2020   Tremor of left hand 09/26/2020   Stress incontinence of urine 10/26/2019   Vitamin D  deficiency 10/26/2019   Osteopenia of lumbar spine 05/08/2019   History of melanoma 04/27/2019   Preop cardiovascular exam 04/27/2019   Estrogen deficiency 04/27/2019   Urinary tract infection with hematuria 03/31/2019   Urinary tract infection without hematuria 03/31/2019   Abnormal urinalysis 03/23/2019   Elevated cholesterol 03/20/2019   Primary hypertension 03/20/2019    PCP: Berneta Elsie Sayre, MD  REFERRING PROVIDER: Joshua Alm Hamilton, MD  REFERRING DIAG: LBP  Rationale for Evaluation and Treatment: Rehabilitation  THERAPY DIAG:  Other low back pain  Muscle weakness (generalized)  Gluteal pain  Radiculopathy, lumbar region  ONSET DATE: May 2025  SUBJECTIVE:  SUBJECTIVE STATEMENT:  Pt reports some pain in her low back and hip today 4/10; states R side feels a little better but L side still intense LBP that radiates into L hip. Pt reported she has stayed active since her last visit.     I have pain in my lower back that radiates a little into my hips but has been pretty central to my lower back straight across. I still have been able to swim and do light activity I am just cautious in my movements; I don't lift heavy things or bend to do much. I have been working on my balance as well and haven't had any  falls in the past year.   PERTINENT HISTORY:  See PMH chart above   PAIN:  Are you having pain? Yes: NPRS scale: 4/10 Pain location: Bilateral superior gluteal  Pain description: discomfort, ache  Aggravating factors: Prolonged standing  Relieving factors: Heat; knees elevated when lying down   PRECAUTIONS: None  RED FLAGS: None   WEIGHT BEARING RESTRICTIONS: No  FALLS:  Has patient fallen in last 6 months? No  LIVING ENVIRONMENT: Lives with: lives with their family Lives in: House/apartment Stairs: Doesn't have to use stairs on the regular  Has following equipment at home: None  OCCUPATION: Retired   PLOF: Independent with basic ADLs  PATIENT GOALS: To be able to dance again and move with better quality without pain   NEXT MD VISIT: January 2026   OBJECTIVE:  Note: Objective measures were completed at Evaluation unless otherwise noted.  DIAGNOSTIC FINDINGS:  Teresa Bottcher I, RT on 12/27/2023 12:41 PM  Surgery: Posterior lateral fusion - L2-L3, left L2-3 hemifacetectomy, extension of instrumentation L2-3  RSTO: BIW    COGNITION: Overall cognitive status: Within functional limits for tasks assessed     SENSATION: WFL   PALPATION: Superior gluteal tightness R and L side   LUMBAR ROM:   AROM eval  Flexion To mid shin with pain   Extension WNL  Right lateral flexion To knee joint with pain   Left lateral flexion To knee joint with pain   Right rotation WNL no pain   Left rotation WNL no pain    (Blank rows = not tested)  LOWER EXTREMITY ROM:     WFL  LOWER EXTREMITY MMT:    MMT Right eval Left eval  Hip flexion WNL WNL  Hip extension    Hip abduction 5/5 4/5  Hip adduction 4/5  4/5  Hip internal rotation    Hip external rotation    Knee flexion 5/5 5/5  Knee extension 5/5 4/5  Ankle dorsiflexion    Ankle plantarflexion    Ankle inversion    Ankle eversion     (Blank rows = not tested)  FUNCTIONAL TESTS:   SPPB: 7/12 Balance - 2  points  Gait Speed - 2 points (7s) Chair test - 3 points (13.5s)  GAIT: Distance walked: In Clinic  Assistive device utilized: None Level of assistance: Complete Independence Comments: Pt with no significant gait deficits   TREATMENT DATE:   06/27/24 Stability Ball Rollouts for low back  STS 2x10 Standing Hip Abduction 2x10  Toe taps 6 Trigger Point Dry Needling   Pt instructed on Dry Needling rational, procedures, and possible side effects. Pt instructed to expect mild to moderate muscle soreness later in the day and/or into the next day.  Pt instructed in methods to reduce muscle soreness. Pt instructed to continue prescribed HEP. Because Dry Needling was performed  over or adjacent to a lung field, pt was educated on S/S of pneumothorax and to seek immediate medical attention should they occur.  Patient was educated on signs and symptoms of infection and other risk factors and advised to seek medical attention should they occur.  Patient verbalized understanding of these instructions and education.    Patient Verbal Consent Given: Yes Education Handout Provided: Yes Muscles Treated: L gluteal  Treatment Response/Outcome: LTR's Performed and documented by M. Albright, PT    05/30/24-Eval                                                                                                                               Trigger Point Dry Needling   Initial Treatment: Pt instructed on Dry Needling rational, procedures, and possible side effects. Pt instructed to expect mild to moderate muscle soreness later in the day and/or into the next day.  Pt instructed in methods to reduce muscle soreness. Pt instructed to continue prescribed HEP. Because Dry Needling was performed over or adjacent to a lung field, pt was educated on S/S of pneumothorax and to seek immediate medical attention should they occur.  Patient was educated on signs and symptoms of infection and other risk factors and  advised to seek medical attention should they occur.  Patient verbalized understanding of these instructions and education.    Patient Verbal Consent Given: Yes Education Handout Provided: Yes Muscles Treated: bilateral upper traps Treatment Response/Outcome: LTR's Performed and documented by M. Obadiah, PT  PATIENT EDUCATION:  Education details: HEP Person educated: Patient Education method: Medical Illustrator Education comprehension: verbalized understanding and returned demonstration  HOME EXERCISE PROGRAM: Access Code: [email or web address] Date: 05/30/2024 Exercises - Standing Shoulder Row with Anchored Resistance  - 1 x daily - 7 x weekly - 2 sets - 10 reps - Shoulder extension with resistance - Neutral  - 1 x daily - 7 x weekly - 2 sets - 10 reps - Standing March with Counter Support  - 1 x daily - 7 x weekly - 2 sets - 10 reps   ASSESSMENT:  CLINICAL IMPRESSION:  Pt exhibits tightness in L gluteal today and the low back. Session focused on calming current pain with stretching and functional movements to mitigate stiffness. Pt reported some relief with initial dry needling from previous session so pt treated with dry needling again at the end of session; area focused on the L gluteal region. Pt required SBA to CGA for dynamic standing activities today pt tends to lose balance with activities that require SL stance for an increased time. Pt will benefit from continued therapy to decrease LBP an centralize radicular symptoms.    Patient is a 89 y.o. female who was seen today for physical therapy evaluation and treatment for lumbar spine radiculopathy into bilateral sides of hip. Pt radicular symptoms seem to be centralizing into the low back/superior gluteal musculature across the low back. Pt with good quality ROM in  back just has pain with movements of flex/ext. Pt with some deficits in higher level balance activity and will benefit from static and dynamic balance activities in PT  along with light strengthening of low back musculature. Pt participated in dry needling today to alleviate muscle tightness in the superior glutes on the L and R side. Pt tolerated dry needling well with no adverse reactions and will report back in next session.   OBJECTIVE IMPAIRMENTS: decreased endurance, increased muscle spasms, and pain.   ACTIVITY LIMITATIONS: carrying, lifting, bending, and standing  PARTICIPATION LIMITATIONS: Cautious with house chores   PERSONAL FACTORS: Age, Time since onset of injury/illness/exacerbation, and 1-2 comorbidities:   are also affecting patient's functional outcome.   REHAB POTENTIAL: Good  CLINICAL DECISION MAKING: Stable/uncomplicated  EVALUATION COMPLEXITY: Low  GOALS: Goals reviewed with patient? Yes  SHORT TERM GOALS: Target date: 07/04/2024  Pt will complete full initial HEP independently  Baseline: Goal status: IN PROGRESS ~50% performed 06/29/24   LONG TERM GOALS: Target date: 08/08/2024  Pt will be able to hold tandem stance for >15s with L and R LE in front  Baseline:  Goal status: IN PROGRESS 06/29/24  2.  Pt will increase SPPB score to >9  Baseline:  Goal status: INITIAL  3.  Pt will be able to report 50% decrease in pain with carryover in quality of ADLs  Baseline:  Goal status: IN PROGRESS 4/10 06/29/24    PLAN:  PT FREQUENCY: 1x/week  PT DURATION: 10 weeks  PLANNED INTERVENTIONS: 97110-Therapeutic exercises, 97530- Therapeutic activity, 97112- Neuromuscular re-education, 97535- Self Care, 02859- Manual therapy, [phone]- Gait training, and Patient/Family education.  PLAN FOR NEXT SESSION: Assess BLE MMT, assess how dry needling trial went, light strengthening of low back and gluteal musculature, begin static and dynamic balance activities    Kerry Coleman, Student-PT 06/29/2024, 9:58 AM  "

## 2024-07-04 ENCOUNTER — Ambulatory Visit

## 2024-07-04 DIAGNOSIS — M5416 Radiculopathy, lumbar region: Secondary | ICD-10-CM

## 2024-07-04 DIAGNOSIS — M7918 Myalgia, other site: Secondary | ICD-10-CM

## 2024-07-04 DIAGNOSIS — M5459 Other low back pain: Secondary | ICD-10-CM

## 2024-07-04 DIAGNOSIS — M6281 Muscle weakness (generalized): Secondary | ICD-10-CM

## 2024-07-04 NOTE — Therapy (Signed)
 " OUTPATIENT PHYSICAL THERAPY THORACOLUMBAR TREATMENT   Patient Name: Kerry Coleman MRN: [id number] DOB:[birth year], 89 y.o., female Today's Date: 07/04/2024  END OF SESSION:  PT End of Session - 07/04/24 0926     Visit Number 3    Date for Recertification  08/08/24    Authorization Type Medicare    PT Start Time 0927    PT Stop Time 1008    PT Time Calculation (min) 41 min    Activity Tolerance Patient tolerated treatment well    Behavior During Therapy [facility] for tasks assessed/performed         Past Medical History:  Diagnosis Date   Arthritis    Atrial fibrillation (HCC)    HLD (hyperlipidemia)    HTN (hypertension)    Past Surgical History:  Procedure Laterality Date   BACK SURGERY     BACK SURGERY     10/2022   BREAST BIOPSY Left    @ age 48, Benign    BUNIONECTOMY     CARDIOVERSION N/A 10/29/2023   Procedure: CARDIOVERSION;  Surgeon: Raford Riggs, MD;  Location: [facility] INVASIVE CV LAB;  Service: Cardiovascular;  Laterality: N/A;   CARDIOVERSION N/A 03/14/2024   Procedure: CARDIOVERSION;  Surgeon: Pietro Redell RAMAN, MD;  Location: [facility] INVASIVE CV LAB;  Service: Cardiovascular;  Laterality: N/A;   CARPAL TUNNEL RELEASE Left    KNEE SURGERY     LAMINECTOMY WITH POSTERIOR LATERAL ARTHRODESIS LEVEL 1 Left 12/27/2023   Procedure: Posterior lateral fusion - Lumbar two-three, left Lumbar two-three hemifacetectomy, extension of instrumentation Lumbar two-three;  Surgeon: Joshua Alm Hamilton, MD;  Location: [facility] OR;  Service: Neurosurgery;  Laterality: Left;   right carpal tunnel release     right index finger excision of mass     ROTATOR CUFF REPAIR Left    SPINE SURGERY     TONSILLECTOMY     TOTAL KNEE ARTHROPLASTY Bilateral    Patient Active Problem List   Diagnosis Date Noted   PSVT (paroxysmal supraventricular tachycardia) 04/27/2024   NSVT (nonsustained ventricular tachycardia) (HCC) 04/27/2024   Mixed hyperlipidemia 04/27/2024   S/P lumbar fusion 12/27/2023    Prediabetes 12/10/2023   Chronic atrial fibrillation (HCC) 12/10/2023   Persistent atrial fibrillation (HCC) 10/06/2023   Hypercoagulable state due to persistent atrial fibrillation (HCC) 10/06/2023   Elevated glucose 03/25/2023   Chronic left-sided low back pain 06/24/2022   Worries 09/18/2021   Stressful life events affecting family and household 06/17/2021   Urge incontinence 09/26/2020   Tremor of left hand 09/26/2020   Stress incontinence of urine 10/26/2019   Vitamin D  deficiency 10/26/2019   Osteopenia of lumbar spine 05/08/2019   History of melanoma 04/27/2019   Preop cardiovascular exam 04/27/2019   Estrogen deficiency 04/27/2019   Urinary tract infection with hematuria 03/31/2019   Urinary tract infection without hematuria 03/31/2019   Abnormal urinalysis 03/23/2019   Elevated cholesterol 03/20/2019   Primary hypertension 03/20/2019    PCP: Berneta Elsie Sayre, MD  REFERRING PROVIDER: Joshua Alm Hamilton, MD  REFERRING DIAG: LBP  Rationale for Evaluation and Treatment: Rehabilitation  THERAPY DIAG:  Other low back pain  Radiculopathy, lumbar region  Muscle weakness (generalized)  Gluteal pain  ONSET DATE: May 2025  SUBJECTIVE:  SUBJECTIVE STATEMENT:  Pt reports minimal low back pain today; only minimal soreness in L hip. Pt states joint stiffness is the worst in the morning and she experiences some muscle cramping at night in her legs. Overall pt doing well today.     I have pain in my lower back that radiates a little into my hips but has been pretty central to my lower back straight across. I still have been able to swim and do light activity I am just cautious in my movements; I don't lift heavy things or bend to do much. I have been working on my balance as well and  haven't had any falls in the past year.   PERTINENT HISTORY:  See PMH chart above   PAIN:  Are you having pain? Yes: NPRS scale: 4/10 Pain location: Bilateral superior gluteal  Pain description: discomfort, ache  Aggravating factors: Prolonged standing  Relieving factors: Heat; knees elevated when lying down   PRECAUTIONS: None  RED FLAGS: None   WEIGHT BEARING RESTRICTIONS: No  FALLS:  Has patient fallen in last 6 months? No  LIVING ENVIRONMENT: Lives with: lives with their family Lives in: House/apartment Stairs: Doesn't have to use stairs on the regular  Has following equipment at home: None  OCCUPATION: Retired   PLOF: Independent with basic ADLs  PATIENT GOALS: To be able to dance again and move with better quality without pain   NEXT MD VISIT: January 2026   OBJECTIVE:  Note: Objective measures were completed at Evaluation unless otherwise noted.  DIAGNOSTIC FINDINGS:  Teresa Bottcher I, RT on 12/27/2023 12:41 PM  Surgery: Posterior lateral fusion - L2-L3, left L2-3 hemifacetectomy, extension of instrumentation L2-3  RSTO: BIW    COGNITION: Overall cognitive status: Within functional limits for tasks assessed     SENSATION: WFL   PALPATION: Superior gluteal tightness R and L side   LUMBAR ROM:   AROM eval  Flexion To mid shin with pain   Extension WNL  Right lateral flexion To knee joint with pain   Left lateral flexion To knee joint with pain   Right rotation WNL no pain   Left rotation WNL no pain    (Blank rows = not tested)  LOWER EXTREMITY ROM:     WFL  LOWER EXTREMITY MMT:    MMT Right eval Left eval  Hip flexion WNL WNL  Hip extension    Hip abduction 5/5 4/5  Hip adduction 4/5  4/5  Hip internal rotation    Hip external rotation    Knee flexion 5/5 5/5  Knee extension 5/5 4/5  Ankle dorsiflexion    Ankle plantarflexion    Ankle inversion    Ankle eversion     (Blank rows = not tested)  FUNCTIONAL TESTS:   SPPB:  7/12 Balance - 2 points  Gait Speed - 2 points (7s) Chair test - 3 points (13.5s)  GAIT: Distance walked: In Clinic  Assistive device utilized: None Level of assistance: Complete Independence Comments: Pt with no significant gait deficits   TREATMENT DATE:  07/04/24 Reassesment of SPPB STS 1x10 Static Tandem stance  NuStep L5 x 6 min  Toe Taps 8 2x20 SB Rollouts lower back flexion and lateral rotation   Trigger Point Dry Needling  Pt instructed on Dry Needling rational, procedures, and possible side effects. Pt instructed to expect mild to moderate muscle soreness later in the day and/or into the next day.  Pt instructed in methods to reduce muscle soreness. Pt instructed  to continue prescribed HEP. Because Dry Needling was performed over or adjacent to a lung field, pt was educated on S/S of pneumothorax and to seek immediate medical attention should they occur.  Patient was educated on signs and symptoms of infection and other risk factors and advised to seek medical attention should they occur.  Patient verbalized understanding of these instructions and education.    Patient Verbal Consent Given: Yes Education Handout Provided: Yes Muscles Treated: L gluteal  Treatment Response/Outcome: LTR's Performed and documented by M. Albright, PT    06/27/24 Stability Ball Rollouts for low back  STS 2x10 Standing Hip Abduction 2x10  Toe taps 6 Trigger Point Dry Needling   Pt instructed on Dry Needling rational, procedures, and possible side effects. Pt instructed to expect mild to moderate muscle soreness later in the day and/or into the next day.  Pt instructed in methods to reduce muscle soreness. Pt instructed to continue prescribed HEP. Because Dry Needling was performed over or adjacent to a lung field, pt was educated on S/S of pneumothorax and to seek immediate medical attention should they occur.  Patient was educated on signs and symptoms of infection and other risk  factors and advised to seek medical attention should they occur.  Patient verbalized understanding of these instructions and education.    Patient Verbal Consent Given: Yes Education Handout Provided: Yes Muscles Treated: L gluteal  Treatment Response/Outcome: LTR's Performed and documented by M. Albright, PT    05/30/24-Eval                                                                                                                               Trigger Point Dry Needling   Initial Treatment: Pt instructed on Dry Needling rational, procedures, and possible side effects. Pt instructed to expect mild to moderate muscle soreness later in the day and/or into the next day.  Pt instructed in methods to reduce muscle soreness. Pt instructed to continue prescribed HEP. Because Dry Needling was performed over or adjacent to a lung field, pt was educated on S/S of pneumothorax and to seek immediate medical attention should they occur.  Patient was educated on signs and symptoms of infection and other risk factors and advised to seek medical attention should they occur.  Patient verbalized understanding of these instructions and education.    Patient Verbal Consent Given: Yes Education Handout Provided: Yes Muscles Treated: bilateral upper traps Treatment Response/Outcome: LTR's Performed and documented by M. Obadiah, PT  PATIENT EDUCATION:  Education details: HEP Person educated: Patient Education method: Medical Illustrator Education comprehension: verbalized understanding and returned demonstration  HOME EXERCISE PROGRAM: Access Code: [email or web address] Date: 05/30/2024 Exercises - Standing Shoulder Row with Anchored Resistance  - 1 x daily - 7 x weekly - 2 sets - 10 reps - Shoulder extension with resistance - Neutral  - 1 x daily - 7 x weekly - 2 sets - 10 reps - Standing March with Counter  Support  - 1 x daily - 7 x weekly - 2 sets - 10 reps   ASSESSMENT:  CLINICAL  IMPRESSION:  Pt tolerating current regime well with reports of decreased pain overall in the lower back and hip. Pt progressing well with decreased pain and better mobility as seen by an increased SPPB score today. Pt will benefit from continued functional activity to mitigate joint stiffness and stretching to calm muscle tightness. Pt with good response to dry needling to L gluteal today with some relief in L hip musculature.    Patient is a 89 y.o. female who was seen today for physical therapy evaluation and treatment for lumbar spine radiculopathy into bilateral sides of hip. Pt radicular symptoms seem to be centralizing into the low back/superior gluteal musculature across the low back. Pt with good quality ROM in back just has pain with movements of flex/ext. Pt with some deficits in higher level balance activity and will benefit from static and dynamic balance activities in PT along with light strengthening of low back musculature. Pt participated in dry needling today to alleviate muscle tightness in the superior glutes on the L and R side. Pt tolerated dry needling well with no adverse reactions and will report back in next session.   OBJECTIVE IMPAIRMENTS: decreased endurance, increased muscle spasms, and pain.   ACTIVITY LIMITATIONS: carrying, lifting, bending, and standing  PARTICIPATION LIMITATIONS: Cautious with house chores   PERSONAL FACTORS: Age, Time since onset of injury/illness/exacerbation, and 1-2 comorbidities:   are also affecting patient's functional outcome.   REHAB POTENTIAL: Good  CLINICAL DECISION MAKING: Stable/uncomplicated  EVALUATION COMPLEXITY: Low  GOALS: Goals reviewed with patient? Yes  SHORT TERM GOALS: Target date: 07/04/2024  Pt will complete full initial HEP independently  Baseline: Goal status: IN PROGRESS ~75% performed 07/04/24   LONG TERM GOALS: Target date: 08/08/2024  Pt will be able to hold tandem stance for >15s with L and R LE in front   Baseline:  Goal status: IN PROGRESS 07/04/24  2.  Pt will increase SPPB score to >9  Baseline: 7/12 Goal status: IN PROGRESS 8/12 07/04/24  3.  Pt will be able to report 50% decrease in pain with carryover in quality of ADLs  Baseline:  Goal status: IN PROGRESS 4/10 06/29/24; No pain today MET 07/04/24    PLAN:  PT FREQUENCY: 1x/week  PT DURATION: 10 weeks  PLANNED INTERVENTIONS: 97110-Therapeutic exercises, 97530- Therapeutic activity, 97112- Neuromuscular re-education, 97535- Self Care, 02859- Manual therapy, [phone]- Gait training, and Patient/Family education.  PLAN FOR NEXT SESSION: Assess BLE MMT, assess how dry needling trial went, light strengthening of low back and gluteal musculature, begin static and dynamic balance activities    Thersia Alder, Student-PT 07/04/2024, 10:16 AM  "

## 2024-07-06 ENCOUNTER — Ambulatory Visit

## 2024-07-13 ENCOUNTER — Ambulatory Visit

## 2024-07-13 DIAGNOSIS — M5459 Other low back pain: Secondary | ICD-10-CM

## 2024-07-13 DIAGNOSIS — M6281 Muscle weakness (generalized): Secondary | ICD-10-CM

## 2024-07-13 DIAGNOSIS — M7918 Myalgia, other site: Secondary | ICD-10-CM

## 2024-07-13 DIAGNOSIS — M5416 Radiculopathy, lumbar region: Secondary | ICD-10-CM

## 2024-07-13 NOTE — Therapy (Signed)
 " OUTPATIENT PHYSICAL THERAPY THORACOLUMBAR TREATMENT   Patient Name: Kerry Coleman MRN: [id number] DOB:1934-06-30, 89 y.o., female Today's Date: 07/13/2024  END OF SESSION:  PT End of Session - 07/13/24 1618     Visit Number 4    Date for Recertification  08/08/24    Authorization Type Medicare    PT Start Time 1617    PT Stop Time 1700    PT Time Calculation (min) 43 min    Activity Tolerance Patient tolerated treatment well    Behavior During Therapy [facility] for tasks assessed/performed          Past Medical History:  Diagnosis Date   Arthritis    Atrial fibrillation (HCC)    HLD (hyperlipidemia)    HTN (hypertension)    Past Surgical History:  Procedure Laterality Date   BACK SURGERY     BACK SURGERY     10/2022   BREAST BIOPSY Left    @ age 64, Benign    BUNIONECTOMY     CARDIOVERSION N/A 10/29/2023   Procedure: CARDIOVERSION;  Surgeon: Raford Riggs, MD;  Location: [facility] INVASIVE CV LAB;  Service: Cardiovascular;  Laterality: N/A;   CARDIOVERSION N/A 03/14/2024   Procedure: CARDIOVERSION;  Surgeon: Pietro Redell RAMAN, MD;  Location: [facility] INVASIVE CV LAB;  Service: Cardiovascular;  Laterality: N/A;   CARPAL TUNNEL RELEASE Left    KNEE SURGERY     LAMINECTOMY WITH POSTERIOR LATERAL ARTHRODESIS LEVEL 1 Left 12/27/2023   Procedure: Posterior lateral fusion - Lumbar two-three, left Lumbar two-three hemifacetectomy, extension of instrumentation Lumbar two-three;  Surgeon: Joshua Alm Hamilton, MD;  Location: [facility] OR;  Service: Neurosurgery;  Laterality: Left;   right carpal tunnel release     right index finger excision of mass     ROTATOR CUFF REPAIR Left    SPINE SURGERY     TONSILLECTOMY     TOTAL KNEE ARTHROPLASTY Bilateral    Patient Active Problem List   Diagnosis Date Noted   PSVT (paroxysmal supraventricular tachycardia) 04/27/2024   NSVT (nonsustained ventricular tachycardia) (HCC) 04/27/2024   Mixed hyperlipidemia 04/27/2024   S/P lumbar fusion 12/27/2023    Prediabetes 12/10/2023   Chronic atrial fibrillation (HCC) 12/10/2023   Persistent atrial fibrillation (HCC) 10/06/2023   Hypercoagulable state due to persistent atrial fibrillation (HCC) 10/06/2023   Elevated glucose 03/25/2023   Chronic left-sided low back pain 06/24/2022   Worries 09/18/2021   Stressful life events affecting family and household 06/17/2021   Urge incontinence 09/26/2020   Tremor of left hand 09/26/2020   Stress incontinence of urine 10/26/2019   Vitamin D  deficiency 10/26/2019   Osteopenia of lumbar spine 05/08/2019   History of melanoma 04/27/2019   Preop cardiovascular exam 04/27/2019   Estrogen deficiency 04/27/2019   Urinary tract infection with hematuria 03/31/2019   Urinary tract infection without hematuria 03/31/2019   Abnormal urinalysis 03/23/2019   Elevated cholesterol 03/20/2019   Primary hypertension 03/20/2019    PCP: Berneta Elsie Sayre, MD  REFERRING PROVIDER: Joshua Alm Hamilton, MD  REFERRING DIAG: LBP  Rationale for Evaluation and Treatment: Rehabilitation  THERAPY DIAG:  Gluteal pain  Other low back pain  Radiculopathy, lumbar region  Muscle weakness (generalized)  ONSET DATE: May 2025  SUBJECTIVE:  SUBJECTIVE STATEMENT:  Pt reports 4/10 L hip pain and continued muscle stiffness.      I have pain in my lower back that radiates a little into my hips but has been pretty central to my lower back straight across. I still have been able to swim and do light activity I am just cautious in my movements; I don't lift heavy things or bend to do much. I have been working on my balance as well and haven't had any falls in the past year.   PERTINENT HISTORY:  See PMH chart above   PAIN:  Are you having pain? Yes: NPRS scale: 4/10 Pain location:  Bilateral superior gluteal  Pain description: discomfort, ache  Aggravating factors: Prolonged standing  Relieving factors: Heat; knees elevated when lying down   PRECAUTIONS: None  RED FLAGS: None   WEIGHT BEARING RESTRICTIONS: No  FALLS:  Has patient fallen in last 6 months? No  LIVING ENVIRONMENT: Lives with: lives with their family Lives in: House/apartment Stairs: Doesn't have to use stairs on the regular  Has following equipment at home: None  OCCUPATION: Retired   PLOF: Independent with basic ADLs  PATIENT GOALS: To be able to dance again and move with better quality without pain   NEXT MD VISIT: January 2026   OBJECTIVE:  Note: Objective measures were completed at Evaluation unless otherwise noted.  DIAGNOSTIC FINDINGS:  Teresa Bottcher I, RT on 12/27/2023 12:41 PM  Surgery: Posterior lateral fusion - L2-L3, left L2-3 hemifacetectomy, extension of instrumentation L2-3  RSTO: BIW    COGNITION: Overall cognitive status: Within functional limits for tasks assessed     SENSATION: WFL   PALPATION: Superior gluteal tightness R and L side   LUMBAR ROM:   AROM eval  Flexion To mid shin with pain   Extension WNL  Right lateral flexion To knee joint with pain   Left lateral flexion To knee joint with pain   Right rotation WNL no pain   Left rotation WNL no pain    (Blank rows = not tested)  LOWER EXTREMITY ROM:     WFL  LOWER EXTREMITY MMT:    MMT Right eval Left eval  Hip flexion WNL WNL  Hip extension    Hip abduction 5/5 4/5  Hip adduction 4/5  4/5  Hip internal rotation    Hip external rotation    Knee flexion 5/5 5/5  Knee extension 5/5 4/5  Ankle dorsiflexion    Ankle plantarflexion    Ankle inversion    Ankle eversion     (Blank rows = not tested)  FUNCTIONAL TESTS:   SPPB: 7/12 Balance - 2 points  Gait Speed - 2 points (7s) Chair test - 3 points (13.5s)  GAIT: Distance walked: In Clinic  Assistive device utilized:  None Level of assistance: Complete Independence Comments: Pt with no significant gait deficits   TREATMENT DATE:  07/13/24 NuStep L5 x 6 min  Glute Bridges 2x10 Sidelying Hip abduction 2x10 HS stretch Knee to chest stretch Feet on ball lumbar rotation stretch  STM and STM with T-gun Heat application L gluteal musculature sitting in chair 10 min     07/04/24 Reassesment of SPPB STS 1x10 Static Tandem stance  NuStep L5 x 6 min  Toe Taps 8 2x20 SB Rollouts lower back flexion and lateral rotation   Trigger Point Dry Needling  Pt instructed on Dry Needling rational, procedures, and possible side effects. Pt instructed to expect mild to moderate muscle soreness later in  the day and/or into the next day.  Pt instructed in methods to reduce muscle soreness. Pt instructed to continue prescribed HEP. Because Dry Needling was performed over or adjacent to a lung field, pt was educated on S/S of pneumothorax and to seek immediate medical attention should they occur.  Patient was educated on signs and symptoms of infection and other risk factors and advised to seek medical attention should they occur.  Patient verbalized understanding of these instructions and education.    Patient Verbal Consent Given: Yes Education Handout Provided: Yes Muscles Treated: L gluteal  Treatment Response/Outcome: LTR's Performed and documented by M. Albright, PT    06/27/24 Stability Ball Rollouts for low back  STS 2x10 Standing Hip Abduction 2x10  Toe taps 6 Trigger Point Dry Needling   Pt instructed on Dry Needling rational, procedures, and possible side effects. Pt instructed to expect mild to moderate muscle soreness later in the day and/or into the next day.  Pt instructed in methods to reduce muscle soreness. Pt instructed to continue prescribed HEP. Because Dry Needling was performed over or adjacent to a lung field, pt was educated on S/S of pneumothorax and to seek immediate medical  attention should they occur.  Patient was educated on signs and symptoms of infection and other risk factors and advised to seek medical attention should they occur.  Patient verbalized understanding of these instructions and education.    Patient Verbal Consent Given: Yes Education Handout Provided: Yes Muscles Treated: L gluteal  Treatment Response/Outcome: LTR's Performed and documented by M. Albright, PT    05/30/24-Eval                                                                                                                               Trigger Point Dry Needling   Initial Treatment: Pt instructed on Dry Needling rational, procedures, and possible side effects. Pt instructed to expect mild to moderate muscle soreness later in the day and/or into the next day.  Pt instructed in methods to reduce muscle soreness. Pt instructed to continue prescribed HEP. Because Dry Needling was performed over or adjacent to a lung field, pt was educated on S/S of pneumothorax and to seek immediate medical attention should they occur.  Patient was educated on signs and symptoms of infection and other risk factors and advised to seek medical attention should they occur.  Patient verbalized understanding of these instructions and education.    Patient Verbal Consent Given: Yes Education Handout Provided: Yes Muscles Treated: bilateral upper traps Treatment Response/Outcome: LTR's Performed and documented by M. Obadiah, PT  PATIENT EDUCATION:  Education details: HEP Person educated: Patient Education method: Medical Illustrator Education comprehension: verbalized understanding and returned demonstration  HOME EXERCISE PROGRAM: Access Code: [email or web address] Date: 05/30/2024 Exercises - Standing Shoulder Row with Anchored Resistance  - 1 x daily - 7 x weekly - 2 sets - 10 reps - Shoulder extension with resistance - Neutral  -  1 x daily - 7 x weekly - 2 sets - 10 reps - Standing March with  Counter Support  - 1 x daily - 7 x weekly - 2 sets - 10 reps   ASSESSMENT:  CLINICAL IMPRESSION:  Pt still feeling some of the same symptoms of tightness in the L hip. Pt states things are remaining the same. Trialed some different modalities at the end of session today. Session today focused on decreasing pain and loosening tight musculature. Stretching, light activity, and manual therapy seemed to alleviate some symptoms today. Pt will benefit from continued functional activity to mitigate joint stiffness and stretching to calm muscle tightness. Pt with good response to STM via manual and T-gun to L gluteal today along with some heat aided in some relief in L hip musculature.    Patient is a 89 y.o. female who was seen today for physical therapy evaluation and treatment for lumbar spine radiculopathy into bilateral sides of hip. Pt radicular symptoms seem to be centralizing into the low back/superior gluteal musculature across the low back. Pt with good quality ROM in back just has pain with movements of flex/ext. Pt with some deficits in higher level balance activity and will benefit from static and dynamic balance activities in PT along with light strengthening of low back musculature. Pt participated in dry needling today to alleviate muscle tightness in the superior glutes on the L and R side. Pt tolerated dry needling well with no adverse reactions and will report back in next session.   OBJECTIVE IMPAIRMENTS: decreased endurance, increased muscle spasms, and pain.   ACTIVITY LIMITATIONS: carrying, lifting, bending, and standing  PARTICIPATION LIMITATIONS: Cautious with house chores   PERSONAL FACTORS: Age, Time since onset of injury/illness/exacerbation, and 1-2 comorbidities:   are also affecting patient's functional outcome.   REHAB POTENTIAL: Good  CLINICAL DECISION MAKING: Stable/uncomplicated  EVALUATION COMPLEXITY: Low  GOALS: Goals reviewed with patient? Yes  SHORT TERM  GOALS: Target date: 07/04/2024  Pt will complete full initial HEP independently  Baseline: Goal status: IN PROGRESS ~50% performed 07/13/24   LONG TERM GOALS: Target date: 08/08/2024  Pt will be able to hold tandem stance for >15s with L and R LE in front  Baseline:  Goal status: IN PROGRESS 07/04/24  2.  Pt will increase SPPB score to >9  Baseline: 7/12 Goal status: IN PROGRESS 8/12 07/04/24  3.  Pt will be able to report 50% decrease in pain with carryover in quality of ADLs  Baseline:  Goal status: IN PROGRESS 4/10 06/29/24; No pain today MET 07/04/24; 4/10 today regressed 07/13/24    PLAN:  PT FREQUENCY: 1x/week  PT DURATION: 10 weeks  PLANNED INTERVENTIONS: 97110-Therapeutic exercises, 97530- Therapeutic activity, 97112- Neuromuscular re-education, 97535- Self Care, [id number]- Manual therapy, [phone]- Gait training, and Patient/Family education.  PLAN FOR NEXT SESSION: Assess BLE MMT, assess how dry needling trial went, light strengthening of low back and gluteal musculature, begin static and dynamic balance activities   Thersia Alder, Student-PT 07/13/2024, 5:47 PM  "

## 2024-07-27 ENCOUNTER — Ambulatory Visit

## 2024-08-14 ENCOUNTER — Ambulatory Visit: Payer: Medicare Other

## 2024-09-29 ENCOUNTER — Ambulatory Visit: Admitting: Family Medicine
